# Patient Record
Sex: Male | Born: 1990 | Race: Black or African American | Hispanic: No | Marital: Single | Smoking: Never smoker
Health system: Southern US, Community
[De-identification: ages and names within clinical notes are randomized; demographics above are authoritative.]

## PROBLEM LIST (undated history)

## (undated) DIAGNOSIS — F319 Bipolar disorder, unspecified: Secondary | ICD-10-CM

## (undated) DIAGNOSIS — F32A Depression, unspecified: Secondary | ICD-10-CM

## (undated) DIAGNOSIS — F419 Anxiety disorder, unspecified: Secondary | ICD-10-CM

## (undated) DIAGNOSIS — F909 Attention-deficit hyperactivity disorder, unspecified type: Secondary | ICD-10-CM

## (undated) DIAGNOSIS — I2699 Other pulmonary embolism without acute cor pulmonale: Secondary | ICD-10-CM

## (undated) DIAGNOSIS — F329 Major depressive disorder, single episode, unspecified: Secondary | ICD-10-CM

## (undated) DIAGNOSIS — T7840XA Allergy, unspecified, initial encounter: Secondary | ICD-10-CM

## (undated) DIAGNOSIS — Q796 Ehlers-Danlos syndrome, unspecified: Secondary | ICD-10-CM

## (undated) DIAGNOSIS — F988 Other specified behavioral and emotional disorders with onset usually occurring in childhood and adolescence: Secondary | ICD-10-CM

## (undated) DIAGNOSIS — J45909 Unspecified asthma, uncomplicated: Secondary | ICD-10-CM

## (undated) DIAGNOSIS — M1990 Unspecified osteoarthritis, unspecified site: Secondary | ICD-10-CM

## (undated) HISTORY — PX: NO PAST SURGERIES: SHX2092

## (undated) HISTORY — DX: Unspecified asthma, uncomplicated: J45.909

## (undated) HISTORY — DX: Attention-deficit hyperactivity disorder, unspecified type: F90.9

## (undated) HISTORY — DX: Anxiety disorder, unspecified: F41.9

## (undated) HISTORY — DX: Depression, unspecified: F32.A

## (undated) HISTORY — DX: Allergy, unspecified, initial encounter: T78.40XA

## (undated) HISTORY — DX: Major depressive disorder, single episode, unspecified: F32.9

## (undated) HISTORY — DX: Other specified behavioral and emotional disorders with onset usually occurring in childhood and adolescence: F98.8

---
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
## 2009-05-15 ENCOUNTER — Telehealth: Payer: Self-pay | Admitting: Family Medicine

## 2009-05-19 ENCOUNTER — Ambulatory Visit: Payer: Self-pay | Admitting: Family Medicine

## 2011-02-05 NOTE — H&P (Signed)
NAME:  [name], [name] NO.:  [id number]   MEDICAL RECORD NO.:  [id number]          PATIENT TYPE:  INP   LOCATION:  0205                          FACILITY:  [facility]   PHYSICIAN:  Lalla Brothers, MDDATE OF BIRTH:  1991-01-11   DATE OF ADMISSION:  08/16/2006  DATE OF DISCHARGE:                         PSYCHIATRIC ADMISSION ASSESSMENT   IDENTIFICATION:  Isaiah Garza is a 20 year old male, 11th grade student at [employer], admitted emergently voluntarily in transfer from [facility] Emergency Department for inpatient stabilization and treatment of  psychosis, dangerous to self and unable to meet his basic needs.  The  patient is overwhelmed, failing to sleep for the last two days as he  continues to attempt to understand and solve the delusional doubt extending  from identity to basic truth and safety.  He is self-defeating in this  regard and has been treating himself with cannabis, reporting that he  attempted to stop for father for a week, apparently unsuccessful.  His urine  drug screen is positive for cannabis on admission and he denies other drugs  initially.  He has been noncompliant with his Risperdal from Dr.  Elsie Saas for the last two weeks, being in treatment since the spring of  2007.   HISTORY OF PRESENT ILLNESS:  The patient cannot complete a conversation to  the point of answering fully questions about problems.  He is appreciative  that others are attempting to help but he becomes confused as he attempts to  continue clarification of signs or symptoms.  Patient has a history of ADHD  but is on no specific treatment for that at this time.  He had bed-wetting  until age 45.  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 Colen Darling.  However, he is  obviously unable to function at school currently.  He reports recent panic  anxiety with limited clarification of degree in  consequences.  His mood has  been inappropriate and father indicates that he has had an outpatient  diagnosis of bipolar mania.  The patient presents in somewhat of a partially  treated state, having received Risperdal in the past.  The family reports  that the Risperdal has been dosed as 20 mg in the morning and 10 mg at night  though they are likely referring to 2 mg in the morning and 1 mg at night.  The patient had no definite side effects from the medication and family  seems to think it was helping though he became noncompliant as he began  questioning the extent and degree of need.  He has had no organic central  nervous system trauma.  He asked for help sleeping.  He has had suicidal  ideation in the past, wanting to be dead.  He does not acknowledge homicidal  ideation.  They do not clarify the extent of treatment with Dr. Elsie Saas  at [phone].  Cannabis has been daily, trying to quit for father for the  last week.  There is no other known psychic trauma.  He denies flashbacks,  character disruption or alter identities.  There has been no definite  association or time lapse.  However, he cannot clarify the definite details  of the course of delusions but does indicate that he has some voices at  times also.   PAST MEDICAL HISTORY:  He is under the primary care of Dr. Benard Halsted.  The patient has assumed a vegetarian status.  He had a CT scan in the ED  that was negative at the time of transfer.  Labs were otherwise negative  except for the urine drug screen being positive for cannabis.  He was  delivered at [redacted] weeks gestation at 5 pounds 2 ounces without complications  known.  He had allergic rhinitis and asthma in the past though without  relapse of asthma in years.  He has contact lenses for light sensitivity.  He fell several years ago but does not clarify the reason except to state  that he has lax joints and sometimes takes Naprosyn for joint pain.  He has  marfanoid  features.  He has orthodontic braces.  He has acne of the upper  back.  He has multiple nevi.  He is allergic to DUST and ANIMAL DANDER.  He  had bed-wetting until age 85.  He has had no seizure or syncope.  There has  been no heart murmur or arrhythmia.  He has no medication allergies.   REVIEW OF SYSTEMS:  The patient denies difficulty with gait, gaze or  continence.  He denies exposure to communicable disease or toxins.  He  denies headache or sensory loss otherwise.  There is no memory loss or  coordination difficulty.  There is no cough, congestion, dyspnea or chest  pain currently.  There is no abdominal pain, nausea, vomiting or diarrhea.  There is no dysuria or arthralgia.   IMMUNIZATIONS:  Up-to-date.   FAMILY HISTORY:  Great-aunt completed suicide.  The patient lives with both  parents, maternal grandmother, age 85, and 22 year old sister.  The father  reports a family history of bipolar disorder but is not more specific.  The  patient declines the support parents offer.  Father seems somewhat  disorganized as he attempts to help the patient initially though all are  sleep deprived.   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  He enjoys drama and dance including at church.  He has  been using cannabis even daily.  He denies other drugs at this time.  He  denies legal consequences.  He does not answer questions about sexual  activity.   ASSETS:  The patient has preserved intent and interest as well as  motivation.   MENTAL STATUS EXAM:  Height is 73-1/2 inches and weight is 56-1/2 kg.  Blood  pressure is 142/89 with heart rate of 78 (sitting) and 147/96 with heart  rate of 96 (standing).  He is right-handed.  He is alert and somewhat  vigilant.  He is over-sensitive and easily anxious.  His mood is labile but  not fixated in mania or depression though he tends toward more elevated mood historically.  He presents poorly  formed delusions about opening a door into  his subconscious that cannot be closed.  He goes over and over through  intrapsychic processing the consequences and never-ending mechanism.  He  states he is attempting to understand himself and his life and is getting  more and more confused.  He does not present frank organicity.  There is no  definite dissociation.  He does not present definite Garza-traumatic stress.  He does have vague auditory hallucinations at times.  He has had suicidal  ideation in the past.  He is not able to meet his basic needs currently and  his delusions render him at risk for both physical as well as emotional  collapse and injury.   IMPRESSION:  AXIS I:  Psychotic disorder not otherwise specified.  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  Generalized anxiety disorder.  History of  attention-deficit hyperactivity disorder not otherwise specified.  History  of functional nocturnal enuresis.  Parent-child problem.  Other specified  family circumstances.  AXIS II:  Diagnosis deferred.  AXIS III:  Marfanoid features with lax joints, sometimes painful and a  history of at least one fall, orthodontics, acne, photosensitivity, history  of allergic rhinitis and asthma, especially to DUST and ANIMAL DANDER.  AXIS IV:  Stressors:  Family--moderate, acute and chronic; school--moderate,  acute and chronic; phase of life--severe, acute and chronic.  AXIS V:  GAF on admission 27; highest in last year 74.   PLAN:  The patient is admitted for inpatient adolescent psychiatric and  multidisciplinary multimodal behavioral health treatment in a team-based  program at a locked psychiatric unit.  Will increase Risperdal to 2 mg in  the morning and 4 mg at bedtime.  Ativan is processed with father and he  agrees to 1-2 mg every four hours as needed for agitation, insomnia or  anxiety.  Cognitive behavioral therapy, anger management, desensitization,  identity consolidation, substance abuse  intervention, family therapy, ego  analytic therapy, school or social and communication skills will be  undertaken.   ESTIMATED LENGTH OF STAY:  Six to seven days with target symptoms for  discharge being stabilization of psychosis with reestablishment of the  capacity to meet basic needs, stabilization of safe, effective behavior and  generalization of the capacity for safe, effective participation in  outpatient treatment.      Lalla Brothers, MD  Electronically Signed     GEJ/MEDQ  D:  08/16/2006  T:  08/16/2006  Job:  [id number]

## 2011-02-05 NOTE — Discharge Summary (Signed)
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  The patient  had been unable to sleep for 48 hours and was becoming progressively  decompensated such that father was seeking assistance with his sleep in  the emergency department.  The patient had reportedly contracted with  father to stop cannabis for a week but had been unsuccessful.  He was  noncompliant with Risperdal from Dr. Elsie Saas for two weeks though  the medication had been helping since Aprilof 2007.  For full  details, please see the typed admission assessment.   SYNOPSIS OF PRESENT ILLNESS:  The patient resides with both parents,  sister, and maternal grandmother who is 63 years of age.  Father  anxiously enables the patient's disruptive behavior while mother is more  consistent affectively, particularly for recognizing the patient's  needs.  Mother recognized the patient does not intend to stop cannabis.  They report epiphanies referring to the patient's delusional fixation  and having unlocked the door to his subconscious and being unable to  close the door again.  The patient reports a continuous stream of  thought with each subsequent thought doubting the previous thought so  that his concern never ends.  The patient does not trust himself but  does not trust others.  He  maintains entitlement to smoke cannabis daily  as he has been.  Great-aunt committed suicide.  Bipolar disorder runs in  maternal grandmother and maternal aunt and there are many maternal  relatives with alcoholism.  Father seems anxious though enabling while  mother is avoidant though sincere about the patient needing to change.  The patient erroneously reports taking 20 mg of Risperdal in the morning  and 10 mg at night though he likely seems to suggest that most recently  he has taken the Risperdal as 1 mg morning and night.  CT scan in the ED  was negative at the time of transfer and laboratory testing was positive  only for cannabis.  He was premature birth at 102 weeks gestation,  weighing 5 pounds 2 ounces.  He has orthodontic braces and lax joints  with a marfanoid habitus.  He has orthodontic braces and is allergic to  DUST and ANIMAL DANDER.  He had bed-wetting until age 58.   INITIAL MENTAL STATUS EXAM:  The patient was oversensitive and easily  anxious.  He is right-handed.  He has poorly formed delusions but cannot  disengage from them.  He does not tolerate confrontation and  becomes  sleepless and over-agitated.  He has suicidal ideation in the past and  currently cannot meet his own basic needs, being at risk from his  delusions for both physical and emotional collapse and injury.   LABORATORY FINDINGS:  Venous pH was 7.361 with bicarbonate elevated at  28.  Hemoglobin was normal at 15.5, total white count 6900, MCV of 86  and platelet count 259,000 after hemoglobin was slightly elevated in the  ED at 16.7.  Urine drug screen was positive for tetrahydrocannabinol;  otherwise negative.  Blood alcohol was negative.  Alkaline phosphatase  was 183 with upper limit of normal 171 for adult norms suggesting some  remaining growth.  AST was normal at 19, ALT 11, total bilirubin at 1  and albumin 4.1.  Creatinine was normal at 0.8, sodium 140, potassium  3.3, slightly low, with  random glucose 108 and GGT 15.  Urinalysis was  normal with specific gravity of 1.027 except protein of 30 mg/dL in a  concentrated specimen and pH 6.5 with 0-2 wbc and rbc and few sperm  present.  Free T4 was normal at 1.39 and TSH at 1.896.  10-hour fasting  lipid panel revealed total cholesterol normal at 121, HDL 55, LDL 60 and  triglyceride 28.  Urine probe for gonorrhea and chlamydia trachomatis by  DNA amplification were both negative.  RPR was nonreactive.  Four days  prior to discharge, repeat laboratory testing on Risperdal 6 mg daily  when the patient had some mild parkinsonian hypokinesia and bradykinesia  as well as some orthostatic dizziness and drowsiness, laboratory testing  was intact except fasting glucose was 108 with upper limit of normal 99  and BUN 5 with lower limit of normal 6.  Sodium was normal at 140,  potassium 4.1, creatinine 0.91, calcium 9.8, albumin 4.1, AST 17 and ALT  12.  Hemoglobin A1C was normal at 5.8% with upper limit of normal 6.1  and lipase was normal at 25.  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   HOSPITAL COURSE AND TREATMENT:  General medical exam by Jorje Guild PA-C  noted animal dander with a possible concussion at age 39.  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  The patient reported maternal aunt has bipolar  and father depression.  There is a family history of substance abuse but  they do not provide specifics.  Marfanoid features were evident with lax  joints.  He reports a history of some vague blackouts and clumsiness.  He denies sexual activity.  Vital signs were normal throughout hospital  stay with height 73-1/2 inches and weight of 56-1/2 kg on admission. Initial blood  pressure was 142/89 with heart rate of 78 (sitting) and  147/96 with heart rate of 96 (standing).  Discharge weight was 56 kg and  discharge blood pressure 112/67 with heart rate of 82 (supine) and  121/70 with heart rate of 104 (standing).  The patient was inhibited and  passively resistant in the treatment program.  As he stayed to himself,  he became even more overwhelmed and infatuated with his delusions.  As  Risperdal was titrated up, the delusions began to clear but the patient  also had some cognitive slowing that parents disapproved of  as well as  some hypokinesia and bradykinesia.  On lower doses of Risperdal, he  could not sleep but he would not ask for Ativan regularly to facilitate  sleep thought it did help early in the course of the hospital stay.  Ultimately, a fixed schedule of medication was concluded with Risperdal  3 mg at bedtime, Cogentin 2 mg, and Ativan 2 mg.  Over the final three  days of hospital stay, the patient became more appropriate in all  aspects of care including with family.  He remained resistant to  cessation of marijuana but father became more capable of actively  clarifying parental expectations that he be sober and follow the rules  and the law.  Mother clarified the need for random drug screens and the  family plan for such though the patient did not like this.  Substance  abuse interventions and family therapy was carried out during the  hospital stay.  Ensure nutritional supplements were undertaken  particularly as the patient is vegetarian.  He did make progress in his  nutrition and in his behavior.  He wrote parents a letter explaining his  thoughts as having too much going on to explain.  Parents wanted early  discharge, especially father though it did allow expectations that the  patient become compliant and interactive to prepare for such.  Extrapyramidal side effects were significantly reduced as was anxiety by  the time of discharge.  He  still had some delusional fixations but was  not floridly disorganized or acting upon such in his behavior.  He  required no seclusion or restraint during the hospital stay.  He  manifested more anxiety and brief psychotic disorder than mania during  the hospital stay.   FINAL DIAGNOSES:  AXIS I:  Brief psychotic disorder.  Generalized  anxiety disorder.  Cannabis abuse with possible dependence.  Parent-  child problem.  Other specified family circumstances.  Other  interpersonal problems.  History of functional nocturnal enuresis.  History of attention-deficit hyperactivity disorder not otherwise  specified.  Noncompliance with treatment.  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  AXIS IV:  Stressors:  Family--moderate, acute and chronic; school--  moderate, acute and chronic; phase of life--severe, acute and chronic.  AXIS V:  GAF on admission 27; highest in last year 74; discharge GAF 46.   CONDITION ON DISCHARGE:  The patient was discharged to father in  improved condition following a weight gain refeeding diet.  He has no  restrictions on physical activity and crisis and safety plans are  outlined if needed.  Return to school is contingent upon further  recovery despite stressors of return to the community and the family  environment.  He is discharged on the following medication.   DISCHARGE MEDICATIONS:  1. Risperdal 3 mg tablet every bedtime; quantity #30 with no refill       prescribed.  2. Cogentin 2 mg tablet every bedtime; quantity #30 with no refill.  3. Ativan 2 mg tablet every bedtime; quantity #30 with no refill  prescribed.   FOLLOWUP:  He has aftercare psychiatric follow-up with Dr. Elsie Saas  at [facility] September 02, 2006 at 1645.  Community support referrals are outlined for [facility] at [phone] or [facility] of the [place] at [phone].  Random urine drug screens are outlined though needing to be  finalized with aftercare therapy center or physician in order for  results of the reports to be implemented.  The patient and father  understand the expectation of absolute sobriety.      Lalla Brothers, MD  Electronically Signed     GEJ/MEDQ  D:  08/26/2006  T:  08/26/2006  Job:  [id number]

## 2012-12-20 ENCOUNTER — Telehealth: Payer: Self-pay | Admitting: Family Medicine

## 2012-12-20 NOTE — Telephone Encounter (Signed)
Per patients mother he would like to switch from Dr. Tawanna Cooler to [facility], Please advise

## 2012-12-21 NOTE — Telephone Encounter (Signed)
Fleet Contras please call it's fine if he switches to another physician with his medical problems I recommend he have a physician as his primary care doctor

## 2012-12-21 NOTE — Telephone Encounter (Signed)
Ok with me 

## 2012-12-26 NOTE — Telephone Encounter (Signed)
Left message for patient to call back and schedule with [facility]

## 2012-12-29 NOTE — Telephone Encounter (Signed)
Left message for pt to call back and schedule

## 2013-04-23 ENCOUNTER — Emergency Department (HOSPITAL_COMMUNITY)
Admission: EM | Admit: 2013-04-23 | Discharge: 2013-04-23 | Disposition: A | Discharge status: Home / Self Care | Payer: BC Managed Care – PPO | Attending: Emergency Medicine | Admitting: Emergency Medicine

## 2013-04-23 ENCOUNTER — Emergency Department (HOSPITAL_COMMUNITY): Payer: BC Managed Care – PPO

## 2013-04-23 ENCOUNTER — Encounter: Payer: Self-pay | Admitting: Internal Medicine

## 2013-04-23 ENCOUNTER — Encounter (HOSPITAL_COMMUNITY): Payer: Self-pay

## 2013-04-23 ENCOUNTER — Telehealth: Payer: Self-pay | Admitting: Family Medicine

## 2013-04-23 ENCOUNTER — Ambulatory Visit (INDEPENDENT_AMBULATORY_CARE_PROVIDER_SITE_OTHER): Payer: BC Managed Care – PPO | Admitting: Internal Medicine

## 2013-04-23 VITALS — BP 112/72 | HR 111 | Temp 98.8°F | Wt 146.8 lb

## 2013-04-23 DIAGNOSIS — S0101XA Laceration without foreign body of scalp, initial encounter: Secondary | ICD-10-CM

## 2013-04-23 DIAGNOSIS — Z87891 Personal history of nicotine dependence: Secondary | ICD-10-CM | POA: Insufficient documentation

## 2013-04-23 DIAGNOSIS — W1692XA Jumping or diving into unspecified water causing other injury, initial encounter: Secondary | ICD-10-CM | POA: Insufficient documentation

## 2013-04-23 DIAGNOSIS — S0191XD Laceration without foreign body of unspecified part of head, subsequent encounter: Secondary | ICD-10-CM

## 2013-04-23 DIAGNOSIS — Z79899 Other long term (current) drug therapy: Secondary | ICD-10-CM | POA: Insufficient documentation

## 2013-04-23 DIAGNOSIS — M542 Cervicalgia: Secondary | ICD-10-CM

## 2013-04-23 DIAGNOSIS — S060X9A Concussion with loss of consciousness of unspecified duration, initial encounter: Secondary | ICD-10-CM

## 2013-04-23 DIAGNOSIS — Z5189 Encounter for other specified aftercare: Secondary | ICD-10-CM

## 2013-04-23 DIAGNOSIS — Y9319 Activity, other involving water and watercraft: Secondary | ICD-10-CM | POA: Insufficient documentation

## 2013-04-23 DIAGNOSIS — Y9239 Other specified sports and athletic area as the place of occurrence of the external cause: Secondary | ICD-10-CM | POA: Insufficient documentation

## 2013-04-23 DIAGNOSIS — S0990XA Unspecified injury of head, initial encounter: Secondary | ICD-10-CM

## 2013-04-23 DIAGNOSIS — R112 Nausea with vomiting, unspecified: Secondary | ICD-10-CM | POA: Insufficient documentation

## 2013-04-23 DIAGNOSIS — S0100XA Unspecified open wound of scalp, initial encounter: Secondary | ICD-10-CM | POA: Insufficient documentation

## 2013-04-23 LAB — APTT: aPTT: 23 seconds — ABNORMAL LOW (ref 24–37)

## 2013-04-23 LAB — BASIC METABOLIC PANEL
BUN: 12 mg/dL (ref 6–23)
CO2: 23 mEq/L (ref 19–32)
Chloride: 101 mEq/L (ref 96–112)
Glucose, Bld: 142 mg/dL — ABNORMAL HIGH (ref 70–99)
Potassium: 3.2 mEq/L — ABNORMAL LOW (ref 3.5–5.1)
Sodium: 138 mEq/L (ref 135–145)

## 2013-04-23 LAB — CBC WITH DIFFERENTIAL/PLATELET
Hemoglobin: 15.8 g/dL (ref 13.0–17.0)
Lymphocytes Relative: 43 % (ref 12–46)
Lymphs Abs: 3.8 10*3/uL (ref 0.7–4.0)
MCH: 28.3 pg (ref 26.0–34.0)
Monocytes Relative: 7 % (ref 3–12)
Neutro Abs: 4.3 10*3/uL (ref 1.7–7.7)
Neutrophils Relative %: 48 % (ref 43–77)
Platelets: 286 10*3/uL (ref 150–400)
RBC: 5.58 MIL/uL (ref 4.22–5.81)
WBC: 8.8 10*3/uL (ref 4.0–10.5)

## 2013-04-23 LAB — PROTIME-INR
INR: 0.92 (ref 0.00–1.49)
Prothrombin Time: 12.2 seconds (ref 11.6–15.2)

## 2013-04-23 MED ORDER — ONDANSETRON HCL 4 MG/2ML IJ SOLN
4.0000 mg | Freq: Once | INTRAMUSCULAR | Status: AC
  Administered 2013-04-23: 4 mg via INTRAVENOUS

## 2013-04-23 MED ORDER — TRAMADOL HCL 50 MG PO TABS: 50.0000 mg | ORAL_TABLET | Freq: Three times a day (TID) | ORAL | Status: DC | PRN

## 2013-04-23 MED ORDER — CYCLOBENZAPRINE HCL 10 MG PO TABS: 10.0000 mg | ORAL_TABLET | Freq: Three times a day (TID) | ORAL | Status: DC | PRN

## 2013-04-23 NOTE — Patient Instructions (Signed)
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
## 2013-04-23 NOTE — Telephone Encounter (Signed)
Pt is requesting to switch from Dr. Tawanna Cooler to [facility].

## 2013-04-23 NOTE — Telephone Encounter (Signed)
Ok with me 

## 2013-04-23 NOTE — Addendum Note (Signed)
Addended by: Lorre Munroe on: 04/23/2013 01:47 PM   Modules accepted: Orders
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
## 2013-04-23 NOTE — Telephone Encounter (Signed)
Ok with Dr. Tawanna Cooler per April 2 phone note.

## 2013-05-02 ENCOUNTER — Ambulatory Visit (INDEPENDENT_AMBULATORY_CARE_PROVIDER_SITE_OTHER): Payer: BC Managed Care – PPO | Admitting: Internal Medicine

## 2013-05-02 ENCOUNTER — Encounter: Payer: Self-pay | Admitting: Internal Medicine

## 2013-05-02 VITALS — BP 122/80 | HR 88 | Temp 97.8°F | Wt 148.4 lb

## 2013-05-02 DIAGNOSIS — Z4802 Encounter for removal of sutures: Secondary | ICD-10-CM

## 2013-05-02 DIAGNOSIS — S0191XD Laceration without foreign body of unspecified part of head, subsequent encounter: Secondary | ICD-10-CM

## 2013-05-02 DIAGNOSIS — Z0000 Encounter for general adult medical examination without abnormal findings: Secondary | ICD-10-CM

## 2013-05-02 DIAGNOSIS — Z5189 Encounter for other specified aftercare: Secondary | ICD-10-CM

## 2013-05-02 NOTE — Patient Instructions (Signed)
Suture Removal  Your caregiver has removed your sutures today. If skin adhesive strips were applied at the time of suturing, or applied following removal of the sutures today, they will begin to peel off in a couple more days. If skin adhesive strips remain after 14 days, they may be removed.  HOME CARE INSTRUCTIONS    Change any bandages (dressings) at least once a day or as directed by your caregiver. If the bandage sticks, soak it off with warm, soapy water.   Wash the area with soap and water to remove all the cream or ointment (if you were instructed to use any) 2 times a day. Rinse off the soap and pat the area dry with a clean towel.   Reapply cream or ointment as directed by your caregiver. This will help prevent infection and keep the bandage from sticking.   Keep the wound area dry and clean. If the bandage becomes wet, dirty, or develops a bad smell, change it as soon as possible.   Only take over-the-counter or prescription medicines for pain, discomfort, or fever as directed by your caregiver.   Use sunscreen when out in the sun. New scars become sunburned easily.   Return to your caregivers office in in 7 days or as directed to have your sutures removed.  You may need a tetanus shot if:   You cannot remember when you had your last tetanus shot.   You have never had a tetanus shot.   The injury broke your skin.  If you got a tetanus shot, your arm may swell, get red, and feel warm to the touch. This is common and not a problem. If you need a tetanus shot and you choose not to have one, there is a rare chance of getting tetanus. Sickness from tetanus can be serious.  SEEK IMMEDIATE MEDICAL CARE IF:    There is redness, swelling, or increasing pain in the wound.   Pus is coming from the wound.   An unexplained oral temperature above 102 F (38.9 C) develops.   You notice a bad smell coming from the wound or dressing.   The wound breaks open (edges not staying together) after sutures have  been removed.  Document Released: 06/01/2001 Document Revised: 11/29/2011 Document Reviewed: 07/31/2007  ExitCare Patient Information 2014 [employer].

## 2013-05-02 NOTE — Progress Notes (Signed)
HPI  Pt presents to the clinic today to establish care. He is transferring care from Dr. Nelida Meuse. He has some concerns about a nodule on his ear. It just popped up. It is tender but is not overly painful.  Flu: never Tetanus: UTD Dentist: yearly  Past Medical History  Diagnosis Date  . Allergy   . Asthma     Current Outpatient Prescriptions  Medication Sig Dispense Refill  . amphetamine-dextroamphetamine (ADDERALL) 30 MG tablet Take 15-30 mg by mouth 2 (two) times daily. Pt stated usually only takes 15 mg    But has not been taking it regularly      . traMADol (ULTRAM) 50 MG tablet Take 1 tablet (50 mg total) by mouth every 8 (eight) hours as needed for pain.  30 tablet  0   No current facility-administered medications for this visit.    No Known Allergies  Family History  Problem Relation Age of Onset  . Cancer Father     testicular  . Diabetes Neg Hx   . Heart disease Neg Hx   . Early death Neg Hx   . Stroke Neg Hx     History   Social History  . Marital Status: Married    Spouse Name: N/A    Number of Children: N/A  . Years of Education: N/A   Occupational History  . Not on file.   Social History Main Topics  . Smoking status: Former Games developer  . Smokeless tobacco: Not on file  . Alcohol Use: 1.2 oz/week    1 Cans of beer, 1 Shots of liquor per week     Comment: social  . Drug Use: No  . Sexual Activity: Yes   Other Topics Concern  . Not on file   Social History Narrative  . No narrative on file    ROS:  Constitutional: Denies fever, malaise, fatigue, headache or abrupt weight changes.  HEENT: Denies eye pain, eye redness, ear pain, ringing in the ears, wax buildup, runny nose, nasal congestion, bloody nose, or sore throat. Respiratory: Denies difficulty breathing, shortness of breath, cough or sputum production.   Cardiovascular: Denies chest pain, chest tightness, palpitations or swelling in the hands or feet.  Gastrointestinal: Denies abdominal  pain, bloating, constipation, diarrhea or blood in the stool.  GU: Denies frequency, urgency, pain with urination, blood in urine, odor or discharge. Musculoskeletal: Denies decrease in range of motion, difficulty with gait, muscle pain or joint pain and swelling.  Skin: Pt reports lac to head. Denies redness, rashes, lesions or ulcercations.  Neurological: Denies dizziness, difficulty with memory, difficulty with speech or problems with balance and coordination.   No other specific complaints in a complete review of systems (except as listed in HPI above).  PE:  BP 122/80  Pulse 88  Temp(Src) 97.8 F (36.6 C) (Oral)  Wt 148 lb 6.4 oz (67.314 kg)  BMI 18.55 kg/m2  SpO2 98% Wt Readings from Last 3 Encounters:  05/02/13 148 lb 6.4 oz (67.314 kg)  04/23/13 146 lb 12.8 oz (66.588 kg)  05/19/09 129 lb (58.514 kg) (12%*, Z = -1.16)   * Growth percentiles are based on CDC 2-20 Years data.    General: Appears his stated age, well developed, well nourished in NAD. Skin: large head lac, healed, with 15 sutures and 12 staples. HEENT: Head: normal shape and size; Eyes: sclera white, no icterus, conjunctiva pink, PERRLA and EOMs intact; Ears: Tm's gray and intact, normal light reflex, elastotic nodule of right pinna;  Nose: mucosa pink and moist, septum midline; Throat/Mouth: Teeth present, mucosa pink and moist, no lesions or ulcerations noted.  Neck: Normal range of motion. Neck supple, trachea midline. No massses, lumps or thyromegaly present.  Cardiovascular: Normal rate and rhythm. S1,S2 noted.  No murmur, rubs or gallops noted. No JVD or BLE edema. No carotid bruits noted. Pulmonary/Chest: Normal effort and positive vesicular breath sounds. No respiratory distress. No wheezes, rales or ronchi noted.  Abdomen: Soft and nontender. Normal bowel sounds, no bruits noted. No distention or masses noted. Liver, spleen and kidneys non palpable. Musculoskeletal: Normal range of motion. No signs of  joint swelling. No difficulty with gait.  Neurological: Alert and oriented. Cranial nerves II-XII intact. Coordination normal. +DTRs bilaterally. Psychiatric: Mood and affect normal. Behavior is normal. Judgment and thought content normal.     BMET    Component Value Date/Time   NA 138 04/23/2013 0401   K 3.2* 04/23/2013 0401   CL 101 04/23/2013 0401   CO2 23 04/23/2013 0401   GLUCOSE 142* 04/23/2013 0401   BUN 12 04/23/2013 0401   CREATININE 0.73 04/23/2013 0401   CALCIUM 9.1 04/23/2013 0401   GFRNONAA >90 04/23/2013 0401   GFRAA >90 04/23/2013 0401    Lipid Panel  No results found for this basename: chol, trig, hdl, cholhdl, vldl, ldlcalc    CBC    Component Value Date/Time   WBC 8.8 04/23/2013 0401   RBC 5.58 04/23/2013 0401   HGB 15.8 04/23/2013 0401   HCT 46.2 04/23/2013 0401   PLT 286 04/23/2013 0401   MCV 82.8 04/23/2013 0401   MCH 28.3 04/23/2013 0401   MCHC 34.2 04/23/2013 0401   RDW 14.6 04/23/2013 0401   LYMPHSABS 3.8 04/23/2013 0401   MONOABS 0.6 04/23/2013 0401   EOSABS 0.1 04/23/2013 0401   BASOSABS 0.0 04/23/2013 0401    Hgb A1C No results found for this basename: HGBA1C     Assessment and Plan:  Preventative Health Maintenance:  All HM UTD Pt declines lipid screening today  Head lac:  15 sutrues removed, 12 staples removed, are cleaned and bacitracin applied

## 2015-01-16 ENCOUNTER — Ambulatory Visit (INDEPENDENT_AMBULATORY_CARE_PROVIDER_SITE_OTHER): Payer: BC Managed Care – PPO | Admitting: Emergency Medicine

## 2015-01-16 VITALS — BP 110/70 | HR 68 | Temp 98.1°F | Resp 16 | Ht 72.0 in | Wt 150.6 lb

## 2015-01-16 DIAGNOSIS — Z0000 Encounter for general adult medical examination without abnormal findings: Secondary | ICD-10-CM | POA: Diagnosis not present

## 2015-01-16 DIAGNOSIS — Z202 Contact with and (suspected) exposure to infections with a predominantly sexual mode of transmission: Secondary | ICD-10-CM | POA: Diagnosis not present

## 2015-01-16 LAB — CBC WITH DIFFERENTIAL/PLATELET
Basophils Absolute: 0 10*3/uL (ref 0.0–0.1)
Basophils Relative: 0 % (ref 0–1)
EOS ABS: 0.1 10*3/uL (ref 0.0–0.7)
Eosinophils Relative: 1 % (ref 0–5)
HEMATOCRIT: 47.5 % (ref 39.0–52.0)
HEMOGLOBIN: 16.4 g/dL (ref 13.0–17.0)
LYMPHS ABS: 1.3 10*3/uL (ref 0.7–4.0)
LYMPHS PCT: 21 % (ref 12–46)
MCH: 30.1 pg (ref 26.0–34.0)
MCHC: 34.5 g/dL (ref 30.0–36.0)
MCV: 87.2 fL (ref 78.0–100.0)
MONOS PCT: 7 % (ref 3–12)
MPV: 9.5 fL (ref 8.6–12.4)
Monocytes Absolute: 0.4 10*3/uL (ref 0.1–1.0)
NEUTROS PCT: 71 % (ref 43–77)
Neutro Abs: 4.3 10*3/uL (ref 1.7–7.7)
Platelets: 285 10*3/uL (ref 150–400)
RBC: 5.45 MIL/uL (ref 4.22–5.81)
RDW: 14 % (ref 11.5–15.5)
WBC: 6 10*3/uL (ref 4.0–10.5)

## 2015-01-16 LAB — LIPID PANEL
CHOLESTEROL: 151 mg/dL (ref 0–200)
HDL: 63 mg/dL (ref >=40)
LDL Cholesterol: 79 mg/dL (ref 0–99)
Total CHOL/HDL Ratio: 2.4 Ratio
Triglycerides: 43 mg/dL (ref <150)
VLDL: 9 mg/dL (ref 0–40)

## 2015-01-16 LAB — COMPREHENSIVE METABOLIC PANEL
ALT: 17 U/L (ref 0–53)
AST: 21 U/L (ref 0–37)
Albumin: 5.1 g/dL (ref 3.5–5.2)
Alkaline Phosphatase: 47 U/L (ref 39–117)
BILIRUBIN TOTAL: 1.1 mg/dL (ref 0.2–1.2)
BUN: 16 mg/dL (ref 6–23)
CO2: 28 meq/L (ref 19–32)
CREATININE: 0.88 mg/dL (ref 0.50–1.35)
Calcium: 10.1 mg/dL (ref 8.4–10.5)
Chloride: 102 mEq/L (ref 96–112)
GLUCOSE: 87 mg/dL (ref 70–99)
POTASSIUM: 4 meq/L (ref 3.5–5.3)
SODIUM: 140 meq/L (ref 135–145)
Total Protein: 7.9 g/dL (ref 6.0–8.3)

## 2015-01-16 MED ORDER — DOXYCYCLINE HYCLATE 100 MG PO CAPS: 100.0000 mg | ORAL_CAPSULE | Freq: Two times a day (BID) | ORAL | Status: DC

## 2015-01-16 NOTE — Addendum Note (Signed)
Addended by: Johnnette LitterARDWELL, Carolin Quang M on: 01/16/2015 03:41 PM   Modules accepted: Kipp BroodSmartSet

## 2015-01-16 NOTE — Progress Notes (Signed)
[facility] [address], [place] [address] [phone]  Date:  01/16/2015   Name:  Isaiah NeighborsCharles A Everman   DOB:  28-Apr-1991   MRN:  [id number]  PCP:  Nicki ReaperBAITY, REGINA, NP    Chief Complaint: Annual Exam   History of Present Illness:  Isaiah NeighborsCharles A Blazier is a 24 y.o. very pleasant male patient who presents with the following:  Patient comes for annual examination. 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  Denies other complaint or health concern today.    There are no active problems to display for this patient.   Past Medical History  Diagnosis Date  . Allergy   . Asthma   . ADD (attention deficit disorder)     No past surgical history on file.  History  Substance Use Topics  . Smoking status: Former Games developermoker  . Smokeless tobacco: Not on file  . Alcohol Use: 1.2 oz/week    1 Cans of beer, 1 Shots of liquor per week     Comment: social - only about 3 drinks    Family History  Problem Relation Age of Onset  . Cancer Father     testicular  . Diabetes Neg Hx   . Heart disease Neg Hx   . Early death Neg Hx   . Stroke Neg Hx     No Active Allergies  Medication list has been reviewed and updated.  Current Outpatient Prescriptions on File Prior to Visit  Medication Sig Dispense Refill  . amphetamine-dextroamphetamine (ADDERALL) 30 MG tablet Take 15-30 mg by mouth 2 (two) times daily. Pt stated usually only takes 15 mg    But has not been taking it regularly    . traMADol (ULTRAM) 50 MG tablet Take 1 tablet (50 mg total) by mouth every 8 (eight) hours as needed for pain. (Patient not taking: Reported on 01/16/2015) 30 tablet 0   No current facility-administered medications on file prior to visit.    Review of Systems:  As per HPI, otherwise negative.    Physical Examination: Filed Vitals:   01/16/15 1439  BP: 110/70  Pulse:  68  Temp: 98.1 F (36.7 C)  Resp: 16   Filed Vitals:   01/16/15 1439  Height: 6' (1.829 m)  Weight: 150 lb 9.6 oz (68.312 kg)   Body mass index is 20.42 kg/(m^2). Ideal Body Weight: Weight in (lb) to have BMI = 25: 183.9  GEN: WDWN, NAD, Non-toxic, A & O x 3 HEENT: Atraumatic, Normocephalic. Neck supple. No masses, No LAD. Ears and Nose: No external deformity. CV: RRR, No M/G/R. No JVD. No thrill. No extra heart sounds. PULM: CTA B, no wheezes, crackles, rhonchi. No retractions. No resp. distress. No accessory muscle use. ABD: S, NT, ND, +BS. No rebound. No HSM. EXTR: No c/c/e NEURO Normal gait.  PSYCH: Normally interactive. Conversant. Not depressed or anxious appearing.  Calm demeanor.    Assessment and Plan: Annual examination STD screening sexual contact has chlamydia Doxycycline  Signed,  Phillips OdorJeffery Brandt Chaney, MD

## 2015-01-16 NOTE — Patient Instructions (Signed)
Chlamydia Chlamydia is an infection. It is spread through sexual contact. Chlamydia can be in different areas of the body. These areas include the urethra, throat, or rectum. It is important to treat chlamydia as soon as possible. It can damage other organs.  CAUSES  Chlamydia is caused by bacteria. It is a sexually transmitted disease. This means that it is passed from an infected partner during intimate contact. This contact could be with the genitals, mouth, or rectal area.  SIGNS AND SYMPTOMS  There may not be any symptoms. This is often the case early in the infection. If there are symptoms, they are usually mild and may only be noticeable in the morning. Symptoms you may notice include:   Burning with urination.  Pain or swelling in the testicles.  Watery mucus-like discharge from the penis.  Long-standing (chronic) pelvic pain after frequent infections.  Pain, swelling, or itching around the anus.  A sore throat.  Itching, burning, or redness in the eyes, or discharge from the eyes. DIAGNOSIS  To diagnose this infection, your health care provider will do a pelvic exam. A sample of urine or a swab from the rectum may be taken for testing.  TREATMENT  Chlamydia is treated with antibiotic medicines.  HOME CARE INSTRUCTIONS  Take your antibiotic medicine as directed by your health care provider. Finish the antibiotic even if you start to feel better. Incomplete treatment will put you at risk for not being able to have children (sterility).   Take medicines only as directed by your health care provider.   Rest.   Inform any sexual partners about your infection. Even if they are symptom free or have a negative culture or evaluation, they should be treated for the condition.   Do not have sex (intercourse) until treatment is completed and your health care provider says it is okay.   Keep all follow-up visits as directed by your health care provider.   Not all test results  are available during your visit. If your test results are not back during the visit, make an appointment with your health care provider to find out the results. Do not assume everything is normal if you have not heard from your health care provider or the medical facility. It is your responsibility to get your test results. SEEK MEDICAL CARE IF:  You develop new joint pain.  You have a fever. SEEK IMMEDIATE MEDICAL CARE IF:   Your pain increases.   You have abnormal discharge.   You have pain during intercourse. MAKE SURE YOU:   Understand these instructions.  Will watch your condition.  Will get help right away if you are not doing well or get worse. Document Released: 09/06/2005 Document Revised: 01/21/2014 Document Reviewed: 03/15/2013 ExitCare Patient Information 2015 [employer]. This information is not intended to replace advice given to you by your health care provider. Make sure you discuss any questions you have with your health care provider.  

## 2015-01-16 NOTE — Progress Notes (Deleted)
   Subjective:    Patient ID: Isaiah Garza, male    DOB: 02/22/1991, 24 y.o.   MRN: [id number]  HPI    Review of Systems  Constitutional: Positive for fatigue.  Eyes: Negative.   Respiratory: Positive for chest tightness, wheezing and stridor.   Cardiovascular: Negative.   Gastrointestinal: Negative.   Endocrine: Positive for heat intolerance.  Genitourinary: Negative.   Musculoskeletal: Positive for myalgias, back pain, arthralgias, neck pain and neck stiffness.  Skin: Negative.   Allergic/Immunologic: Positive for environmental allergies.  Neurological: Negative.   Hematological: Negative.   Psychiatric/Behavioral: Positive for sleep disturbance.       Objective:   Physical Exam        Assessment & Plan:

## 2015-01-17 LAB — RPR

## 2015-01-17 LAB — HIV ANTIBODY (ROUTINE TESTING W REFLEX): HIV: NONREACTIVE

## 2015-01-20 LAB — GC/CHLAMYDIA PROBE AMP
CT Probe RNA: NEGATIVE
GC Probe RNA: NEGATIVE

## 2016-08-08 DIAGNOSIS — F1220 Cannabis dependence, uncomplicated: Secondary | ICD-10-CM | POA: Insufficient documentation

## 2016-09-12 ENCOUNTER — Emergency Department (HOSPITAL_COMMUNITY)
Admission: EM | Admit: 2016-09-12 | Discharge: 2016-09-13 | Disposition: A | Discharge status: Other Acute Inpatient Hospital | Payer: BC Managed Care – PPO | Attending: Emergency Medicine | Admitting: Emergency Medicine

## 2016-09-12 ENCOUNTER — Encounter (HOSPITAL_COMMUNITY): Payer: Self-pay | Admitting: Emergency Medicine

## 2016-09-12 DIAGNOSIS — J45909 Unspecified asthma, uncomplicated: Secondary | ICD-10-CM | POA: Insufficient documentation

## 2016-09-12 DIAGNOSIS — N289 Disorder of kidney and ureter, unspecified: Secondary | ICD-10-CM | POA: Diagnosis not present

## 2016-09-12 DIAGNOSIS — F3110 Bipolar disorder, current episode manic without psychotic features, unspecified: Secondary | ICD-10-CM

## 2016-09-12 DIAGNOSIS — D751 Secondary polycythemia: Secondary | ICD-10-CM | POA: Insufficient documentation

## 2016-09-12 DIAGNOSIS — Z79899 Other long term (current) drug therapy: Secondary | ICD-10-CM | POA: Diagnosis not present

## 2016-09-12 DIAGNOSIS — F319 Bipolar disorder, unspecified: Secondary | ICD-10-CM | POA: Diagnosis not present

## 2016-09-12 DIAGNOSIS — F3113 Bipolar disorder, current episode manic without psychotic features, severe: Secondary | ICD-10-CM | POA: Diagnosis not present

## 2016-09-12 DIAGNOSIS — Z87891 Personal history of nicotine dependence: Secondary | ICD-10-CM | POA: Insufficient documentation

## 2016-09-12 DIAGNOSIS — F309 Manic episode, unspecified: Secondary | ICD-10-CM | POA: Diagnosis present

## 2016-09-12 HISTORY — DX: Bipolar disorder, unspecified: F31.9

## 2016-09-12 LAB — ETHANOL

## 2016-09-12 LAB — RAPID URINE DRUG SCREEN, HOSP PERFORMED
AMPHETAMINES: NOT DETECTED
BARBITURATES: NOT DETECTED
Benzodiazepines: NOT DETECTED
Cocaine: NOT DETECTED
Opiates: NOT DETECTED
Tetrahydrocannabinol: POSITIVE — AB

## 2016-09-12 LAB — COMPREHENSIVE METABOLIC PANEL
ALK PHOS: 75 U/L (ref 38–126)
ALT: 29 U/L (ref 17–63)
AST: 25 U/L (ref 15–41)
Albumin: 6.1 g/dL — ABNORMAL HIGH (ref 3.5–5.0)
Anion gap: 15 (ref 5–15)
BILIRUBIN TOTAL: 0.9 mg/dL (ref 0.3–1.2)
BUN: 26 mg/dL — AB (ref 6–20)
CO2: 22 mmol/L (ref 22–32)
CREATININE: 1.47 mg/dL — AB (ref 0.61–1.24)
Calcium: 10.6 mg/dL — ABNORMAL HIGH (ref 8.9–10.3)
Chloride: 99 mmol/L — ABNORMAL LOW (ref 101–111)
GFR calc Af Amer: >60 mL/min (ref >60)
GLUCOSE: 148 mg/dL — AB (ref 65–99)
Potassium: 3.5 mmol/L (ref 3.5–5.1)
Sodium: 136 mmol/L (ref 135–145)
TOTAL PROTEIN: 9.6 g/dL — AB (ref 6.5–8.1)

## 2016-09-12 LAB — CBC
HEMATOCRIT: 51.2 % (ref 39.0–52.0)
Hemoglobin: 18.6 g/dL — ABNORMAL HIGH (ref 13.0–17.0)
MCH: 30.8 pg (ref 26.0–34.0)
MCHC: 36.3 g/dL — ABNORMAL HIGH (ref 30.0–36.0)
MCV: 84.9 fL (ref 78.0–100.0)
PLATELETS: 344 10*3/uL (ref 150–400)
RBC: 6.03 MIL/uL — ABNORMAL HIGH (ref 4.22–5.81)
RDW: 13.4 % (ref 11.5–15.5)
WBC: 10.7 10*3/uL — ABNORMAL HIGH (ref 4.0–10.5)

## 2016-09-12 LAB — ACETAMINOPHEN LEVEL: Acetaminophen (Tylenol), Serum: <10 ug/mL — ABNORMAL LOW (ref 10–30)

## 2016-09-12 LAB — SALICYLATE LEVEL: Salicylate Lvl: <7 mg/dL (ref 2.8–30.0)

## 2016-09-12 MED ORDER — IBUPROFEN 200 MG PO TABS
600.0000 mg | ORAL_TABLET | Freq: Three times a day (TID) | ORAL | Status: DC | PRN
  Administered 2016-09-13: 600 mg via ORAL
  Filled 2016-09-12: qty 3

## 2016-09-12 MED ORDER — ACETAMINOPHEN 325 MG PO TABS: 650.0000 mg | ORAL_TABLET | ORAL | Status: DC | PRN

## 2016-09-12 MED ORDER — ONDANSETRON HCL 4 MG PO TABS: 4.0000 mg | ORAL_TABLET | Freq: Three times a day (TID) | ORAL | Status: DC | PRN

## 2016-09-12 MED ORDER — ZOLPIDEM TARTRATE 5 MG PO TABS
5.0000 mg | ORAL_TABLET | Freq: Every evening | ORAL | Status: DC | PRN
  Administered 2016-09-13: 5 mg via ORAL
  Filled 2016-09-12: qty 1

## 2016-09-12 MED ORDER — ALUM & MAG HYDROXIDE-SIMETH 200-200-20 MG/5ML PO SUSP: 30.0000 mL | ORAL | Status: DC | PRN

## 2016-09-12 MED ORDER — LORAZEPAM 1 MG PO TABS
1.0000 mg | ORAL_TABLET | Freq: Three times a day (TID) | ORAL | Status: DC | PRN
  Administered 2016-09-13: 1 mg via ORAL
  Filled 2016-09-12: qty 1

## 2016-09-12 NOTE — ED Provider Notes (Signed)
[facility]-EMERGENCY DEPT Provider Note   CSN: [id number] Arrival date & time: 09/12/16  2235   By signing my name below, I, Clarisse GougeXavier Herndon, attest that this documentation has been prepared under the direction and in the presence of Dione Boozeavid Sitlali Koerner, MD. Electronically signed, Clarisse GougeXavier Herndon, ED Scribe. 09/13/16. 4:18 AM.    History   Chief Complaint Chief Complaint  Patient presents with  . Manic Behavior  LEVEL 5 CAVEAT FOR PSYCHIATRIC DISORDER  The history is provided by the patient and medical records. The history is limited by the condition of the patient. No language interpreter was used.    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 Pt reports Hx of drug use. He states he tried to inhale water, and he stared at the sun recently. He responds to multiple questions with profound questions. Pt denies suicidal ideations or A/V hallucinations.   Past Medical History:  Diagnosis Date  . ADD (attention deficit disorder)   . Allergy   . Asthma   . Bipolar 1 disorder (HCC)     There are no active problems to display for this patient.   No past surgical history on file.     Home Medications    Prior to Admission medications   Medication Sig Start Date End Date Taking? Authorizing Provider  doxycycline (VIBRAMYCIN) 100 MG capsule Take 1 capsule (100 mg total) by mouth 2 (two) times daily. Patient not taking: Reported on 09/12/2016 01/16/15   Carmelina DaneJeffery S Anderson, MD  lithium 600 MG capsule Take 600 mg by mouth 2 (two) times daily. 08/27/16 09/26/16  Historical Provider, MD  OLANZapine (ZYPREXA) 15 MG tablet Take 15 mg by mouth at bedtime. 08/27/16 09/26/16  Historical Provider, MD  traMADol (ULTRAM) 50 MG tablet Take 1 tablet (50 mg total) by mouth every 8 (eight) hours as needed for pain. Patient not taking: Reported on 01/16/2015 04/23/13   Lorre Munroeegina W Baity, NP    Family  History Family History  Problem Relation Age of Onset  . Cancer Father     testicular  . Diabetes Neg Hx   . Heart disease Neg Hx   . Early death Neg Hx   . Stroke Neg Hx     Social History Social History  Substance Use Topics  . Smoking status: Former Games developermoker  . Smokeless tobacco: Never Used  . Alcohol use 1.2 oz/week    1 Cans of beer, 1 Shots of liquor per week     Comment: social - only about 3 drinks     Allergies   Other   Review of Systems Review of Systems  Unable to perform ROS: Psychiatric disorder     Physical Exam Updated Vital Signs BP 128/88 (BP Location: Left Arm)   Pulse 107   Temp 97.8 F (36.6 C) (Oral)   Resp 20   SpO2 99%   Physical Exam  Constitutional: He is oriented to person, place, and time. He appears well-developed and well-nourished.  HENT:  Head: Normocephalic and atraumatic.  Eyes: EOM are normal. Pupils are equal, round, and reactive to light.  Neck: Normal range of motion. Neck supple. No JVD present.  Cardiovascular: Normal rate, regular rhythm and normal heart sounds.   No murmur heard. Pulmonary/Chest: Effort normal and breath sounds normal. He has no wheezes. He has no rales. He exhibits no tenderness.  Abdominal: Soft. Bowel sounds are normal. He exhibits  no distension and no mass. There is no tenderness.  Musculoskeletal: Normal range of motion. He exhibits no edema.  Lymphadenopathy:    He has no cervical adenopathy.  Neurological: He is alert and oriented to person, place, and time. No cranial nerve deficit. He exhibits normal muscle tone. Coordination normal.  Skin: Skin is warm and dry. No rash noted.  Psychiatric: His speech is rapid and/or pressured and tangential. He is actively hallucinating. Thought content is delusional. He expresses impulsivity and inappropriate judgment. He expresses no homicidal and no suicidal ideation. He expresses no suicidal plans and no homicidal plans.  Pressured speech with random  thoughts and flight of idea. Does not answer questions appropriately. Does not appear to be responding to internal stimuli. Inappropriate laughing noted frequently.  Nursing note and vitals reviewed.    ED Treatments / Results  DIAGNOSTIC STUDIES: Oxygen Saturation is 99% on RA, normal by my interpretation.    COORDINATION OF CARE: 4:18 AM Discussed treatment plan with pt at bedside and pt agreed to plan.  Labs (all labs ordered are listed, but only abnormal results are displayed) Labs Reviewed  COMPREHENSIVE METABOLIC PANEL - Abnormal; Notable for the following:       Result Value   Chloride 99 (*)    Glucose, Bld 148 (*)    BUN 26 (*)    Creatinine, Ser 1.47 (*)    Calcium 10.6 (*)    Total Protein 9.6 (*)    Albumin 6.1 (*)    All other components within normal limits  ACETAMINOPHEN LEVEL - Abnormal; Notable for the following:    Acetaminophen (Tylenol), Serum <10 (*)    All other components within normal limits  CBC - Abnormal; Notable for the following:    WBC 10.7 (*)    RBC 6.03 (*)    Hemoglobin 18.6 (*)    MCHC 36.3 (*)    All other components within normal limits  RAPID URINE DRUG SCREEN, HOSP PERFORMED - Abnormal; Notable for the following:    Tetrahydrocannabinol POSITIVE (*)    All other components within normal limits  ETHANOL  SALICYLATE LEVEL    Procedures Procedures (including critical care time)  Medications Ordered in ED Medications  alum & mag hydroxide-simeth (MAALOX/MYLANTA) 200-200-20 MG/5ML suspension 30 mL (not administered)  ondansetron (ZOFRAN) tablet 4 mg (not administered)  zolpidem (AMBIEN) tablet 5 mg (5 mg Oral Given 09/13/16 0020)  ibuprofen (ADVIL,MOTRIN) tablet 600 mg (600 mg Oral Given 09/13/16 0301)  acetaminophen (TYLENOL) tablet 650 mg (not administered)  LORazepam (ATIVAN) tablet 1 mg (1 mg Oral Given 09/13/16 0020)     Initial Impression / Assessment and Plan / ED Course  I have reviewed the triage vital signs and the  nursing notes.  Pertinent labs & imaging results that were available during my care of the patient were reviewed by me and considered in my medical decision making (see chart for details).  Clinical Course    Patient with history of bipolar disorder who is clearly manic. No evidence of homicidal or suicidal ideation. Old records are reviewed, and he does have prior admission to behavioral [facility] for bipolar disorder. Screening labs are obtained showing polycythemia and renal insufficiency - probably related to mild dehydration. He is considered medically cleared at this point. TTS consultation is pending.  Final Clinical Impressions(s) / ED Diagnoses   Final diagnoses:  Bipolar disorder, manic phase (HCC)  Polycythemia  Renal insufficiency    New Prescriptions New Prescriptions   No medications  on file   I personally performed the services described in this documentation, which was scribed in my presence. The recorded information has been reviewed and is accurate.       Dione Boozeavid Rieley Khalsa, MD 09/13/16 [phone]

## 2016-09-12 NOTE — ED Triage Notes (Addendum)
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; pt's family reports pressured speech, elated discussion topics and mood, pacing; family stated that pt took showers all day long; pt has diagnosis of bipolar disorder and was [facility]; pt smiling in triage and laughing when responding to this nurses questions; when asked why he was laughing, pt said "I'm not typing anything" and continued laughing; denies AV hallucinations; when asked if he had anything thoughts of harming himself, pt stated "it's not a matter of harming myself, I'm already in so much harm, part of Alex died already and that's when I chose love over fear". Pt unable to answer most questions with a direct and relevant answer. Oriented to time, place, and self; unable to answer questions about the situation.

## 2016-09-12 NOTE — ED Notes (Signed)
Pt changed into maroon scrubs and wanded by security 

## 2016-09-12 NOTE — ED Notes (Signed)
Pt. Transferred to [facility] from ED to room after screening for contraband. Report to include Situation, Background, Assessment and [facility]ations [facility]. Pt. Oriented to unit including Q15 minute rounds as well as the security cameras for their protection. Patient is alert and oriented, warm and dry in no acute distress. Patient denies SI, HI, and AVH. Pt. Encouraged to let me know if needs arise.

## 2016-09-13 ENCOUNTER — Inpatient Hospital Stay
Admit: 2016-09-13 | Discharge: 2016-09-22 | DRG: 885 | Disposition: A | Discharge status: Home / Self Care | Payer: No Typology Code available for payment source | Source: Other Acute Inpatient Hospital | Attending: Psychiatry | Admitting: Psychiatry

## 2016-09-13 DIAGNOSIS — Z91199 Patient's noncompliance with other medical treatment and regimen due to unspecified reason: Secondary | ICD-10-CM

## 2016-09-13 DIAGNOSIS — F3113 Bipolar disorder, current episode manic without psychotic features, severe: Secondary | ICD-10-CM

## 2016-09-13 DIAGNOSIS — F312 Bipolar disorder, current episode manic severe with psychotic features: Secondary | ICD-10-CM | POA: Diagnosis present

## 2016-09-13 DIAGNOSIS — M419 Scoliosis, unspecified: Secondary | ICD-10-CM | POA: Diagnosis present

## 2016-09-13 DIAGNOSIS — G4700 Insomnia, unspecified: Secondary | ICD-10-CM | POA: Diagnosis present

## 2016-09-13 DIAGNOSIS — F4010 Social phobia, unspecified: Secondary | ICD-10-CM | POA: Diagnosis present

## 2016-09-13 DIAGNOSIS — Z87891 Personal history of nicotine dependence: Secondary | ICD-10-CM

## 2016-09-13 DIAGNOSIS — F909 Attention-deficit hyperactivity disorder, unspecified type: Secondary | ICD-10-CM | POA: Diagnosis present

## 2016-09-13 DIAGNOSIS — R5383 Other fatigue: Secondary | ICD-10-CM | POA: Diagnosis not present

## 2016-09-13 DIAGNOSIS — Z79899 Other long term (current) drug therapy: Secondary | ICD-10-CM

## 2016-09-13 DIAGNOSIS — F3110 Bipolar disorder, current episode manic without psychotic features, unspecified: Secondary | ICD-10-CM | POA: Insufficient documentation

## 2016-09-13 DIAGNOSIS — F429 Obsessive-compulsive disorder, unspecified: Secondary | ICD-10-CM | POA: Diagnosis present

## 2016-09-13 DIAGNOSIS — F1210 Cannabis abuse, uncomplicated: Secondary | ICD-10-CM | POA: Diagnosis present

## 2016-09-13 DIAGNOSIS — G8929 Other chronic pain: Secondary | ICD-10-CM | POA: Diagnosis present

## 2016-09-13 DIAGNOSIS — F319 Bipolar disorder, unspecified: Secondary | ICD-10-CM | POA: Diagnosis not present

## 2016-09-13 DIAGNOSIS — Z818 Family history of other mental and behavioral disorders: Secondary | ICD-10-CM

## 2016-09-13 DIAGNOSIS — Z9119 Patient's noncompliance with other medical treatment and regimen: Secondary | ICD-10-CM | POA: Diagnosis not present

## 2016-09-13 MED ORDER — OLANZAPINE 5 MG PO TBDP
15.0000 mg | ORAL_TABLET | Freq: Two times a day (BID) | ORAL | Status: DC
  Administered 2016-09-13 – 2016-09-14 (×2): 15 mg via ORAL
  Filled 2016-09-13 (×2): qty 3

## 2016-09-13 MED ORDER — ALUM & MAG HYDROXIDE-SIMETH 200-200-20 MG/5ML PO SUSP: 30.0000 mL | ORAL | Status: DC | PRN

## 2016-09-13 MED ORDER — LITHIUM CARBONATE ER 300 MG PO TBCR
600.0000 mg | EXTENDED_RELEASE_TABLET | Freq: Two times a day (BID) | ORAL | Status: DC
  Administered 2016-09-13 – 2016-09-18 (×10): 600 mg via ORAL
  Filled 2016-09-13 (×10): qty 2

## 2016-09-13 MED ORDER — LITHIUM CARBONATE ER 300 MG PO TBCR
600.0000 mg | EXTENDED_RELEASE_TABLET | Freq: Two times a day (BID) | ORAL | Status: DC
  Filled 2016-09-13: qty 2

## 2016-09-13 MED ORDER — ZOLPIDEM TARTRATE 5 MG PO TABS
5.0000 mg | ORAL_TABLET | Freq: Every evening | ORAL | Status: DC | PRN
  Administered 2016-09-13 – 2016-09-16 (×4): 5 mg via ORAL
  Filled 2016-09-13 (×4): qty 1

## 2016-09-13 MED ORDER — ONDANSETRON HCL 4 MG PO TABS: 4.0000 mg | ORAL_TABLET | Freq: Three times a day (TID) | ORAL | Status: DC | PRN

## 2016-09-13 MED ORDER — ACETAMINOPHEN 325 MG PO TABS
650.0000 mg | ORAL_TABLET | ORAL | Status: DC | PRN
  Administered 2016-09-14 – 2016-09-20 (×5): 650 mg via ORAL
  Filled 2016-09-13 (×5): qty 2

## 2016-09-13 MED ORDER — IBUPROFEN 600 MG PO TABS
600.0000 mg | ORAL_TABLET | Freq: Three times a day (TID) | ORAL | Status: DC | PRN
  Administered 2016-09-13 – 2016-09-19 (×6): 600 mg via ORAL
  Filled 2016-09-13 (×6): qty 1

## 2016-09-13 MED ORDER — OLANZAPINE 5 MG PO TBDP
15.0000 mg | ORAL_TABLET | Freq: Two times a day (BID) | ORAL | Status: DC
  Administered 2016-09-13: 15 mg via ORAL
  Filled 2016-09-13: qty 3

## 2016-09-13 MED ORDER — MAGNESIUM HYDROXIDE 400 MG/5ML PO SUSP: 30.0000 mL | Freq: Every day | ORAL | Status: DC | PRN

## 2016-09-13 NOTE — ED Notes (Signed)
Up in hall walking, talking, smiling.  Pt occasionally walking with eyes closed

## 2016-09-13 NOTE — BH Assessment (Signed)
Due to patient acceptance, notified [facility] that patient no longer needs placement.   Davina PokeJoVea Haruye Lainez, LCSW Therapeutic Triage Specialist [facility] 09/13/2016 12:32 PM

## 2016-09-13 NOTE — ED Notes (Signed)
Hourly rounding reveals patient sleeping in room. No complaints, stable, in no acute distress. Q15 minute rounds and monitoring via Security Cameras to continue. 

## 2016-09-13 NOTE — ED Notes (Addendum)
Pt initially agreed to take the lithium but them moved his head when he went to take them.  Pt then  made a swallowing movement and stated that he had taken them and he did not need take anymore.  Pt declined to take the pills "again".Pt did take the zyprexa with out difficulty.  Pt is aware that he is being admitted.

## 2016-09-13 NOTE — ED Notes (Signed)
Pt's mother Nigel Berthold(marth [phone]) called and reports that he has been having problems for about the past 2 months.  She reports that he was admitted to [facility] and was diagnosed with bipolar and put on medications. She reports that he was just dc'd 2 weeks and has been off of his medications for at least 1 week.  She reports that he was put on lithium and zyprexa and has refused to take them because he didn't like the way they made him feel.  She also reports that [place] and that the MD told him that he was not bipolar.

## 2016-09-13 NOTE — [facility] Group Notes (Signed)
[facility] Group Notes:  (Nursing/MHT/Case Management/Adjunct)  Date:  09/13/2016  Time:  9:41 PM  Type of Therapy:  Evening Wrap-up Group  Participation Level:  Active  Participation Quality:  Active but having a Flight of ideas and Off topic  Affect:  Disorganized  Cognitive:  Disorganized and Delusional  Insight:  None  Engagement in Group:  Off Topic with Inappropriate laughter  Modes of Intervention:  Discussion  Summary of Progress/Problems:  Tomasita MorrowChelsea Nanta Lilliemae Fruge 09/13/2016, 9:41 PM

## 2016-09-13 NOTE — BH Assessment (Addendum)
Tele Assessment Note   Bing NeighborsCharles A Brownley is an 25 y.o. male, who presents [facility] and [facility]. Pt reported, [facility], "because love always wins." Pt referenced himself in the third person, using his middle name Trinna Post(Alex). Pt reported, he was both Leonette Mostharles and Trinna [name]. Pt reported, "Trinna Postlex has an idea, take a laser, point it at a crystal and see how messages come through, and measure the error." Pt reported, "love is not two infinite things, take a point split into two." Pt reported, people rather see me dead than believe me." Pt reported, he used to see a bright light that no one saw." Pt reported, he was an infinite pain, he cannot die and so suicide is a joke. Pt reported having visions of being trapped and needing to break away. Clinician spoke to pt's parents and the pt reported, if he goes to the dark place he rather die. Pt reported, having access to a .40 caliber pistol. Pt's father reported, the pt has started having "episodes" in middle school. Pt's father reported, the pt remembered the promise he made, "he would never commit suicide." Pt reported, he always hear and see things. Pt reported, hearing light and love. Pt denies, HI and self-injurious behaviors. Pt reported experiencing the following depressive symptoms: sadness/low mood, decreased sleep, guilt, feeling hopeless/worthless.   Per Dr. 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 Pt reports Hx of drug use. He states he tried to inhale water, and he stared at the sun recently. He responds to multiple questions with profound questions. Pt denies suicidal ideations or A/V hallucinations."   Pt denied verbal, physical and sexual abuse. Pt denied substance use however pt's UDS is positive for marijuana. Pt reported, he was linked to a psychiatrist but "he gave me lethal injections." Pt did not disclose the name of the psychiatrist, because "he did not  want to say mistranslate of love."  Pt reported, he think he is linked to an ACTT Team, pt could not recall the name of the agency. Pt reported, previous inpatient admissions. Pt's mother reported, pt was [facility] two weeks ago and a week later pt was sent to another facility where the psychiatrist took the pt off his medications because the pt reported he was not going to take them.  Pt's mother reported, the pt was then prescribed over the counter medications.   Pt presented alert in scrubs with word salad speech. Pt's eye contact was poor. Pt's mood was relaxed. Pt's thought process was tangential and flight of ideas. Pt's judgement is impaired. Pt's concentration is fair. Pt's insight and impulse control are poor. Pt reported, he can contract for safety outside of the hospital. Pt's parents reported, the feel the pt will not be safe outside of the hospital. Clinician was unable to assess pt;s orientation.  Pt reported, he will sign in voluntarily  if inpatient is recommended "but my hand can't move on it's own."   Diagnosis: Bipolar 1 Disorder [facility](HCC)  Past Medical History:  Past Medical History:  Diagnosis Date  . ADD (attention deficit disorder)   . Allergy   . Asthma   . Bipolar 1 disorder (HCC)     No past surgical history on file.  Family History:  Family History  Problem Relation Age of Onset  . Cancer Father     testicular  . Diabetes Neg Hx   . Heart disease  Neg Hx   . Early death Neg Hx   . Stroke Neg Hx     Social History:  reports that he has quit smoking. He has never used smokeless tobacco. He reports that he drinks about 1.2 oz of alcohol per week . He reports that he does not use drugs.  Additional Social History:  Alcohol / Drug Use Pain Medications: See MAR Prescriptions: See MAR Over the Counter: See MAR History of alcohol / drug use?: Yes Substance #1 Name of Substance 1: Marijuana 1 - Age of First Use: UTA 1 - Amount (size/oz):  Pt's UDS [place] postive for marijuana. 1 - Frequency: UTA 1 - Duration: UTA 1 - Last Use / Amount: UTA  CIWA:   COWS:    PATIENT STRENGTHS: (choose at least two) Active sense of humor Supportive family/friends  Allergies:  Allergies  Allergen Reactions  . Other     Pt states that all medicines are poison antibiotics, ssri's etc. Could not get a concrete answer on this     Home Medications:  (Not in a hospital admission)  OB/GYN Status:  No LMP for male patient.  General Assessment Data Location of Assessment: [facility] TTS Assessment: In system Is this a Tele or Face-to-Face Assessment?: Face-to-Face Is this an Initial Assessment or a Re-assessment for this encounter?: Initial Assessment Marital status: Separated Maiden name: NA Is patient pregnant?: No Pregnancy Status: No Living Arrangements: Parent Can pt return to current living arrangement?: Yes Admission Status: Voluntary Is patient capable of signing voluntary admission?: Yes Referral Source: Other ([facility]) Insurance type: BCBS     Crisis Care Plan Living Arrangements: Parent Legal Guardian: Other: (Self) Name of Psychiatrist: NA Name of Therapist: NA  Education Status Is patient currently in school?: No Current Grade: NA Highest grade of school patient has completed: 12th grade Name of school: NA Contact person: NA  Risk to self with the past 6 months Suicidal Ideation: No-Not Currently/Within Last 6 Months Has patient been a risk to self within the past 6 months prior to admission? : No Suicidal Intent: No Has patient had any suicidal intent within the past 6 months prior to admission? : No Is patient at risk for suicide?: No Suicidal Plan?: No Has patient had any suicidal plan within the past 6 months prior to admission? : No Access to Means: No What has been your use of drugs/alcohol within the last 12 months?: Marijuana Previous Attempts/Gestures: No How many times?: 0 Other Self Harm  Risks: NA Triggers for Past Attempts: None known Intentional Self Injurious Behavior: None (Pt denies. ) Family Suicide History: Unable to assess Recent stressful life event(s): Other (Comment) (Unknown) Persecutory voices/beliefs?: No Depression: Yes Depression Symptoms: Feeling worthless/self pity, Guilt, Isolating (sad) Substance abuse history and/or treatment for substance abuse?: No Suicide prevention information given to non-admitted patients: Not applicable  Risk to Others within the past 6 months Homicidal Ideation: No (Pt denies.) Does patient have any lifetime risk of violence toward others beyond the six months prior to admission? : No Thoughts of Harm to Others: No Current Homicidal Intent: No Current Homicidal Plan: No Access to Homicidal Means: Yes Describe Access to Homicidal Means: Pt reports, having a .40 cal pistol.  Identified Victim: NA History of harm to others?: No Assessment of Violence: None Noted (Pt denies.) Violent Behavior Description: NA Does patient have access to weapons?: Yes (Comment) Criminal Charges Pending?: No Does patient have a court date: No Is patient on probation?: No  Psychosis  Hallucinations: Auditory, Visual Delusions: None noted  Mental Status Report Appearance/Hygiene: In scrubs Eye Contact: Poor Motor Activity: Unremarkable Speech: Word salad Level of Consciousness: Alert Mood: Other (Comment) (relaxed) Affect:  (congruent with mood.) Anxiety Level: None Thought Processes: Tangential, Flight of Ideas Judgement: Impaired Orientation: Unable to assess Obsessive Compulsive Thoughts/Behaviors: Unable to Assess  Cognitive Functioning Concentration: Fair Memory: Recent Impaired IQ: Average Insight: Poor Impulse Control: Fair Appetite: Poor Weight Loss:  (UTA) Weight Gain:  (UTA) Sleep: Decreased Total Hours of Sleep:  (Pt reported, none.) Vegetative Symptoms: None  ADLScreening [facility] Assessment Services) Patient's  cognitive ability adequate to safely complete daily activities?: Yes Patient able to express need for assistance with ADLs?: Yes Independently performs ADLs?: Yes (appropriate for developmental age)  Prior Inpatient Therapy Prior Inpatient Therapy: Yes Prior Therapy Dates: 2017 Prior [facility]ilty[facility] Reason for Treatment: Bipolar  Prior Outpatient Therapy Prior Outpatient Therapy: No Prior Therapy Dates: NA Prior Therapy Facilty/Provider(s): NA Reason for Treatment: NA Does patient have an ACCT team?: Yes Does patient have Intensive In-House Services?  : No Does patient have [facility] services? : Unknown Does patient have P4CC services?: No  ADL Screening (condition at time of admission) Patient's cognitive ability adequate to safely complete daily activities?: Yes Is the patient deaf or have difficulty hearing?: No Does the patient have difficulty seeing, even when wearing glasses/contacts?: No Does the patient have difficulty concentrating, remembering, or making decisions?: Yes (Pt reports difficulty concentrating. ) Patient able to express need for assistance with ADLs?: Yes Does the patient have difficulty dressing or bathing?: No Independently performs ADLs?: Yes (appropriate for developmental age) Does the patient have difficulty walking or climbing stairs?: No Weakness of Legs: None Weakness of Arms/Hands: None       Abuse/Neglect Assessment (Assessment to be complete while patient is alone) Physical Abuse: Denies (Pt denies, ) Verbal Abuse: Denies (Pt denies.) Sexual Abuse: Denies (Pt denies. )     Advance Directives (For Healthcare) Does Patient Have a Medical Advance Directive?: No Would patient like information on creating a medical advance directive?: No - Patient declined    Additional Information 1:1 In Past 12 Months?: No CIRT Risk: No Elopement Risk: No Does patient have medical clearance?: Yes     Disposition: Nira ConnJason Berry,  NP recommends inpatient treatment. Disposition discussed with Dr. Preston FleetingGlick and Jillyn HiddenGary, RN.  Per [facility]Ann, [facility]. TTS to seek placement.  Disposition Initial Assessment Completed for this Encounter: Yes Disposition of Patient: Other dispositions (Pending NP review. ) Other disposition(s): Other (Comment) (Pending NP review. )  Gwinda Passereylese D Bennett 09/13/2016 2:06 AM   Gwinda Passereylese D Bennett, MS, [facility], [facility] Triage Specialist [phone]

## 2016-09-13 NOTE — ED Notes (Signed)
Pt. Manic, pressured speech and flight of ideas, oblpquely answering questions.

## 2016-09-13 NOTE — ED Notes (Signed)
Hourly rounding reveals patient in room. No complaints, stable, in no acute distress. Q15 minute rounds and monitoring via Security Cameras to continue. 

## 2016-09-13 NOTE — ED Notes (Signed)
Up talking w/ other pt in hall, smiling, laughing

## 2016-09-13 NOTE — ED Notes (Addendum)
Pt ambulatory w/o difficulty to [facility] with sheriff. IVC papers, MAR report, facesheet, assessment note, transfer report, EMTELA, belongings sent with sheriff.

## 2016-09-13 NOTE — ED Notes (Signed)
Hourly rounding reveals patient in hall. No complaints, stable, in no acute distress. Q15 minute rounds and monitoring via [employer] Cameras to continue. Redirected to his room.

## 2016-09-13 NOTE — ED Notes (Signed)
[facility] updated concerning delay, will call them as soon as IVC papers have been received.

## 2016-09-13 NOTE — Progress Notes (Signed)
Patient with bright affect, rapid speech, good eye contact. Patient is hyper verbal and delusional. Cooperative with admission interview and assessment. Skin check with no wounds or bruises. Skin check with no contraband. Dinner tray ordered and patient eating dinner at this time. Patient requested vegetarian diet. Denies SI/HI/AVH. States he does see light and love. Requested patient to sign consent forms and patient states "Which name should I use?'. Patient believes he is two personalities and that love is all that matters. Patient states he did smoke marijuana around Thanksgiving time. Patient states his stressors are "everything and nothing at all". Oriented x 2, informed of place and situation. Safety maintained.

## 2016-09-13 NOTE — Progress Notes (Signed)
Patient has been accepted to [facility].  Patient assigned to room 315 Accepting physician is Dr. Ardyth HarpsHernandez Attending is Dr. Ardyth HarpsHernandez.  Call report to [phone].  Representative was Forensic scientistN Charge Nurse Arnetha MassyGwenn  Jaylia Pettus K. Sherlon HandingHarris, LCAS-A, LPC-A, [facility]  Counselor 09/13/2016 9:58 AM

## 2016-09-13 NOTE — ED Notes (Signed)
Sheriff contacted concerning transport, will call back when paper are recieved

## 2016-09-13 NOTE — ED Notes (Signed)
Patient sitting in hall laughing for no apparent reason.

## 2016-09-13 NOTE — ED Notes (Signed)
Hourly rounding reveals patient pacing in hall. No complaints, stable, in no acute distress. Q15 minute rounds and monitoring via Security Cameras to continue.  

## 2016-09-13 NOTE — Consult Note (Signed)
[facility] Consult   Reason for Consult:  Mania  Referring Physician:  EDP Patient Identification: Isaiah Garza MRN:  [id number] Principal Diagnosis: Bipolar affective disorder, manic, severe ([place]) Diagnosis:   Patient Active Problem List   Diagnosis Date Noted  . Bipolar affective disorder, manic, severe ([place]) [F31.13] 09/13/2016    Priority: High    Total Time spent with patient: 45 minutes  Subjective:   Isaiah Garza is a 25 y.o. male patient reports, "Love is the only medicine."  HPI: 24 yo male who presents with mania:  Pressured speech, hyperverbal, hyperreligious, and laughing inappropriately.  "I never sleep."  Pleasant but tangential.  Denies suicidal/homicidal ideations, hallucinations, and alcohol/drug abuse.  Past Psychiatric History: bipolar disorder  Risk to Self: Suicidal Ideation: No-Not Currently/Within Last 6 Months Suicidal Intent: No Is patient at risk for suicide?: No Suicidal Plan?: No Access to Means: No What has been your use of drugs/alcohol within the last 12 months?: Marijuana How many times?: 0 Other Self Harm Risks: NA Triggers for Past Attempts: None known Intentional Self Injurious Behavior: None (Pt denies. ) Risk to Others: Homicidal Ideation: No (Pt denies.) Thoughts of Harm to Others: No Current Homicidal Intent: No Current Homicidal Plan: No Access to Homicidal Means: Yes Describe Access to Homicidal Means: Pt reports, having a .40 cal pistol.  Identified Victim: NA History of harm to others?: No Assessment of Violence: None Noted (Pt denies.) Violent Behavior Description: NA Does patient have access to weapons?: Yes (Comment) Criminal Charges Pending?: No Does patient have a court date: No Prior Inpatient Therapy: Prior Inpatient Therapy: Yes Prior Therapy Dates: 2017 Prior Therapy Facilty/Provider(s): [facility] Reason for Treatment: Bipolar Prior Outpatient Therapy: Prior Outpatient Therapy: No Prior Therapy  Dates: NA Prior Therapy Facilty/Provider(s): NA Reason for Treatment: NA Does patient have an ACCT team?: Yes Does patient have Intensive In-House Services?  : No Does patient have [facility] services? : Unknown Does patient have P4CC services?: No  Past Medical History:  Past Medical History:  Diagnosis Date  . ADD (attention deficit disorder)   . Allergy   . Asthma   . Bipolar 1 disorder ([place])    No past surgical history on file. Family History:  Family History  Problem Relation Age of Onset  . Cancer Father     testicular  . Diabetes Neg Hx   . Heart disease Neg Hx   . Early death Neg Hx   . Stroke Neg Hx    Family Psychiatric  History: unknown Social History:  History  Alcohol Use  . 1.2 oz/week  . 1 Cans of beer, 1 Shots of liquor per week    Comment: social - only about 3 drinks     History  Drug Use No    Social History   Social History  . Marital status: Single    Spouse name: N/A  . Number of children: N/A  . Years of education: N/A   Occupational History  . cook    Social History Main Topics  . Smoking status: Former Research scientist (life sciences)  . Smokeless tobacco: Never Used  . Alcohol use 1.2 oz/week    1 Cans of beer, 1 Shots of liquor per week     Comment: social - only about 3 drinks  . Drug use: No  . Sexual activity: Yes   Other Topics Concern  . None   Social History Narrative   Exercise - assorted (walking, running, cycling, and cardio)   Additional Social  History:    Allergies:   Allergies  Allergen Reactions  . Other     Pt states that all medicines are poison antibiotics, ssri's etc. Could not get a concrete answer on this     Labs:  Results for orders placed or performed during the hospital encounter of 09/12/16 (from the past 48 hour(s))  Rapid urine drug screen (hospital performed)     Status: Abnormal   Collection Time: 09/12/16 11:01 PM  Result Value Ref Range   Opiates NONE DETECTED NONE DETECTED   Cocaine NONE DETECTED NONE DETECTED    Benzodiazepines NONE DETECTED NONE DETECTED   Amphetamines NONE DETECTED NONE DETECTED   Tetrahydrocannabinol POSITIVE (A) NONE DETECTED   Barbiturates NONE DETECTED NONE DETECTED    Comment:        DRUG SCREEN FOR MEDICAL PURPOSES ONLY.  IF CONFIRMATION IS NEEDED FOR ANY PURPOSE, NOTIFY LAB WITHIN 5 DAYS.        LOWEST DETECTABLE LIMITS FOR URINE DRUG SCREEN Drug Class       Cutoff (ng/mL) Amphetamine      1000 Barbiturate      200 Benzodiazepine   678 Tricyclics       938 Opiates          300 Cocaine          300 THC              50   Comprehensive metabolic panel     Status: Abnormal   Collection Time: 09/12/16 11:13 PM  Result Value Ref Range   Sodium 136 135 - 145 mmol/L   Potassium 3.5 3.5 - 5.1 mmol/L   Chloride 99 (L) 101 - 111 mmol/L   CO2 22 22 - 32 mmol/L   Glucose, Bld 148 (H) 65 - 99 mg/dL   BUN 26 (H) 6 - 20 mg/dL   Creatinine, Ser 1.47 (H) 0.61 - 1.24 mg/dL   Calcium 10.6 (H) 8.9 - 10.3 mg/dL   Total Protein 9.6 (H) 6.5 - 8.1 g/dL   Albumin 6.1 (H) 3.5 - 5.0 g/dL   AST 25 15 - 41 U/L   ALT 29 17 - 63 U/L   Alkaline Phosphatase 75 38 - 126 U/L   Total Bilirubin 0.9 0.3 - 1.2 mg/dL   GFR calc non Af Amer >60 >60 mL/min   GFR calc Af Amer >60 >60 mL/min    Comment: (NOTE) The eGFR has been calculated using the CKD EPI equation. This calculation has not been validated in all clinical situations. eGFR's persistently <60 mL/min signify possible Chronic Kidney Disease.    Anion gap 15 5 - 15  Ethanol     Status: None   Collection Time: 09/12/16 11:13 PM  Result Value Ref Range   Alcohol, Ethyl (B) <5 <5 mg/dL    Comment:        LOWEST DETECTABLE LIMIT FOR SERUM ALCOHOL IS 5 mg/dL FOR MEDICAL PURPOSES ONLY   Salicylate level     Status: None   Collection Time: 09/12/16 11:13 PM  Result Value Ref Range   Salicylate Lvl <1.0 2.8 - 30.0 mg/dL  Acetaminophen level     Status: Abnormal   Collection Time: 09/12/16 11:13 PM  Result Value Ref Range    Acetaminophen (Tylenol), Serum <10 (L) 10 - 30 ug/mL    Comment:        THERAPEUTIC CONCENTRATIONS VARY SIGNIFICANTLY. A RANGE OF 10-30 ug/mL MAY BE AN EFFECTIVE CONCENTRATION FOR MANY PATIENTS. HOWEVER, SOME ARE  BEST TREATED AT CONCENTRATIONS OUTSIDE THIS RANGE. ACETAMINOPHEN CONCENTRATIONS >150 ug/mL AT 4 HOURS AFTER INGESTION AND >50 ug/mL AT 12 HOURS AFTER INGESTION ARE OFTEN ASSOCIATED WITH TOXIC REACTIONS.   cbc     Status: Abnormal   Collection Time: 09/12/16 11:13 PM  Result Value Ref Range   WBC 10.7 (H) 4.0 - 10.5 K/uL   RBC 6.03 (H) 4.22 - 5.81 MIL/uL   Hemoglobin 18.6 (H) 13.0 - 17.0 g/dL   HCT 51.2 39.0 - 52.0 %   MCV 84.9 78.0 - 100.0 fL   MCH 30.8 26.0 - 34.0 pg   MCHC 36.3 (H) 30.0 - 36.0 g/dL   RDW 13.4 11.5 - 15.5 %   Platelets 344 150 - 400 K/uL    Current Facility-Administered Medications  Medication Dose Route Frequency Provider Last Rate Last Dose  . acetaminophen (TYLENOL) tablet 650 mg  650 mg Oral X6I PRN Delora Fuel, MD      . alum & mag hydroxide-simeth (MAALOX/MYLANTA) 200-200-20 MG/5ML suspension 30 mL  30 mL Oral PRN Delora Fuel, MD      . ibuprofen (ADVIL,MOTRIN) tablet 600 mg  600 mg Oral W8E PRN Delora Fuel, MD   321 mg at 09/13/16 0301  . ondansetron (ZOFRAN) tablet 4 mg  4 mg Oral Y2Q PRN Delora Fuel, MD      . zolpidem [facility]) tablet 5 mg  5 mg Oral QHS PRN Delora Fuel, MD   5 mg at 82/50/03 0020   Current Outpatient Prescriptions  Medication Sig Dispense Refill  . lithium 600 MG capsule Take 600 mg by mouth 2 (two) times daily.    Marland Kitchen OLANZapine (ZYPREXA) 15 MG tablet Take 15 mg by mouth at bedtime.    . traMADol (ULTRAM) 50 MG tablet Take 1 tablet (50 mg total) by mouth every 8 (eight) hours as needed for pain. (Patient not taking: Reported on 01/16/2015) 30 tablet 0    Musculoskeletal: Strength & Muscle Tone: within normal limits Gait & Station: normal Patient leans: N/A  Psychiatric Specialty Exam: Physical Exam   Constitutional: He is oriented to person, place, and time. He appears well-developed and well-nourished.  HENT:  Head: Normocephalic.  Neck: Normal range of motion.  Respiratory: Effort normal.  Musculoskeletal: Normal range of motion.  Neurological: He is alert and oriented to person, place, and time.  Psychiatric: His mood appears anxious. His affect is labile. His speech is rapid and/or pressured. He is hyperactive. Thought content is delusional. Cognition and memory are impaired.    Review of Systems  Psychiatric/Behavioral: The patient is nervous/anxious.   All other systems reviewed and are negative.   Blood pressure 128/88, pulse 107, temperature 97.8 F (36.6 C), temperature source Oral, resp. rate 20, SpO2 99 %.There is no height or weight on file to calculate BMI.  General Appearance: Casual  Eye Contact:  Good  Speech:  Pressured  Volume:  Normal  Mood:  Anxious and Euphoric  Affect:  Blunt  Thought Process:  Coherent and Descriptions of Associations: Tangential  Orientation:  Full (Time, Place, and Person)  Thought Content:  Delusions and Tangential  Suicidal Thoughts:  No  Homicidal Thoughts:  No  Memory:  Immediate;   Fair Recent;   Poor Remote;   Fair  Judgement:  Impaired  Insight:  Lacking  Psychomotor Activity:  Increased  Concentration:  Concentration: Poor and Attention Span: Poor  Recall:  Poor  Fund of Knowledge:  Fair  Language:  Good  Akathisia:  No  Handed:  Right  AIMS (if indicated):     Assets:  Leisure Time Physical Health Resilience Social Support  ADL's:  Intact  Cognition:  WNL  Sleep:        Treatment Plan Summary: Daily contact with patient to assess and evaluate symptoms and progress in treatment, Medication management and Plan bipolar affective disorder, mania, severe:  -Crisis stabilization -Medication management:  Start Lithium 600 mg BID for mood stabilization and Zyprexa 15 mg BID for mania -Individual  counseling  Disposition: Recommend psychiatric Inpatient admission when medically cleared.  Waylan Boga, NP 09/13/2016 11:07 AM

## 2016-09-13 NOTE — ED Notes (Signed)
Staring at the cabinet by the phone laughing.

## 2016-09-13 NOTE — ED Notes (Signed)
Pt's father into see 

## 2016-09-13 NOTE — ED Notes (Signed)
Talking quielty w/ his mother.  Contact information for [facility] given to mother.

## 2016-09-13 NOTE — ED Notes (Signed)
Pt's mother into see 

## 2016-09-13 NOTE — Tx Team (Signed)
Initial Treatment Plan 09/13/2016 6:37 PM Isaiah NeighborsCharles A Southers ZOX:[id number]    PATIENT STRESSORS: Health problems Medication change or noncompliance   PATIENT STRENGTHS: Communication skills Physical Health   PATIENT IDENTIFIED PROBLEMS: Mania     Asthma                  DISCHARGE CRITERIA:  Adequate post-discharge living arrangements Improved stabilization in mood, thinking, and/or behavior Motivation to continue treatment in a less acute level of care  PRELIMINARY DISCHARGE PLAN: Attend aftercare/continuing care group Return to previous living arrangement  PATIENT/FAMILY INVOLVEMENT: This treatment plan has been presented to and reviewed with the patient, Isaiah Neighborsharles A Kail, and/or family member, .  The patient and family have been given the opportunity to ask questions and make suggestions.  Ignacia FellingJennifer A Tharun Cappella, RN 09/13/2016, 6:37 PM

## 2016-09-13 NOTE — BH Assessment (Addendum)
Pt [facility]:   [facility]  [facility]  [facility]  [facility]  [facility]

## 2016-09-13 NOTE — ED Notes (Signed)
Hourly rounding reveals patient pacing in hall. Redirection minimally effective. No complaints, stable, in no acute distress. Q15 minute rounds and monitoring via [employer] to continue.

## 2016-09-14 DIAGNOSIS — F312 Bipolar disorder, current episode manic severe with psychotic features: Principal | ICD-10-CM

## 2016-09-14 DIAGNOSIS — Z9119 Patient's noncompliance with other medical treatment and regimen: Secondary | ICD-10-CM

## 2016-09-14 DIAGNOSIS — Z91199 Patient's noncompliance with other medical treatment and regimen due to unspecified reason: Secondary | ICD-10-CM

## 2016-09-14 MED ORDER — PALIPERIDONE ER 3 MG PO TB24
6.0000 mg | ORAL_TABLET | Freq: Every day | ORAL | Status: DC
  Administered 2016-09-14 – 2016-09-21 (×8): 6 mg via ORAL
  Filled 2016-09-14 (×8): qty 2

## 2016-09-14 NOTE — Plan of Care (Signed)
Problem: Education: Goal: Mental status will improve Outcome: Not Progressing Patient continues to be delusional

## 2016-09-14 NOTE — [facility] Group Notes (Signed)
[facility] LCSW Group Therapy Note  Date/Time:09/14/16 1500  Type of Therapy and Topic:  Group Therapy:  Overcoming Obstacles  Participation Level:  active  Description of Group:    In this group patients will be encouraged to explore what they see as obstacles to their own wellness and recovery. They will be guided to discuss their thoughts, feelings, and behaviors related to these obstacles. The group will process together ways to cope with barriers, with attention given to specific choices patients can make. Each patient will be challenged to identify changes they are motivated to make in order to overcome their obstacles. This group will be process-oriented, with patients participating in exploration of their own experiences as well as giving and receiving support and challenge from other group members.  Therapeutic Goals: 1. Patient will identify personal and current obstacles as they relate to admission. 2. Patient will identify barriers that currently interfere with their wellness or overcoming obstacles.  3. Patient will identify feelings, thought process and behaviors related to these barriers. 4. Patient will identify two changes they are willing to make to overcome these obstacles:    Summary of Patient Progress: Pt was engaged and active in group.  He paid attention to information presented and responded to requests for input and examples from his own situation.  Pt comments were unrelated to the questions posed, as his response to a request to identify a current obstacle he is facing was "don't break down the door."   Therapeutic Modalities:   Cognitive Behavioral Therapy Solution Focused Therapy Motivational Interviewing Relapse Prevention Therapy  Daleen SquibbGreg Adriell Polansky, LCSW
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
## 2016-09-14 NOTE — Progress Notes (Signed)
Recreation Therapy Notes  Date: 12.26.17 Time: 9:30 am Location: Craft Room  Group Topic: Self-expression  Goal Area(s) Addresses:  Patient will be able to identify a color that represents each emotion.  Patient will verbalize benefit of using art as a means of self-expression. Patient will verbalize one emotion experienced during group.  Behavioral Response: Attentive, Interactive  Intervention: The Colors Within Me  Activity: Patients were given blank face worksheets and were instructed to pick a color for each emotion they were feeling. Patients were instructed to show on the worksheet how much of that emotion they were feeling.  Education: LRT educated patients on other forms of self-expression.   Education Outcome: In group clarification offered  Clinical Observations/Feedback: Patient drew lines and symbols and colored in some things. Patient contributed to group discussion and asked questions. LRT had to redirect patient from having side conversations with peer. Patient complied.  Jacquelynn CreeGreene,Denyce Harr M, LRT/CTRS 09/14/2016 1:26 PM

## 2016-09-14 NOTE — Plan of Care (Signed)
Problem: Coping: Goal: Ability to cope will improve Outcome: Not Progressing Remains restless, delusional

## 2016-09-14 NOTE — Plan of Care (Signed)
Problem: Coping: Goal: Ability to demonstrate self-control will improve Outcome: Not Progressing Remains delusional, restless, pacing

## 2016-09-14 NOTE — Progress Notes (Signed)
Patient delusional, bizarre and tangential. Reports he is Isaiah Garza today. Patient paranoid about medications states we are trying to trick and kill him. Patient reports love is the cure all. Encouragement and support offered. Medications given as prescribed.  Patient receptive and remains safe on unit with q 15 min checks.

## 2016-09-14 NOTE — Progress Notes (Signed)
D: Patient very bizarre and tangential. Laughs in appropriately. Patient has multiple personalites stating their names are Vanessa DurhamKat, Jyden, and Trinna [name]. Denies SI/HI/AVH. Patient was messing with call bell and somehow cut finger and broke skin. Finger cleaned with alcohol and bandaid applied with 2x2 gauze. When patient is asked about pain he states, "imagine like your cells are being ripped apart." Patient fixated on how love will fix everything and the medicine is poisonous.  A: Medication given with education. Encouragement provided.  R: Patient compliant with medication. He remains calm and cooperative. Safety maintained with 15 min checks.

## 2016-09-14 NOTE — [facility] Group Notes (Signed)
[facility] Group Notes:  (Nursing/MHT/Case Management/Adjunct)  Date:  09/14/2016  Time:  4:11 PM  Type of Therapy:  Psychoeducational Skills  Participation Level:  Active  Participation Quality:  Appropriate  Affect:  Not Congruent  Cognitive:  Disorganized  Insight:  Lacking and Limited  Engagement in Group:  Engaged and Off Topic  Modes of Intervention:  Discussion, Education and Support  Summary of Progress/Problems:  Isaiah SmokeCara Garza Isaiah Garza 09/14/2016, 4:11 PM

## 2016-09-14 NOTE — [facility] Suicide Risk Assessment (Signed)
[facility]   Nursing information obtained from:   review of nursing notes from emergency room and review of current nursing notes Demographic factors:   patient has a history of noncompliance and a family history of mental illness Current Mental Status:   patient is disorganized and psychotic. Paranoid. Odd euphoric affect. Denies suicidal intent Loss Factors:   none Historical Factors:   no known past history of suicide attempts Risk Reduction Factors:   supportive family  Total Time spent with patient: 1 hour Principal Problem: Bipolar affective disorder, manic, severe, with psychotic behavior (HCC) Diagnosis:   Patient Active Problem List   Diagnosis Date Noted  . Bipolar affective disorder, manic, severe, with psychotic behavior (HCC) [F31.2] 09/14/2016  . Cannabis abuse [F12.10] 09/14/2016  . Noncompliance [Z91.19] 09/14/2016  . Bipolar affective disorder, manic, severe (HCC) [F31.13] 09/13/2016   Subjective Data: "The humans brought Isaiah Garza to the hospital". 25 year old man. Chart reviewed. Patient interviewed and examined. [facility] voluntarily by his family. Patient is very disorganized and difficult to interview. Apparently he has been displaying more disorganized thought and odd behavior recently. It appears that he is probably noncompliant with his medicine. Patient indicates that he was being prescribed lithium and Zyprexa but probably has not taken it for several days. He denies that he's been drinking or using any drugs although the drug screen is positive for cannabis. Patient denies any specific trauma in his life. As noted above it is very difficult to interview him because of his mental state. Patient makes very little sense most of the time. Rambling agitated unable to answer questions directly.  Social history: Not married. Lives with his parents. He says that he's been going to [employer] but it sounds like it's questionable whether he's really been attending.  Medical history: Other than mental illness no known active medical problems  Substance abuse history: History of cannabis abuse. Nothing else documented or that he will admit to.  Continued Clinical Symptoms:  Alcohol Use Disorder Identification Test Final Score (AUDIT): 2 The "Alcohol Use Disorders Identification Test", Guidelines for Use in Primary Care, Second Edition.  World Science writerHealth Organization [facility](WHO). Score between 0-7:  no or low risk or alcohol related problems. Score between 8-15:  moderate risk of alcohol related problems. Score between 16-19:  high risk of alcohol related problems. Score 20 or above:  warrants further diagnostic evaluation for alcohol dependence and treatment.   CLINICAL FACTORS:   Bipolar Disorder:   Mixed State   Musculoskeletal: Strength & Muscle Tone: within normal limits Gait & Station: normal Patient leans: N/A  Psychiatric Specialty Exam: Physical Exam  Nursing note and vitals reviewed. Constitutional: He appears well-developed and well-nourished.  HENT:  Head: Normocephalic and atraumatic.  Eyes: Conjunctivae are normal. Pupils are equal, round, and reactive to light.  Neck: Normal range of motion.  Cardiovascular: Regular rhythm and normal heart sounds.   Respiratory: Effort normal. No respiratory distress.  GI: Soft.  Musculoskeletal: Normal range of motion.  Neurological: He is alert.  Skin: Skin is warm and dry.  Psychiatric: His affect is labile and inappropriate. His speech is rapid and/or pressured and tangential. He is hyperactive. Thought content is paranoid. He expresses impulsivity and inappropriate judgment. He expresses suicidal ideation. He expresses no suicidal plans. He exhibits abnormal recent memory.    Review of Systems  Constitutional: Negative.   HENT: Negative.   Eyes: Negative.  Respiratory: Negative.   Cardiovascular: Negative.    Gastrointestinal: Negative.   Musculoskeletal: Negative.   Skin: Negative.   Neurological: Negative.   Psychiatric/Behavioral: Negative.     Blood pressure 123/75, pulse 91, temperature 98.3 F (36.8 C), temperature source Oral, resp. rate 18, height 6\' 3"  (1.905 m), weight 76.2 kg (168 lb), SpO2 100 %.Body mass index is 21 kg/m.  General Appearance: Casual  Eye Contact:  Good  Speech:  Pressured  Volume:  Increased  Mood:  Euphoric  Affect:  Inappropriate  Thought Process:  Disorganized  Orientation:  Full (Time, Place, and Person)  Thought Content:  Illogical  Suicidal Thoughts:  No  Homicidal Thoughts:  No  Memory:  Immediate;   Fair Recent;   Poor Remote;   Fair  Judgement:  Impaired  Insight:  Lacking  Psychomotor Activity:  Decreased  Concentration:  Concentration: Poor  Recall:  [employer]  Fund of Knowledge:  Fair  Language:  Fair  Akathisia:  No  Handed:  Right  AIMS (if indicated):     Assets:  Housing Physical Health  ADL's:  Intact  Cognition:  Impaired,  Mild  Sleep:  Number of Hours: 7.5      COGNITIVE FEATURES THAT CONTRIBUTE TO RISK:  Closed-mindedness    SUICIDE RISK:   Minimal: No identifiable suicidal ideation.  Patients presenting with no risk factors but with morbid ruminations; may be classified as minimal risk based on the severity of the depressive symptoms   PLAN OF CARE: Patient will be started on mood stabilizers and antipsychotics. Closely monitored in the hospital. Engaged in appropriate therapy and treatment. Family will be involved if possible.  I certify that inpatient services furnished can reasonably be expected to improve the patient's condition.  Mordecai RasmussenJohn Tiauna Whisnant, MD 09/14/2016, 2:50 PM

## 2016-09-14 NOTE — BHH Suicide Risk Assessment (Addendum)
[facility] INPATIENT:  Family/Significant Other Suicide Prevention Education  Suicide Prevention Education:  Education Completed; Isaiah Garza, father, [phone] has been identified by the patient as the family member/significant other with whom the patient will be residing, and identified as the person(s) who will aid the patient in the event of a mental health crisis (suicidal ideations/suicide attempt).  With written consent from the patient, the family member/significant other has been provided the following suicide prevention education, prior to the and/or following the discharge of the patient.  The suicide prevention education provided includes the following:  Suicide risk factors  Suicide prevention and interventions  National Suicide Hotline telephone number  [facility] assessment telephone number  [facility] Emergency Assistance 911  [facility] and/or Residential Mobile Crisis Unit telephone number  Request made of family/significant other to:  Remove weapons (e.g., guns, rifles, knives), all items previously/currently identified as safety concern.    Remove drugs/medications (over-the-counter, prescriptions, illicit drugs), all items previously/currently identified as a safety concern.  The family member/significant other verbalizes understanding of the suicide prevention education information provided.  The family member/significant other agrees to remove the items of safety concern listed above.  Father reports pt will be living with his mother after discharge and is unsure of status of guns in that home.  Father reports no guns in his home.  Lorri FrederickWierda, Isaiah Runions Jon, LCSW 09/14/2016, 1:56 PM

## 2016-09-14 NOTE — Progress Notes (Signed)
Patient is visible in the milieu. Displaying bizarre, disorganized behavior. Delusional with multiple personalities. Refused his Lithium at 2000 saying that nurses are trying to kill him. Took the medication later, voluntarily. Displaying hyper religiosity with flight of ideas.  Had visitation but was unable to stay in the visitation room for long. No aggressive behavior displayed. Therapeutic milieu promoted and safety precautions reinforced.

## 2016-09-14 NOTE — Plan of Care (Signed)
Problem: Health Behavior/Discharge Planning: Goal: Compliance with prescribed medication regimen will improve Outcome: Progressing Patient has been compliant with medications during this shift.

## 2016-09-14 NOTE — H&P (Signed)
Psychiatric Admission Assessment Adult  Patient Identification: Isaiah Garza MRN:  [id number] Date of Evaluation:  09/14/2016 Chief Complaint:  Bipolar1 Principal Diagnosis: Bipolar affective disorder, manic, severe, with psychotic behavior ([place]) Diagnosis:   Patient Active Problem List   Diagnosis Date Noted  . Bipolar affective disorder, manic, severe, with psychotic behavior ([place]) [F31.2] 09/14/2016  . Cannabis abuse [F12.10] 09/14/2016  . Noncompliance [Z91.19] 09/14/2016  . Bipolar affective disorder, manic, severe ([place]) [F31.13] 09/13/2016   History of Present Illness: 25 year old man with a past history of a diagnosis of bipolar disorder brought to the emergency room by his parents. Patient is not a very clear historian because of his psychosis. Evidently his bizarre behavior and thinking patterns have been going on worse for at least several days possibly longer. Patient indicates to me that he has recently been noncompliant with his medicine probably for at least 4-5 days. He is currently presenting as bizarre and disorganized in his speech. No insight. He talks a lot about suicide but when pressed it is very clear that he does not actually want to harm himself or harm anyone else. No specific recent stressors known other than his being off his medicine although his drug screen is positive for cannabis Associated Signs/Symptoms: Depression Symptoms:  psychomotor agitation, (Hypo) Manic Symptoms:  Delusions, Elevated Mood, Flight of Ideas, Grandiosity, Impulsivity, Irritable Mood, Anxiety Symptoms:  Excessive Worry, Psychotic Symptoms:  Delusions, Hallucinations: Auditory Paranoia, PTSD Symptoms: Negative Total Time spent with patient: 1 hour  Past Psychiatric History: Patient apparently has had 2 prior hospitalizations although I don't find records of them. He can name multiple antipsychotics that he has been taking. Says that he was most recently taking lithium and  Zyprexa. Patient refers to all medicines as being "poison". Denies ever having tried to kill himself or being violent in the past. Prior to that he had had an earlier diagnosis of ADHD but it sounds like even at that time he was showing signs of psychosis.  Is the patient at risk to self? Yes.    Has the patient been a risk to self in the past 6 months? Yes.    Has the patient been a risk to self within the distant past? Yes.    Is the patient a risk to others? Yes.    Has the patient been a risk to others in the past 6 months? Yes.    Has the patient been a risk to others within the distant past? Yes.     Prior Inpatient Therapy:   Prior Outpatient Therapy:    Alcohol Screening: 1. How often do you have a drink containing alcohol?: Monthly or less 2. How many drinks containing alcohol do you have on a typical day when you are drinking?: 1 or 2 3. How often do you have six or more drinks on one occasion?: Less than monthly Preliminary Score: 1 9. Have you or someone else been injured as a result of your drinking?: No 10. Has a relative or friend or a doctor or another health worker been concerned about your drinking or suggested you cut down?: No Alcohol Use Disorder Identification Test Final Score (AUDIT): 2 Brief Intervention: AUDIT score less than 7 or less-screening does not suggest unhealthy drinking-brief intervention not indicated Substance Abuse History in the last 12 months:  Yes.   Consequences of Substance Abuse: Medical Consequences:  Mental health Previous Psychotropic Medications: Yes  Psychological Evaluations: Yes  Past Medical History:  Past Medical History:  Diagnosis  Date  . ADD (attention deficit disorder)   . Allergy   . Asthma   . Bipolar 1 disorder ([place])    History reviewed. No pertinent surgical history. Family History:  Family History  Problem Relation Age of Onset  . Cancer Father     testicular  . Diabetes Neg Hx   . Heart disease Neg Hx   . Early  death Neg Hx   . Stroke Neg Hx    Family Psychiatric  History: patient says several members of his family including his grandmother and aunt had bipolar disorder and were psychotic Tobacco Screening: Have you used any form of tobacco in the last 30 days? (Cigarettes, Smokeless Tobacco, Cigars, and/or Pipes): No Social History:  History  Alcohol Use  . 1.2 oz/week  . 1 Cans of beer, 1 Shots of liquor per week    Comment: social - only about 3 drinks     History  Drug Use No    Additional Social History: Marital status: Single What is your sexual orientation?: heterosexual Does patient have children?: No                         Allergies:   Allergies  Allergen Reactions  . Other     Pt states that all medicines are poison antibiotics, ssri's etc. Could not get a concrete answer on this    Lab Results:  Results for orders placed or performed during the hospital encounter of 09/12/16 (from the past 48 hour(s))  Rapid urine drug screen (hospital performed)     Status: Abnormal   Collection Time: 09/12/16 11:01 PM  Result Value Ref Range   Opiates NONE DETECTED NONE DETECTED   Cocaine NONE DETECTED NONE DETECTED   Benzodiazepines NONE DETECTED NONE DETECTED   Amphetamines NONE DETECTED NONE DETECTED   Tetrahydrocannabinol POSITIVE (A) NONE DETECTED   Barbiturates NONE DETECTED NONE DETECTED    Comment:        DRUG SCREEN FOR MEDICAL PURPOSES ONLY.  IF CONFIRMATION IS NEEDED FOR ANY PURPOSE, NOTIFY LAB WITHIN 5 DAYS.        LOWEST DETECTABLE LIMITS FOR URINE DRUG SCREEN Drug Class       Cutoff (ng/mL) Amphetamine      1000 Barbiturate      200 Benzodiazepine   875 Tricyclics       643 Opiates          300 Cocaine          300 THC              50   Comprehensive metabolic panel     Status: Abnormal   Collection Time: 09/12/16 11:13 PM  Result Value Ref Range   Sodium 136 135 - 145 mmol/L   Potassium 3.5 3.5 - 5.1 mmol/L   Chloride 99 (L) 101 - 111 mmol/L    CO2 22 22 - 32 mmol/L   Glucose, Bld 148 (H) 65 - 99 mg/dL   BUN 26 (H) 6 - 20 mg/dL   Creatinine, Ser 1.47 (H) 0.61 - 1.24 mg/dL   Calcium 10.6 (H) 8.9 - 10.3 mg/dL   Total Protein 9.6 (H) 6.5 - 8.1 g/dL   Albumin 6.1 (H) 3.5 - 5.0 g/dL   AST 25 15 - 41 U/L   ALT 29 17 - 63 U/L   Alkaline Phosphatase 75 38 - 126 U/L   Total Bilirubin 0.9 0.3 - 1.2 mg/dL   GFR calc  non Af Amer >60 >60 mL/min   GFR calc Af Amer >60 >60 mL/min    Comment: (NOTE) The eGFR has been calculated using the CKD EPI equation. This calculation has not been validated in all clinical situations. eGFR's persistently <60 mL/min signify possible Chronic Kidney Disease.    Anion gap 15 5 - 15  Ethanol     Status: None   Collection Time: 09/12/16 11:13 PM  Result Value Ref Range   Alcohol, Ethyl (B) <5 <5 mg/dL    Comment:        LOWEST DETECTABLE LIMIT FOR SERUM ALCOHOL IS 5 mg/dL FOR MEDICAL PURPOSES ONLY   Salicylate level     Status: None   Collection Time: 09/12/16 11:13 PM  Result Value Ref Range   Salicylate Lvl <5.7 2.8 - 30.0 mg/dL  Acetaminophen level     Status: Abnormal   Collection Time: 09/12/16 11:13 PM  Result Value Ref Range   Acetaminophen (Tylenol), Serum <10 (L) 10 - 30 ug/mL    Comment:        THERAPEUTIC CONCENTRATIONS VARY SIGNIFICANTLY. A RANGE OF 10-30 ug/mL MAY BE AN EFFECTIVE CONCENTRATION FOR MANY PATIENTS. HOWEVER, SOME ARE BEST TREATED AT CONCENTRATIONS OUTSIDE THIS RANGE. ACETAMINOPHEN CONCENTRATIONS >150 ug/mL AT 4 HOURS AFTER INGESTION AND >50 ug/mL AT 12 HOURS AFTER INGESTION ARE OFTEN ASSOCIATED WITH TOXIC REACTIONS.   cbc     Status: Abnormal   Collection Time: 09/12/16 11:13 PM  Result Value Ref Range   WBC 10.7 (H) 4.0 - 10.5 K/uL   RBC 6.03 (H) 4.22 - 5.81 MIL/uL   Hemoglobin 18.6 (H) 13.0 - 17.0 g/dL   HCT 51.2 39.0 - 52.0 %   MCV 84.9 78.0 - 100.0 fL   MCH 30.8 26.0 - 34.0 pg   MCHC 36.3 (H) 30.0 - 36.0 g/dL   RDW 13.4 11.5 - 15.5 %   Platelets  344 150 - 400 K/uL    Blood Alcohol level:  Lab Results  Component Value Date   ETH <5 09/12/2016   ETH 246 (H) 01/77/9390    Metabolic Disorder Labs:  No results found for: HGBA1C, MPG No results found for: PROLACTIN Lab Results  Component Value Date   CHOL 151 01/16/2015   TRIG 43 01/16/2015   HDL 63 01/16/2015   CHOLHDL 2.4 01/16/2015   VLDL 9 01/16/2015   LDLCALC 79 01/16/2015    Current Medications: Current Facility-Administered Medications  Medication Dose Route Frequency Provider Last Rate Last Dose  . acetaminophen (TYLENOL) tablet 650 mg  650 mg Oral Q4H PRN [name], NP   650 mg at 09/14/16 1241  . alum & mag hydroxide-simeth (MAALOX/MYLANTA) 200-200-20 MG/5ML suspension 30 mL  30 mL Oral PRN [name], NP      . ibuprofen (ADVIL,MOTRIN) tablet 600 mg  600 mg Oral Q8H PRN [name], NP   600 mg at 09/13/16 2001  . lithium carbonate (LITHOBID) CR tablet 600 mg  600 mg Oral Q12H [name], NP   600 mg at 09/14/16 0825  . magnesium hydroxide (MILK OF MAGNESIA) suspension 30 mL  30 mL Oral Daily PRN [name], NP      . ondansetron [facility]) tablet 4 mg  4 mg Oral Q8H PRN [name], NP      . paliperidone (INVEGA) 24 hr tablet 6 mg  6 mg Oral Daily [name], MD      . zolpidem (AMBIEN) tablet 5 mg  5 mg Oral QHS PRN [name], NP   5 mg at 09/13/16 2139   PTA Medications: Prescriptions Prior to Admission  Medication Sig Dispense Refill Last Dose  . lithium 600 MG capsule Take 600 mg by mouth 2 (two) times daily.     Marland Kitchen OLANZapine (ZYPREXA) 15 MG tablet Take 15 mg by mouth at bedtime.     . traMADol (ULTRAM) 50 MG tablet Take 1 tablet (50 mg total) by mouth every 8 (eight) hours as needed for pain. (Patient not taking: Reported on 01/16/2015) 30 tablet 0 Not Taking    Musculoskeletal: Strength & Muscle Tone: within normal limits Gait & Station: normal Patient leans: N/A  Psychiatric Specialty Exam: Physical Exam  Vitals  reviewed. Constitutional: He appears well-developed and well-nourished.  HENT:  Head: Normocephalic and atraumatic.  Eyes: Conjunctivae are normal. Pupils are equal, round, and reactive to light.  Neck: Normal range of motion.  Cardiovascular: Regular rhythm and normal heart sounds.   Respiratory: Effort normal. No respiratory distress.  GI: Soft.  Musculoskeletal: Normal range of motion.  Neurological: He is alert.  Skin: Skin is warm and dry.  Psychiatric: His affect is labile and inappropriate. His speech is tangential. He is agitated. He is not combative. Thought content is not paranoid. He expresses impulsivity and inappropriate judgment. He expresses no homicidal and no suicidal ideation. He exhibits abnormal recent memory.    Review of Systems  Constitutional: Negative.   HENT: Negative.   Eyes: Negative.   Respiratory: Negative.   Cardiovascular: Negative.   Gastrointestinal: Negative.   Musculoskeletal: Negative.   Skin: Negative.   Neurological: Negative.   Psychiatric/Behavioral: Negative.     Blood pressure 123/75, pulse 91, temperature 98.3 F (36.8 C), temperature source Oral, resp. rate 18, height _0  (1.905 m), weight 76.2 kg (168 lb), SpO2 100 %.Body mass index is 21 kg/m.  General Appearance: Casual  Eye Contact:  Fair  Speech:  Pressured  Volume:  Increased  Mood:  Euphoric  Affect:  Inappropriate and Labile  Thought Process:  Disorganized and Irrelevant  Orientation:  Negative  Thought Content:  Illogical, Paranoid Ideation and Tangential  Suicidal Thoughts:  No  Homicidal Thoughts:  No  Memory:  Immediate;   Fair Recent;   Poor Remote;   Poor  Judgement:  Fair  Insight:  Fair  Psychomotor Activity:  Decreased  Concentration:  Concentration: Fair  Recall:  [employer] of Knowledge:  Fair  Language:  Fair  Akathisia:  No  Handed:  Right  AIMS (if indicated):     Assets:  Housing Physical Health  ADL's:  Intact  Cognition:  Impaired,  Mild   Sleep:  Number of Hours: 7.5    Treatment Plan Summary: Daily contact with patient to assess and evaluate symptoms and progress in treatment, Medication management and Plan Continue lithium 600 twice a day. I am discontinuing the Zyprexa and switching it to [place] in anticipation of long acting injectable. Continue current level of observation and involvement in groups and activities on the unit. Labs reviewed. Nothing else specific to change at this point. Tried to do some psychoeducation with the patient but he is very resistant.  Observation Level/Precautions:  15 minute checks  Laboratory:  UDS  Psychotherapy:    Medications:    Consultations:    Discharge Concerns:    Estimated LOS:  Other:     Physician Treatment Plan for Primary Diagnosis: Bipolar affective disorder, manic, severe, with psychotic behavior ([place])  Long Term Goal(s): Improvement in symptoms so as ready for discharge  Short Term Goals: Ability to verbalize feelings will improve and Compliance with prescribed medications will improve  Physician Treatment Plan for Secondary Diagnosis: Principal Problem:   Bipolar affective disorder, manic, severe, with psychotic behavior ([place]) Active Problems:   Cannabis abuse   Noncompliance  Long Term Goal(s): Improvement in symptoms so as ready for discharge  Short Term Goals: Ability to demonstrate self-control will improve, Ability to identify and develop effective coping behaviors will improve and Ability to identify triggers associated with substance abuse/mental health issues will improve  I certify that inpatient services furnished can reasonably be expected to improve the patient's condition.    Alethia Berthold, MD 12/26/20172:55 PM

## 2016-09-14 NOTE — [facility] Suicide Risk Assessment (Addendum)
[facility] INPATIENT:  Family/Significant Other Suicide Prevention Education  Suicide Prevention Education:  Education Completed; Isaiah Garza, mother, [phone]  (name of family member/significant other) has been identified by the patient as the family member/significant other with whom the patient will be residing, and identified as the person(s) who will aid the patient in the event of a mental health crisis (suicidal ideations/suicide attempt).  With written consent from the patient, the family member/significant other has been provided the following suicide prevention education, prior to the and/or following the discharge of the patient.  The suicide prevention education provided includes the following:  Suicide risk factors  Suicide prevention and interventions  National Suicide Hotline telephone number  [facility] assessment telephone number  [facility] Emergency Assistance 911  [facility] and/or Residential Mobile Crisis Unit telephone number  Request made of family/significant other to:  Remove weapons (e.g., guns, rifles, knives), all items previously/currently identified as safety concern.    Remove drugs/medications (over-the-counter, prescriptions, illicit drugs), all items previously/currently identified as a safety concern.  The family member/significant other verbalizes understanding of the suicide prevention education information provided.  The family member/significant other agrees to remove the items of safety concern listed above.  Mother agreeable to checking in with pt regarding safety and reports that there are no guns in the home.  Lorri FrederickWierda, Isaiah Meiklejohn Jon, LCSW 09/14/2016, 2:04 PM

## 2016-09-15 NOTE — Progress Notes (Signed)
Recreation Therapy Notes  Date: 12.27.17 Time: 9:30 am Location: Craft Room  Group Topic: Self-esteem  Goal Area(s) Addresses:  Patient will write at least one positive trait about self. Patient will verbalize benefit of having healthy self-esteem.  Behavioral Response: Attentive, Left early  Intervention: I Am  Activity: Patients were given a worksheet with the letter I on it and were instructed to write positive traits about themselves inside the letter.  Education: LRT educated patients on ways they can increase their self-esteem.  Education Outcome: In group clarification offered  Clinical Observations/Feedback: Patient wrote, "I am done" with a line through it, "I am finished" with a line through it, and "I am complete" with a line through it. Patient wrote on the back and started laughing inappropriately during group. Patient did not contribute to group discussion. Patient left group at approximately 10:00 am and did not return to group.  Jacquelynn CreeGreene,Bennett Ram M, LRT/CTRS 09/15/2016 11:51 AM

## 2016-09-15 NOTE — Progress Notes (Signed)
D: Patient stated slept good last night .Stated appetite is good and energy level  Is normal. Stated concentration is good . Stated on Depression scale0, hopeless  0 and anxiety 0 .( low 0-10 high) Denies suicidal  homicidal ideations  .  No auditory hallucinations  No pain concerns . Appropriate ADL'S. Interacting with peers and staff. Patient voice of a problem for him today  Was Atom Splitting noted also skipping  A: Encourage patient participation with unit programming . Instruction  Given on  Medication , verbalize understanding. R: Voice no other concerns. Staff continue to monitor

## 2016-09-15 NOTE — Progress Notes (Signed)
Patient was restless and manic, unable to stay in bed to rest. Received Ambien 5mg  as prescribed. Currently in bed sleeping. No sign of distress. Staff continue to monitor.

## 2016-09-15 NOTE — [facility] Group Notes (Signed)
[facility] LCSW Group Therapy  1:00PM Type of Therapy: Group Therapy   Participation Level: Active  Participation Quality: Attentive,Supportive  Affect: Depressed and Flat   Cognitive: Alert and Oriented   Insight: Developing/Improving and Engaged   Engagement in Therapy: Developing/Improving and Engaged  Modes of Intervention: Clarification, Confrontation, Discussion, Education, Exploration, Limit-setting, Orientation, Problem-solving, Rapport Building, Dance movement psychotherapisteality Testing, Socialization and Support   Summary of Progress/Problems: The topic for group today was emotional regulation. This group focused on both positive and negative emotion identification and allowed  group members to process ways to identify feelings, regulate negative emotions, and find healthy ways to manage internal/external emotions. Group members were asked to reflect on a time when their reaction to an emotion led to a negative outcome and explored how alternative responses using emotion regulation would have benefited them. Group members were also asked to discuss a time when emotion regulation was utilized when a negative emotion was experienced. The patient was able to identify a negative emotion and/or behavior that he would like to work on regulating. Patient spoke about his negative thoughts being a problem for him and stated that he is able to calm his mind down by writing. Patient was very engaged throughout the group and offered supportive feedback to other group members.  Jonathon JordanLynn B Mason Burleigh, MSW, LCSWA 09/15/16, 3:21 PM

## 2016-09-15 NOTE — Tx Team (Signed)
Interdisciplinary Treatment and Diagnostic Plan Update  09/15/2016 Time of Session: [name]: [id number]  Principal Diagnosis: Bipolar affective disorder, manic, severe, with psychotic behavior ([place])  Secondary Diagnoses: Principal Problem:   Bipolar affective disorder, manic, severe, with psychotic behavior ([place]) Active Problems:   Cannabis abuse   Noncompliance   Current Medications:  Current Facility-Administered Medications  Medication Dose Route Frequency Provider Last Rate Last Dose  . acetaminophen (TYLENOL) tablet 650 mg  650 mg Oral Q4H PRN [name], NP   650 mg at 09/14/16 1241  . alum & mag hydroxide-simeth (MAALOX/MYLANTA) 200-200-20 MG/5ML suspension 30 mL  30 mL Oral PRN [name], NP      . ibuprofen (ADVIL,MOTRIN) tablet 600 mg  600 mg Oral Q8H PRN [name], NP   600 mg at 09/13/16 2001  . lithium carbonate (LITHOBID) CR tablet 600 mg  600 mg Oral Q12H [name], NP   600 mg at 09/15/16 6767  . magnesium hydroxide (MILK OF MAGNESIA) suspension 30 mL  30 mL Oral Daily PRN [name], NP      . ondansetron [facility]) tablet 4 mg  4 mg Oral Q8H PRN [name], NP      . paliperidone (INVEGA) 24 hr tablet 6 mg  6 mg Oral Daily [name], MD   6 mg at 09/15/16 2094  . zolpidem (AMBIEN) tablet 5 mg  5 mg Oral QHS PRN [name], NP   5 mg at 09/14/16 2354   PTA Medications: Prescriptions Prior to Admission  Medication Sig Dispense Refill Last Dose  . lithium 600 MG capsule Take 600 mg by mouth 2 (two) times daily.     Marland Kitchen OLANZapine (ZYPREXA) 15 MG tablet Take 15 mg by mouth at bedtime.     . traMADol (ULTRAM) 50 MG tablet Take 1 tablet (50 mg total) by mouth every 8 (eight) hours as needed for pain. 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   Physician Treatment Plan for Primary Diagnosis: Bipolar affective disorder, manic, severe, with psychotic behavior ([place]) Long Term Goal(s): Improvement in symptoms so as ready for discharge Improvement in symptoms so as ready for discharge   Short Term Goals: Ability to verbalize feelings will improve Compliance with prescribed medications will improve Ability to demonstrate self-control will improve Ability to identify and develop effective coping behaviors will improve Ability to identify triggers associated with substance abuse/mental health issues will improve  Medication Management: Evaluate patient's response, side effects, and tolerance of medication regimen.  Therapeutic Interventions: 1 to 1 sessions, Unit Group sessions and Medication administration.  Evaluation of Outcomes: Not Met  Physician Treatment Plan for Secondary Diagnosis: Principal Problem:   Bipolar affective disorder, manic, severe, with psychotic behavior ([place]) Active Problems:   Cannabis abuse   Noncompliance  Long Term Goal(s): Improvement in symptoms so as ready for discharge Improvement in symptoms so as ready for discharge   Short Term Goals: Ability to verbalize feelings will improve Compliance with prescribed medications will improve Ability to demonstrate self-control will improve Ability to identify and develop effective coping behaviors will improve Ability to identify triggers associated with substance abuse/mental health issues will improve     Medication Management: Evaluate  patient's response, side effects, and tolerance of medication regimen.  Therapeutic Interventions: 1 to 1 sessions, Unit Group sessions and Medication administration.  Evaluation of Outcomes: Not Met   RN Treatment Plan for Primary Diagnosis: Bipolar affective disorder, manic, severe, with  psychotic behavior ([place]) Long Term Goal(s): Knowledge of disease and therapeutic regimen to maintain health will improve  Short Term Goals: Ability to participate in decision making will improve, Ability to verbalize feelings will improve, Ability to identify and develop effective coping behaviors will improve and Compliance with prescribed medications will improve  Medication Management: RN will administer medications as ordered by provider, will assess and evaluate patient's response and provide education to patient for prescribed medication. RN will report any adverse and/or side effects to prescribing provider.  Therapeutic Interventions: 1 on 1 counseling sessions, Psychoeducation, Medication administration, Evaluate responses to treatment, Monitor vital signs and CBGs as ordered, Perform/monitor CIWA, COWS, AIMS and Fall Risk screenings as ordered, Perform wound care treatments as ordered.  Evaluation of Outcomes: Not Met   LCSW Treatment Plan for Primary Diagnosis: Bipolar affective disorder, manic, severe, with psychotic behavior ([place]) Long Term Goal(s): Safe transition to appropriate next level of care at discharge, Engage patient in therapeutic group addressing interpersonal concerns.  Short Term Goals: Engage patient in aftercare planning with referrals and resources, Facilitate acceptance of mental health diagnosis and concerns and Increase skills for wellness and recovery  Therapeutic Interventions: Assess for all discharge needs, 1 to 1 time with Social worker, Explore available resources and support systems, Assess for adequacy in community support network, Educate family and significant other(s) on suicide prevention, Complete Psychosocial Assessment, Interpersonal group therapy.  Evaluation of Outcomes: Not Met   Progress in Treatment: Attending groups: Yes. Participating in groups: Yes. Taking medication as prescribed: Yes. Toleration medication: Yes. Family/Significant  other contact made: Yes, individual(s) contacted:  father and mother Patient understands diagnosis: Yes. Discussing patient identified problems/goals with staff: Yes. Medical problems stabilized or resolved: Yes. Denies suicidal/homicidal ideation: Yes. Issues/concerns per patient self-inventory: No. Other: none  New problem(s) identified: No, Describe:  None identified  New Short Term/Long Term Goal(s):none  Discharge Plan or Barriers: Return to outpt treatment in [place].  Pt will be living with his mother at discharge.  Reason for Continuation of Hospitalization: Mania  Estimated Length of Stay:  Attendees: Patient: Isaiah Garza 09/15/2016   Physician: Dr Weber Cooks 09/15/2016   Nursing: Carolynn Sayers, RN 09/15/2016   RN Care Manager: 09/15/2016   Social Worker: Lurline Idol, LCSW 09/15/16  Recreational Therapist: Delle Reining 09/15/2016   Other:  09/15/2016   Other:  09/15/2016   Other: 09/15/2016     Scribe for Treatment Team: Joanne Chars, [place] 09/15/2016 2:56 PM

## 2016-09-15 NOTE — Plan of Care (Signed)
Problem: Safety: Goal: Ability to remain free from injury will improve Outcome: Progressing Encourage patient to pace himself jumping about on unit

## 2016-09-15 NOTE — Progress Notes (Signed)
[facility] MD Progress Note  09/15/2016 12:18 PM Isaiah Garza  MRN:  [id number] Subjective:  "I'm feeling great, I'm starting to plan [place]." Principal Problem: Bipolar affective disorder, manic, severe, with psychotic behavior ([place]) Diagnosis:   Patient Active Problem List   Diagnosis Date Noted  . Bipolar affective disorder, manic, severe, with psychotic behavior ([place]) [F31.2] 09/14/2016  . Cannabis abuse [F12.10] 09/14/2016  . Noncompliance [Z91.19] 09/14/2016  . Bipolar affective disorder, manic, severe ([place]) [F31.13] 09/13/2016   Total Time spent with patient: 45 minutes  Past Psychiatric History: Patient has a past history of mood disorder psychotic symptoms also a past history of noncompliance and substance abuse. Referred to the hospital because of manic psychotic behavior  Past Medical History:  Past Medical History:  Diagnosis Date  . ADD (attention deficit disorder)   . Allergy   . Asthma   . Bipolar 1 disorder ([place])    History reviewed. No pertinent surgical history. Family History:  Family History  Problem Relation Age of Onset  . Cancer Father     testicular  . Diabetes Neg Hx   . Heart disease Neg Hx   . Early death Neg Hx   . Stroke Neg Hx    Family Psychiatric  History: No known family history Social History:  History  Alcohol Use  . 1.2 oz/week  . 1 Cans of beer, 1 Shots of liquor per week    Comment: social - only about 3 drinks     History  Drug Use No    Social History   Social History  . Marital status: Single    Spouse name: N/A  . Number of children: N/A  . Years of education: N/A   Occupational History  . cook    Social History Main Topics  . Smoking status: Former Research scientist (life sciences)  . Smokeless tobacco: Never Used  . Alcohol use 1.2 oz/week    1 Cans of beer, 1 Shots of liquor per week     Comment: social - only about 3 drinks  . Drug use: No  . Sexual activity: Yes   Other Topics Concern  . None   Social History Narrative   Exercise -  assorted (walking, running, cycling, and cardio)   Additional Social History:                         Sleep: Fair  Appetite:  Good  Current Medications: Current Facility-Administered Medications  Medication Dose Route Frequency Provider Last Rate Last Dose  . acetaminophen (TYLENOL) tablet 650 mg  650 mg Oral Q4H PRN [name], NP   650 mg at 09/14/16 1241  . alum & mag hydroxide-simeth (MAALOX/MYLANTA) 200-200-20 MG/5ML suspension 30 mL  30 mL Oral PRN [name], NP      . ibuprofen (ADVIL,MOTRIN) tablet 600 mg  600 mg Oral Q8H PRN [name], NP   600 mg at 09/13/16 2001  . lithium carbonate (LITHOBID) CR tablet 600 mg  600 mg Oral Q12H [name], NP   600 mg at 09/15/16 0076  . magnesium hydroxide (MILK OF MAGNESIA) suspension 30 mL  30 mL Oral Daily PRN [name], NP      . ondansetron [facility]) tablet 4 mg  4 mg Oral Q8H PRN [name], NP      . paliperidone (INVEGA) 24 hr tablet 6 mg  6 mg Oral Daily [name], MD   6 mg  at 09/15/16 0807  . zolpidem (AMBIEN) tablet 5 mg  5 mg Oral QHS PRN [name], NP   5 mg at 09/14/16 2354    Lab Results: No results found for this or any previous visit (from the past 77 hour(s)).  Blood Alcohol level:  Lab Results  Component Value Date   ETH <5 09/12/2016   ETH 246 (H) 85/63/1497    Metabolic Disorder Labs: No results found for: HGBA1C, MPG No results found for: PROLACTIN Lab Results  Component Value Date   CHOL 151 01/16/2015   TRIG 43 01/16/2015   HDL 63 01/16/2015   CHOLHDL 2.4 01/16/2015   VLDL 9 01/16/2015   LDLCALC 79 01/16/2015    Physical Findings: AIMS:  , ,  ,  ,    CIWA:    COWS:     Musculoskeletal: Strength & Muscle Tone: within normal limits Gait & Station: normal Patient leans: N/A  Psychiatric Specialty Exam: Physical Exam  Nursing note and vitals reviewed. Constitutional: He appears well-developed and well-nourished.  HENT:  Head: Normocephalic and  atraumatic.  Eyes: Conjunctivae are normal. Pupils are equal, round, and reactive to light.  Neck: Normal range of motion.  Cardiovascular: Regular rhythm and normal heart sounds.   Respiratory: Effort normal.  GI: Soft.  Musculoskeletal: Normal range of motion.  Neurological: He is alert.  Skin: Skin is warm and dry.  Psychiatric: His speech is normal and behavior is normal. His affect is labile. Thought content is delusional. Thought content is not paranoid. He expresses impulsivity. He expresses no homicidal and no suicidal ideation. He exhibits abnormal recent memory.    Review of Systems  Constitutional: Negative.   HENT: Negative.   Eyes: Negative.   Respiratory: Negative.   Cardiovascular: Negative.   Gastrointestinal: Negative.   Musculoskeletal: Negative.   Skin: Negative.   Neurological: Negative.   Psychiatric/Behavioral: Negative for depression, hallucinations, memory loss, substance abuse and suicidal ideas. The patient is not nervous/anxious and does not have insomnia.     Blood pressure (!) 127/93, pulse 87, temperature 97.8 F (36.6 C), temperature source Oral, resp. rate 20, height 6' 3"  (1.905 m), weight 76.2 kg (168 lb), SpO2 100 %.Body mass index is 21 kg/m.  General Appearance: Disheveled  Eye Contact:  Fair  Speech:  Garbled and Normal Rate  Volume:  Increased  Mood:  Euphoric  Affect:  Congruent  Thought Process:  Disorganized  Orientation:  Full (Time, Place, and Person)  Thought Content:  Illogical, Delusions and Tangential  Suicidal Thoughts:  No  Homicidal Thoughts:  No  Memory:  Immediate;   Good Recent;   Fair Remote;   Fair  Judgement:  Impaired  Insight:  Lacking  Psychomotor Activity:  Restlessness  Concentration:  Concentration: Poor  Recall:  Avery of Knowledge:  Good  Language:  Good  Akathisia:  No  Handed:  Right  AIMS (if indicated):     Assets:  Chief Executive Officer Physical Health Social Support  ADL's:  Intact   Cognition:  Impaired,  Mild  Sleep:  Number of Hours: 5     Treatment Plan Summary: Daily contact with patient to assess and evaluate symptoms and progress in treatment, Medication management and Plan This is a 25 year old man who presented manic with flight of ideas and bizarre behavior and medicine noncompliance. On evaluation today the patient met with me and met with treatment team. He seems slightly better in so far as today he was not argumentative.  He didn't talk about suicide today. He didn't seem to be hostile. He seems more euphoric and to of settled into a routine here. He showed [place] a elaborate diagram in which she is planning how to perfect the world. At the same time however he is taking his medication and does not have any complaints about it. Plan is to continue current medicines including mood stabilizers and antipsychotics. Encourage engagement in groups on the unit. Treatment team can be in contact with family about outpatient treatment. Still psychotic however and not yet ready for discharge.  Alethia Berthold, MD 09/15/2016, 12:18 PM

## 2016-09-15 NOTE — Plan of Care (Signed)
Problem: Coping: Goal: Ability to cope will improve Outcome: Not Progressing Patient not able to cope at this time due to confusion and not able to learn coping skills at this time [facility]

## 2016-09-15 NOTE — Progress Notes (Signed)
D: Patient is alert and disoriented on the unit this shift. Patient attended  in groups today. Patient denies suicidal ideation, homicidal ideation, auditory or visual hallucinations at the present time.  A: Scheduled medications are administered to patient as per MD orders. Emotional support and encouragement are provided. Patient is maintained on q.15 minute safety checks. Patient is informed to notify staff with questions or concerns. R: No adverse medication reactions are noted. Patient is cooperative with medication administration  . Patient is  Confused, anxious and cooperative on the unit at this time. Patient does not  Interact  with others on the unit this shift. Patient contracts for safety at this time. Patient remains safe at this time. Depression 7/10 Anxiety 6/10

## 2016-09-15 NOTE — Progress Notes (Signed)
Patient fell asleep after taking sleeping medication. Currently remains in bed sleeping. No sign of dicomfort. Therapeutic milieu  Promoted. Patient remains safe in room.

## 2016-09-16 MED ORDER — CARBAMIDE PEROXIDE 6.5 % OT SOLN
5.0000 [drp] | Freq: Two times a day (BID) | OTIC | Status: DC
  Administered 2016-09-16 – 2016-09-21 (×11): 5 [drp] via OTIC
  Filled 2016-09-16: qty 15

## 2016-09-16 MED ORDER — NEOMYCIN-POLYMYXIN-HC 3.5-10000-1 OT SUSP
1.0000 [drp] | Freq: Two times a day (BID) | OTIC | Status: DC
  Administered 2016-09-16 – 2016-09-21 (×11): 1 [drp] via OTIC
  Filled 2016-09-16: qty 10

## 2016-09-16 MED ORDER — HYDROCORTISONE-ACETIC ACID 1-2 % OT SOLN
4.0000 [drp] | Freq: Three times a day (TID) | OTIC | Status: DC
  Filled 2016-09-16: qty 10

## 2016-09-16 NOTE — Tx Team (Signed)
Interdisciplinary Treatment and Diagnostic Plan Update  09/16/2016 Time of Session: 11:00 AM Isaiah Garza MRN: [id number]  Principal Diagnosis: Bipolar affective disorder, manic, severe, with psychotic behavior (HCC)  Secondary Diagnoses: Principal Problem:   Bipolar affective disorder, manic, severe, with psychotic behavior (HCC) Active Problems:   Cannabis abuse   Noncompliance   Current Medications:  Current Facility-Administered Medications  Medication Dose Route Frequency Provider Last Rate Last Dose  . acetaminophen (TYLENOL) tablet 650 mg  650 mg Oral Q4H PRN Charm RingsJamison Y Lord, NP   650 mg at 09/14/16 1241  . alum & mag hydroxide-simeth (MAALOX/MYLANTA) 200-200-20 MG/5ML suspension 30 mL  30 mL Oral PRN Charm RingsJamison Y Lord, NP      . ibuprofen (ADVIL,MOTRIN) tablet 600 mg  600 mg Oral Q8H PRN Charm RingsJamison Y Lord, NP   600 mg at 09/16/16 0813  . lithium carbonate (LITHOBID) CR tablet 600 mg  600 mg Oral Q12H Charm RingsJamison Y Lord, NP   600 mg at 09/16/16 0813  . magnesium hydroxide (MILK OF MAGNESIA) suspension 30 mL  30 mL Oral Daily PRN Charm RingsJamison Y Lord, NP      . ondansetron [facility](ZOFRAN) tablet 4 mg  4 mg Oral Q8H PRN Charm RingsJamison Y Lord, NP      . paliperidone (INVEGA) 24 hr tablet 6 mg  6 mg Oral Daily Audery AmelJohn T Clapacs, MD   6 mg at 09/16/16 0813  . zolpidem (AMBIEN) tablet 5 mg  5 mg Oral QHS PRN Charm RingsJamison Y Lord, NP   5 mg at 09/15/16 2139   PTA Medications: Prescriptions Prior to Admission  Medication Sig Dispense Refill Last Dose  . lithium 600 MG capsule Take 600 mg by mouth 2 (two) times daily.     Marland Kitchen. OLANZapine (ZYPREXA) 15 MG tablet Take 15 mg by mouth at bedtime.     . traMADol (ULTRAM) 50 MG tablet Take 1 tablet (50 mg total) by mouth every 8 (eight) hours as needed for pain. (Patient not taking: Reported on 01/16/2015) 30 tablet 0 Not Taking    Patient Stressors: Health problems Medication change or noncompliance  Patient Strengths: Manufacturing systems engineerCommunication skills Physical Health  Treatment  Modalities: Medication Management, Group therapy, Case management,  1 to 1 session with clinician, Psychoeducation, Recreational therapy.   Physician Treatment Plan for Primary Diagnosis: Bipolar affective disorder, manic, severe, with psychotic behavior (HCC) Long Term Goal(s): Improvement in symptoms so as ready for discharge Improvement in symptoms so as ready for discharge   Short Term Goals: Ability to verbalize feelings will improve Compliance with prescribed medications will improve Ability to demonstrate self-control will improve Ability to identify and develop effective coping behaviors will improve Ability to identify triggers associated with substance abuse/mental health issues will improve  Medication Management: Evaluate patient's response, side effects, and tolerance of medication regimen.  Therapeutic Interventions: 1 to 1 sessions, Unit Group sessions and Medication administration.  Evaluation of Outcomes: Progressing  Physician Treatment Plan for Secondary Diagnosis: Principal Problem:   Bipolar affective disorder, manic, severe, with psychotic behavior (HCC) Active Problems:   Cannabis abuse   Noncompliance  Long Term Goal(s): Improvement in symptoms so as ready for discharge Improvement in symptoms so as ready for discharge   Short Term Goals: Ability to verbalize feelings will improve Compliance with prescribed medications will improve Ability to demonstrate self-control will improve Ability to identify and develop effective coping behaviors will improve Ability to identify triggers associated with substance abuse/mental health issues will improve     Medication Management: Evaluate  patient's response, side effects, and tolerance of medication regimen.  Therapeutic Interventions: 1 to 1 sessions, Unit Group sessions and Medication administration.  Evaluation of Outcomes: Progressing   RN Treatment Plan for Primary Diagnosis: Bipolar affective disorder,  manic, severe, with psychotic behavior (HCC) Long Term Goal(s): Knowledge of disease and therapeutic regimen to maintain health will improve  Short Term Goals: Ability to participate in decision making will improve, Ability to verbalize feelings will improve, Ability to identify and develop effective coping behaviors will improve and Compliance with prescribed medications will improve  Medication Management: RN will administer medications as ordered by provider, will assess and evaluate patient's response and provide education to patient for prescribed medication. RN will report any adverse and/or side effects to prescribing provider.  Therapeutic Interventions: 1 on 1 counseling sessions, Psychoeducation, Medication administration, Evaluate responses to treatment, Monitor vital signs and CBGs as ordered, Perform/monitor CIWA, COWS, AIMS and Fall Risk screenings as ordered, Perform wound care treatments as ordered.  Evaluation of Outcomes: Progressing   LCSW Treatment Plan for Primary Diagnosis: Bipolar affective disorder, manic, severe, with psychotic behavior (HCC) Long Term Goal(s): Safe transition to appropriate next level of care at discharge, Engage patient in therapeutic group addressing interpersonal concerns.  Short Term Goals: Engage patient in aftercare planning with referrals and resources, Facilitate acceptance of mental health diagnosis and concerns and Increase skills for wellness and recovery  Therapeutic Interventions: Assess for all discharge needs, 1 to 1 time with Social worker, Explore available resources and support systems, Assess for adequacy in community support network, Educate family and significant other(s) on suicide prevention, Complete Psychosocial Assessment, Interpersonal group therapy.  Evaluation of Outcomes: Progressing   Progress in Treatment: Attending groups: Yes. Participating in groups: Yes. Taking medication as prescribed: Yes. Toleration  medication: Yes. Family/Significant other contact made: Yes, individual(s) contacted:  father and mother Patient understands diagnosis: Yes. Discussing patient identified problems/goals with staff: Yes. Medical problems stabilized or resolved: Yes. Denies suicidal/homicidal ideation: Yes. Issues/concerns per patient self-inventory: No. Other: none  New problem(s) identified: No, Describe:  None identified  New Short Term/Long Term Goal(s):none  Discharge Plan or Barriers[place]  Pt will be living with his mother at discharge.  Reason for Continuation of Hospitalization: Mania  Estimated Length of Stay: 3 days   Attendees: Patient: Isaiah Garza 09/16/2016   Physician: Dr Toni Amendlapacs 09/16/2016   Nursing: Elenore PaddyJennifer MOrrow, RN 09/16/2016   RN Care Manager: 09/16/2016   Social Worker: Hampton AbbotKadijah Erendida Wrenn, MSW, LCSW-A 09/16/16  Recreational Therapist: Nolon LennertBeth Green 09/16/2016   Other:  09/16/2016   Other:  09/16/2016   Other: 09/16/2016     Scribe for Treatment Team: Lynden OxfordKadijah R Antonius Hartlage, LCSWA 09/16/2016 11:12 AM

## 2016-09-16 NOTE — Progress Notes (Signed)
[facility] MD Progress Note  09/16/2016 7:44 PM Isaiah Garza  MRN:  [id number] Subjective:  "I'm feeling great, I'm starting to plan [place]."  Follow-up note for this 25 year old man with bipolar disorder. Patient was seen today and also met with the treatment team. He seems to be making real progress. Although he is still somewhat euphoric and a little hyperactive he is much less bizarre in his thinking. He was actually able to hold an appropriate conversation and acknowledge that he has bipolar disorder. He did not have any specific new complaints. He is sleeping better. Denies any suicidal or homicidal ideation. Principal Problem: Bipolar affective disorder, manic, severe, with psychotic behavior ([place]) Diagnosis:   Patient Active Problem List   Diagnosis Date Noted  . Bipolar affective disorder, manic, severe, with psychotic behavior ([place]) [F31.2] 09/14/2016  . Cannabis abuse [F12.10] 09/14/2016  . Noncompliance [Z91.19] 09/14/2016  . Bipolar affective disorder, manic, severe ([place]) [F31.13] 09/13/2016   Total Time spent with patient: 20 minutes  Past Psychiatric History: Patient has a past history of mood disorder psychotic symptoms also a past history of noncompliance and substance abuse. Referred to the hospital because of manic psychotic behavior  Past Medical History:  Past Medical History:  Diagnosis Date  . ADD (attention deficit disorder)   . Allergy   . Asthma   . Bipolar 1 disorder ([place])    History reviewed. No pertinent surgical history. Family History:  Family History  Problem Relation Age of Onset  . Cancer Father     testicular  . Diabetes Neg Hx   . Heart disease Neg Hx   . Early death Neg Hx   . Stroke Neg Hx    Family Psychiatric  History: No known family history Social History:  History  Alcohol Use  . 1.2 oz/week  . 1 Cans of beer, 1 Shots of liquor per week    Comment: social - only about 3 drinks     History  Drug Use No    Social History   Social  History  . Marital status: Single    Spouse name: N/A  . Number of children: N/A  . Years of education: N/A   Occupational History  . cook    Social History Main Topics  . Smoking status: Former Research scientist (life sciences)  . Smokeless tobacco: Never Used  . Alcohol use 1.2 oz/week    1 Cans of beer, 1 Shots of liquor per week     Comment: social - only about 3 drinks  . Drug use: No  . Sexual activity: Yes   Other Topics Concern  . None   Social History Narrative   Exercise - assorted (walking, running, cycling, and cardio)   Additional Social History:                         Sleep: Fair  Appetite:  Good  Current Medications: Current Facility-Administered Medications  Medication Dose Route Frequency Provider Last Rate Last Dose  . acetaminophen (TYLENOL) tablet 650 mg  650 mg Oral Q4H PRN [name], NP   650 mg at 09/16/16 1201  . acetic acid-hydrocortisone(VOSOL-HC) otic solution 2-1%  4 drop Left Ear TID [name], MD      . alum & mag hydroxide-simeth (MAALOX/MYLANTA) 200-200-20 MG/5ML suspension 30 mL  30 mL Oral PRN [name], NP      . carbamide peroxide (DEBROX) 6.5 % otic solution 5 drop  5 drop Left  Ear BID [name], MD      . ibuprofen (ADVIL,MOTRIN) tablet 600 mg  600 mg Oral Q8H PRN [name], NP   600 mg at 09/16/16 0813  . lithium carbonate (LITHOBID) CR tablet 600 mg  600 mg Oral Q12H [name], NP   600 mg at 09/16/16 0813  . magnesium hydroxide (MILK OF MAGNESIA) suspension 30 mL  30 mL Oral Daily PRN [name], NP      . ondansetron [facility]) tablet 4 mg  4 mg Oral Q8H PRN [name], NP      . paliperidone (INVEGA) 24 hr tablet 6 mg  6 mg Oral Daily [name], MD   6 mg at 09/16/16 0813  . zolpidem (AMBIEN) tablet 5 mg  5 mg Oral QHS PRN [name], NP   5 mg at 09/15/16 2139    Lab Results: No results found for this or any previous visit (from the past 48 hour(s)).  Blood Alcohol level:  Lab Results  Component  Value Date   ETH <5 09/12/2016   ETH 246 (H) 50/93/2671    Metabolic Disorder Labs: No results found for: HGBA1C, MPG No results found for: PROLACTIN Lab Results  Component Value Date   CHOL 151 01/16/2015   TRIG 43 01/16/2015   HDL 63 01/16/2015   CHOLHDL 2.4 01/16/2015   VLDL 9 01/16/2015   LDLCALC 79 01/16/2015    Physical Findings: AIMS:  , ,  ,  ,    CIWA:    COWS:     Musculoskeletal: Strength & Muscle Tone: within normal limits Gait & Station: normal Patient leans: N/A  Psychiatric Specialty Exam: Physical Exam  Nursing note and vitals reviewed. Constitutional: He appears well-developed and well-nourished.  HENT:  Head: Normocephalic and atraumatic.  Eyes: Conjunctivae are normal. Pupils are equal, round, and reactive to light.  Neck: Normal range of motion.  Cardiovascular: Regular rhythm and normal heart sounds.   Respiratory: Effort normal.  GI: Soft.  Musculoskeletal: Normal range of motion.  Neurological: He is alert.  Skin: Skin is warm and dry.  Psychiatric: His speech is normal and behavior is normal. His affect is labile. Thought content is not paranoid and not delusional. He expresses impulsivity. He expresses no homicidal and no suicidal ideation. He exhibits abnormal recent memory.    Review of Systems  Constitutional: Negative.   HENT: Negative.   Eyes: Negative.   Respiratory: Negative.   Cardiovascular: Negative.   Gastrointestinal: Negative.   Musculoskeletal: Negative.   Skin: Negative.   Neurological: Negative.   Psychiatric/Behavioral: Negative for depression, hallucinations, memory loss, substance abuse and suicidal ideas. The patient is not nervous/anxious and does not have insomnia.     Blood pressure (!) 166/77, pulse 77, temperature 98 F (36.7 C), temperature source Oral, resp. rate 20, height 6' 3"  (1.905 m), weight 76.2 kg (168 lb), SpO2 100 %.Body mass index is 21 kg/m.  General Appearance: Disheveled  Eye Contact:  Fair   Speech:  Normal Rate  Volume:  Normal  Mood:  Euphoric  Affect:  Congruent  Thought Process:  Coherent  Orientation:  Full (Time, Place, and Person)  Thought Content:  Tangential  Suicidal Thoughts:  No  Homicidal Thoughts:  No  Memory:  Immediate;   Good Recent;   Fair Remote;   Fair  Judgement:  Fair  Insight:  Fair  Psychomotor Activity:  Normal  Concentration:  Concentration: Poor  Recall:  Fair  Fund of Knowledge:  Good  Language:  Good  Akathisia:  No  Handed:  Right  AIMS (if indicated):     Assets:  Communication Skills Housing Physical Health Social Support  ADL's:  Intact  Cognition:  Impaired,  Mild  Sleep:  Number of Hours: 7.15     Treatment Plan Summary: Daily contact with patient to assess and evaluate symptoms and progress in treatment, Medication management and Plan Patient has been compliant with medication. He is principally being treated with lithium and Invega, which I chose hoping that he could ultimately be put on a long-acting injectable. So far he has been compliant with medication. I am not changing any of the orders at this point. I have placed an order for a lithium level to be drawn the morning of December 30. Patient encouraged to continue participation in groups and medication compliance. He may be getting closer to discharge. Estimated length of stay probably in the range of 3-4 days.  Alethia Berthold, MD 09/16/2016, 7:44 PM

## 2016-09-16 NOTE — [facility] Group Notes (Signed)
[facility] LCSW Group Therapy  ? 09/16/2016 1pm  ? Type of Therapy: Group Therapy  ? Participation Level: Active  ? Participation Quality: Attentive, Sharing and Supportive  ? Affect: Appropriate Cognitive: Alert and Oriented  ? Insight: Developing/Improving and Engaged  ? Engagement in Therapy: Developing/Improving and Engaged  ? Modes of Intervention: Clarification, Confrontation, Discussion, Education, Exploration, Limit-setting, Orientation, Problem-solving, Rapport Building, Dance movement psychotherapisteality Testing, Socialization and Support  ? Summary of Progress/Problems: The topic for group was balance in life. Today's group focused on defining balance in one's own words, identifying things that can knock one off balance, and exploring healthy ways to maintain balance in life. Group members were asked to provide an example of a time when they felt off balance, describe how they handled that situation, and process healthier ways to regain balance in the future. Group members were asked to share the most important tool for maintaining balance that they learned [facility] and how they plan to apply this method after discharge. Pt states that when his life is off balance he has more out of body experiences and feels "numb". Pt reported that he is able to journal his feelings to help him through this. Pt was very attentive throughout the group and offered supportive feedback to other group members.  Isaiah JordanLynn B Garner Dullea, MSW, LCSWA 09/16/16, 3:33 PM

## 2016-09-16 NOTE — [facility] Group Notes (Signed)
[facility] Group Notes:  (Nursing/MHT/Case Management/Adjunct)  Date:  09/16/2016  Time:  12:36 AM  Type of Therapy:  Group Therapy  Participation Level:  Active  Participation Quality:  Appropriate  Affect:  Appropriate  Cognitive:  Appropriate  Insight:  Appropriate  Engagement in Group:  Engaged  Modes of Intervention:  n/a  Summary of Progress/Problems:  Veva Holesshley Imani Shelli Portilla 09/16/2016, 12:36 AM

## 2016-09-16 NOTE — Progress Notes (Signed)
Pt presents pleasant and cooperative. Smiling and happy socializing with staff and peers. Pt reports to writer "I think I may be Bipolar, because on one hand, I feel pain, the other hand I feel numb" Pt denies auditory and visual hallucinations as well as SI and HI. Pt is med and group compliant. Appropriate with staff and peers.  Encouragement and support offered. Safety checks maintained. Medications given as prescribed.

## 2016-09-16 NOTE — Plan of Care (Signed)
Problem: Pain Managment: Goal: General experience of comfort will improve Outcome: Not Progressing Patient continues to complain of back pain

## 2016-09-16 NOTE — [facility] Group Notes (Signed)
[facility] Group Notes:  (Nursing/MHT/Case Management/Adjunct)  Date:  09/16/2016  Time:  11:22 PM  Type of Therapy:  Group Therapy  Participation Level:  Active  Participation Quality:  Appropriate  Affect:  Appropriate  Cognitive:  Appropriate  Insight:  Good and Improving  Engagement in Group:  Developing/Improving  Modes of Intervention:  Discussion  Summary of Progress/Problems: Pt stated that his goal is to build a better relationship with his family. He feels that they don't understand his illness and some group counseling would help. Pt admits that he needs to stay compliant with his medications in order to work on their relationship.   Fanny Skatesshley Imani Yachet Mattson 09/16/2016, 11:22 PM

## 2016-09-17 ENCOUNTER — Encounter: Payer: Self-pay | Admitting: Psychiatry

## 2016-09-17 MED ORDER — DICLOFENAC SODIUM 1 % TD GEL
4.0000 g | Freq: Four times a day (QID) | TRANSDERMAL | Status: DC
  Administered 2016-09-17 – 2016-09-22 (×19): 4 g via TOPICAL
  Filled 2016-09-17: qty 100

## 2016-09-17 MED ORDER — TEMAZEPAM 15 MG PO CAPS
15.0000 mg | ORAL_CAPSULE | Freq: Every day | ORAL | Status: DC
Start: 2016-09-17 — End: 2016-09-21
  Administered 2016-09-17 – 2016-09-20 (×4): 15 mg via ORAL
  Filled 2016-09-17 (×5): qty 1

## 2016-09-17 MED ORDER — OXCARBAZEPINE 300 MG PO TABS
300.0000 mg | ORAL_TABLET | Freq: Two times a day (BID) | ORAL | Status: DC
  Administered 2016-09-17 – 2016-09-22 (×10): 300 mg via ORAL
  Filled 2016-09-17 (×10): qty 1

## 2016-09-17 MED ORDER — PALIPERIDONE PALMITATE 234 MG/1.5ML IM SUSP
234.0000 mg | INTRAMUSCULAR | Status: DC
  Administered 2016-09-17: 234 mg via INTRAMUSCULAR
  Filled 2016-09-17: qty 1.5

## 2016-09-17 MED ORDER — HYDROXYZINE HCL 50 MG PO TABS
50.0000 mg | ORAL_TABLET | Freq: Three times a day (TID) | ORAL | Status: DC | PRN
  Administered 2016-09-17 – 2016-09-19 (×3): 50 mg via ORAL
  Filled 2016-09-17 (×3): qty 1

## 2016-09-17 NOTE — [facility] Group Notes (Signed)
[facility] Group Notes:  (Nursing/MHT/Case Management/Adjunct)  Date:  09/17/2016  Time:  3:57 PM  Type of Therapy:  Psychoeducational Skills  Participation Level:  Active  Participation Quality:  Appropriate  Affect:  Appropriate  Cognitive:  Appropriate  Insight:  Appropriate  Engagement in Group:  Engaged  Modes of Intervention:  Activity, Discussion and Education  Summary of Progress/Problems:  Isaiah Farberamela M Moses Garza 09/17/2016, 3:57 PM

## 2016-09-17 NOTE — Plan of Care (Signed)
Problem: Education: Goal: Knowledge of the prescribed therapeutic regimen will improve Outcome: Progressing Patient is medication compliant

## 2016-09-17 NOTE — Progress Notes (Signed)
D: Patient's appears euphoric. Less paranoid about medications. Denies SI/HI/AVH. Patient has been visible in the milieu and interacts well with peers.  A: Medication given with education. Encouragement provided.  R: Patient was compliant with medication. He remains calm and cooperative. Safety maintained with 15 min checks.

## 2016-09-17 NOTE — Plan of Care (Signed)
Problem: Coping: Goal: Ability to verbalize feelings will improve Outcome: Progressing Patient verbalized feelings well in group and how he feels about his family.

## 2016-09-17 NOTE — Progress Notes (Addendum)
[facility] MD Progress Note  09/17/2016 8:58 AM Isaiah Garza  MRN:  [id number]  Subjective:  "I'm feeling great, I'm starting to plan [place]."  12/28 Follow-up note for this 25 year old man with bipolar disorder. Patient was seen today and also met with the treatment team. He seems to be making real progress. Although he is still somewhat euphoric and a little hyperactive he is much less bizarre in his thinking. He was actually able to hold an appropriate conversation and acknowledge that he has bipolar disorder. He did not have any specific new complaints. He is sleeping better. Denies any suicidal or homicidal ideation.  12/29 Mr. Bottoms complains of feeling and energetic today. He keeps saying that he feels like he is dying but denies wanting to hurt himself. He has been taking his medications but is very adamant that he is not going to continue lithium at home. He also complains of pain all over and believes that lithium is exacerbating his pain. He's been out in the community interacting with peers and staff appropriately.  Per nursing: D: Patient's appears euphoric. Less paranoid about medications. Denies SI/HI/AVH. Patient has been visible in the milieu and interacts well with peers.  A: Medication given with education. Encouragement provided.  R: Patient was compliant with medication. He remains calm and cooperative. Safety maintained with 15 min checks.   Principal Problem: Bipolar affective disorder, manic, severe, with psychotic behavior ([place]) Diagnosis:   Patient Active Problem List   Diagnosis Date Noted  . Bipolar affective disorder, manic, severe, with psychotic behavior ([place]) [F31.2] 09/14/2016  . Cannabis abuse [F12.10] 09/14/2016  . Noncompliance [Z91.19] 09/14/2016  . Bipolar affective disorder, manic, severe ([place]) [F31.13] 09/13/2016   Total Time spent with patient: 20 minutes  Past Psychiatric History: Patient has a past history of mood disorder psychotic symptoms also a past  history of noncompliance and substance abuse. Referred to the hospital because of manic psychotic behavior  Past Medical History:  Past Medical History:  Diagnosis Date  . ADD (attention deficit disorder)   . Allergy   . Asthma   . Bipolar 1 disorder ([place])    History reviewed. No pertinent surgical history. Family History:  Family History  Problem Relation Age of Onset  . Cancer Father     testicular  . Diabetes Neg Hx   . Heart disease Neg Hx   . Early death Neg Hx   . Stroke Neg Hx    Family Psychiatric  History: No known family history Social History:  History  Alcohol Use  . 1.2 oz/week  . 1 Cans of beer, 1 Shots of liquor per week    Comment: social - only about 3 drinks     History  Drug Use No    Social History   Social History  . Marital status: Single    Spouse name: N/A  . Number of children: N/A  . Years of education: N/A   Occupational History  . cook    Social History Main Topics  . Smoking status: Former Research scientist (life sciences)  . Smokeless tobacco: Never Used  . Alcohol use 1.2 oz/week    1 Cans of beer, 1 Shots of liquor per week     Comment: social - only about 3 drinks  . Drug use: No  . Sexual activity: Yes   Other Topics Concern  . None   Social History Narrative   Exercise - assorted (walking, running, cycling, and cardio)   Additional Social History:  Sleep: Fair  Appetite:  Good  Current Medications: Current Facility-Administered Medications  Medication Dose Route Frequency Provider Last Rate Last Dose  . acetaminophen (TYLENOL) tablet 650 mg  650 mg Oral Q4H PRN [name], NP   650 mg at 09/16/16 2033  . alum & mag hydroxide-simeth (MAALOX/MYLANTA) 200-200-20 MG/5ML suspension 30 mL  30 mL Oral PRN [name], NP      . carbamide peroxide (DEBROX) 6.5 % otic solution 5 drop  5 drop Left Ear BID Hildred Priest, MD   5 drop at 09/17/16 0814  . ibuprofen (ADVIL,MOTRIN) tablet 600 mg  600 mg  Oral Q8H PRN [name], NP   600 mg at 09/17/16 2111  . lithium carbonate (LITHOBID) CR tablet 600 mg  600 mg Oral Q12H [name], NP   600 mg at 09/17/16 5520  . magnesium hydroxide (MILK OF MAGNESIA) suspension 30 mL  30 mL Oral Daily PRN [name], NP      . neomycin-polymyxin-hydrocortisone (CORTISPORIN) otic suspension 1 drop  1 drop Left Ear BID Hildred Priest, MD   1 drop at 09/17/16 0813  . ondansetron (ZOFRAN) tablet 4 mg  4 mg Oral Q8H PRN [name], NP      . paliperidone (INVEGA) 24 hr tablet 6 mg  6 mg Oral Daily [name], MD   6 mg at 09/17/16 8022  . zolpidem (AMBIEN) tablet 5 mg  5 mg Oral QHS PRN [name], NP   5 mg at 09/16/16 2139    Lab Results: No results found for this or any previous visit (from the past 48 hour(s)).  Blood Alcohol level:  Lab Results  Component Value Date   ETH <5 09/12/2016   ETH 246 (H) 33/61/2244    Metabolic Disorder Labs: No results found for: HGBA1C, MPG No results found for: PROLACTIN Lab Results  Component Value Date   CHOL 151 01/16/2015   TRIG 43 01/16/2015   HDL 63 01/16/2015   CHOLHDL 2.4 01/16/2015   VLDL 9 01/16/2015   LDLCALC 79 01/16/2015    Physical Findings: AIMS:  , ,  ,  ,    CIWA:    COWS:     Musculoskeletal: Strength & Muscle Tone: within normal limits Gait & Station: normal Patient leans: N/A  Psychiatric Specialty Exam: Physical Exam  Nursing note and vitals reviewed. Constitutional: He appears well-developed and well-nourished.  HENT:  Head: Normocephalic and atraumatic.  Eyes: Conjunctivae are normal. Pupils are equal, round, and reactive to light.  Neck: Normal range of motion.  Cardiovascular: Regular rhythm and normal heart sounds.   Respiratory: Effort normal.  GI: Soft.  Musculoskeletal: Normal range of motion.  Neurological: He is alert.  Skin: Skin is warm and dry.  Psychiatric: His speech is normal and behavior is normal. His affect is labile.  Thought content is not paranoid and not delusional. He expresses impulsivity. He expresses no homicidal and no suicidal ideation. He exhibits abnormal recent memory.    Review of Systems  Constitutional: Negative.   HENT: Negative.   Eyes: Negative.   Respiratory: Negative.   Cardiovascular: Negative.   Gastrointestinal: Negative.   Musculoskeletal: Negative.   Skin: Negative.   Neurological: Negative.   Psychiatric/Behavioral: Negative for depression, hallucinations, memory loss, substance abuse and suicidal ideas. The patient is not nervous/anxious and does not have insomnia.     Blood pressure 130/70, pulse 61, temperature 98.3 F (36.8 C), temperature source Oral, resp.  rate 18, height 6' 3"  (1.905 m), weight 76.2 kg (168 lb), SpO2 100 %.Body mass index is 21 kg/m.  General Appearance: Disheveled  Eye Contact:  Fair  Speech:  Normal Rate  Volume:  Normal  Mood:  Euphoric  Affect:  Congruent  Thought Process:  Coherent  Orientation:  Full (Time, Place, and Person)  Thought Content:  Tangential  Suicidal Thoughts:  No  Homicidal Thoughts:  No  Memory:  Immediate;   Good Recent;   Fair Remote;   Fair  Judgement:  Fair  Insight:  Fair  Psychomotor Activity:  Normal  Concentration:  Concentration: Poor  Recall:  [place] of Knowledge:  Good  Language:  Good  Akathisia:  No  Handed:  Right  AIMS (if indicated):     Assets:  Communication Skills Housing Physical Health Social Support  ADL's:  Intact  Cognition:  Impaired,  Mild  Sleep:  Number of Hours: 7.25     Treatment Plan Summary: Daily contact with patient to assess and evaluate symptoms and progress in treatment, Medication management and Plan Patient has been compliant with medication. He is principally being treated with lithium and Invega, which I chose hoping that he could ultimately be put on a long-acting injectable. So far he has been compliant with medication. I am not changing any of the orders at  this point. I have placed an order for a lithium level to be drawn the morning of December 30. Patient encouraged to continue participation in groups and medication compliance. He may be getting closer to discharge. Estimated length of stay probably in the range of 3-4 days.   Mr. Talcott is a 25 year old male with a history of bipolar disorder admitted for psychotic break.  1. Bipolar disorder. He is treated with lithium and Invega. He wants to discontinue lithium and switch to Tegretol. We will continue Lithium for now and take lithium level in the morning. We will add Trileptal 300 mg twice daily just in case he refuses Lithium. He agreed to take [place] injection today on 12/29.  2. Insomnia. He does not feel rested with Ambien. We will switch to Restoril.  3. Substance abuse. The patient was positive for cannabis on admission. He minimizes his problems and declines treatment.   4. Metabolic syndrome monitoring. Lipid profile, TSH, and hemoglobin A1c are pending.  5. Chronic pain. He complains of back and neck pain from scoliosis. Will give Voltaren gel.   6. Disposition. He will be discharged to home. He will follow up with his regular psychiatrist.  Orson Slick, MD 09/17/2016, 8:58 AM

## 2016-09-17 NOTE — [facility] Group Notes (Signed)
[facility] LCSW Group Therapy  09/17/2016 2:41 PM  Type of Therapy:  Group Therapy  Participation Level:  Active  Participation Quality:  Attentive  Affect:  Appropriate  Cognitive:  Appropriate  Insight:  Engaged  Engagement in Therapy:  Developing/Improving  Modes of Intervention:  Activity, Clarification, Discussion, Education, Exploration and Socialization   Summary of Progress/Problems: The topic for today was feelings about relapse. Pt discussed what relapse prevention is to them and identified triggers that they are on the path to relapse. Pt processed their feeling towards relapse and was able to relate to peers. Pt discussed coping skills that can be used for relapse prevention. This patient likes to be called Isaiah [name] and he also is very spiritual, he was redirected to speak to [name] in the religion group.   Isaiah SenateBandi, Isaiah Garza 09/17/2016, 2:41 PM

## 2016-09-18 LAB — BASIC METABOLIC PANEL
ANION GAP: 5 (ref 5–15)
BUN: 9 mg/dL (ref 6–20)
CO2: 30 mmol/L (ref 22–32)
Calcium: 9.5 mg/dL (ref 8.9–10.3)
Chloride: 103 mmol/L (ref 101–111)
Creatinine, Ser: 1 mg/dL (ref 0.61–1.24)
GFR calc Af Amer: >60 mL/min (ref >60)
GFR calc non Af Amer: >60 mL/min (ref >60)
GLUCOSE: 136 mg/dL — AB (ref 65–99)
POTASSIUM: 4.1 mmol/L (ref 3.5–5.1)
Sodium: 138 mmol/L (ref 135–145)

## 2016-09-18 LAB — LIPID PANEL
CHOL/HDL RATIO: 2.5 ratio
Cholesterol: 109 mg/dL (ref 0–200)
HDL: 43 mg/dL (ref >40)
LDL CALC: 47 mg/dL (ref 0–99)
TRIGLYCERIDES: 95 mg/dL (ref <150)
VLDL: 19 mg/dL (ref 0–40)

## 2016-09-18 LAB — TSH: TSH: 1.448 u[IU]/mL (ref 0.350–4.500)

## 2016-09-18 LAB — LITHIUM LEVEL: LITHIUM LVL: 1.2 mmol/L (ref 0.60–1.20)

## 2016-09-18 MED ORDER — LITHIUM CARBONATE ER 300 MG PO TBCR
600.0000 mg | EXTENDED_RELEASE_TABLET | Freq: Every morning | ORAL | Status: DC
  Administered 2016-09-19: 600 mg via ORAL
  Filled 2016-09-18: qty 2

## 2016-09-18 NOTE — Progress Notes (Signed)
Pt denies SI/HI/AVH. Mostly isolated to room this shift. Pt was awoken for evening medication pass. Pt not euphoric as noted previously. Medication compliant. Appropriate during interaction. Denies pain and voices no additional concerns at this time. Safety maintained. Will continue to monitor.

## 2016-09-18 NOTE — [facility] Group Notes (Signed)
[facility] Group Notes:  (Nursing/MHT/Case Management/Adjunct)  Date:  09/18/2016  Time:  5:55 AM  Type of Therapy:  Psychoeducational Skills  Participation Level:  Active  Participation Quality:  Appropriate, Attentive and Sharing  Affect:  Appropriate  Cognitive:  Appropriate  Insight:  Appropriate and Good  Engagement in Group:  Engaged  Modes of Intervention:  Discussion, Socialization and Support  Summary of Progress/Problems:  Isaiah Garza 09/18/2016, 5:55 AM

## 2016-09-18 NOTE — Progress Notes (Signed)
Pt has been seclusive to his room. Pt denies SI and A/V hallucinations. Pt has been compliant with taking his po meds. Pt's mood and affect has been depressed. Limited interactions noted verbally with both staff and peers.

## 2016-09-18 NOTE — Progress Notes (Signed)
Denies SI/HI/AVH. Pleasant during interaction. Cooperative with medications and plan of care. Visible in milieu with appropriate behaviors. Affect depressed. Voices no additional concerns at this time. Safety maintained. Will continue to monitor.

## 2016-09-18 NOTE — Progress Notes (Addendum)
[facility] MD Progress Note  09/18/2016 10:32 AM Isaiah Garza  MRN:  [id number]  Subjective:  "I'm feeling great, I'm starting to plan [place]."  12/28 Follow-up note for this 25 year old man with bipolar disorder. Patient was seen today and also met with the treatment team. He seems to be making real progress. Although he is still somewhat euphoric and a little hyperactive he is much less bizarre in his thinking. He was actually able to hold an appropriate conversation and acknowledge that he has bipolar disorder. He did not have any specific new complaints. He is sleeping better. Denies any suicidal or homicidal ideation.  12/29 [name] complains of feeling and energetic today. He keeps saying that he feels like he is dying but denies wanting to hurt himself. He has been taking his medications but is very adamant that he is not going to continue lithium at home. He also complains of pain all over and believes that lithium is exacerbating his pain. He's been out in the community interacting with peers and staff appropriately.  09/18/16: The patient reports that he is feeling somewhat lethargic and not very energetic today. He is no longer euphoric and thought processes are more organized. He denies any current active or passive suicidal thoughts but is expressing some depressive symptoms. He also admits to high levels of anxiety. He says groups make him anxious as he finds it hard to focus. He however denies any racing thoughts interfering with focus and concentration. He does admit to social anxiety and has a difficult time interacting with others. He denies any auditory or visual hallucinations. He does still have some suspicions about the medications including lithium and does not want to take the lithium after he leaves the hospital. He is however been compliant with all the medications including lithium and denies any physical adverse side effects other than the lethargy. Vital signs have been stable. He  is sleeping fairly well and denies any problems with his appetite. He does admit to marijuana use outside of the hospital but minimizes the use and says it only occurs every few months. He is not interested in any substance abuse treatment. His mom has been visiting him on the unit regularly.      Principal Problem: Bipolar affective disorder, manic, severe, with psychotic behavior ([place]) Diagnosis:   Patient Active Problem List   Diagnosis Date Noted  . Bipolar affective disorder, manic, severe, with psychotic behavior ([place]) [F31.2] 09/14/2016  . Cannabis use disorder, moderate, dependence ([place]) [F12.20] 09/14/2016  . Noncompliance [Z91.19] 09/14/2016  . Bipolar affective disorder, manic, severe ([place]) [F31.13] 09/13/2016   Total Time spent with patient: 20 minutes  Past Psychiatric History: Patient has a past history of mood disorder psychotic symptoms also a past history of noncompliance and substance abuse. Referred to the hospital because of manic psychotic behavior  Past Medical History:  Past Medical History:  Diagnosis Date  . ADD (attention deficit disorder)   . Allergy   . Asthma   . Bipolar 1 disorder ([place])    History reviewed. No pertinent surgical history. Family History:  Family History  Problem Relation Age of Onset  . Cancer Father     testicular  . Diabetes Neg Hx   . Heart disease Neg Hx   . Early death Neg Hx   . Stroke Neg Hx    Family Psychiatric  History: No known family history Social History:  History  Alcohol Use  . 1.2 oz/week  . 1 Cans of  beer, 1 Shots of liquor per week    Comment: social - only about 3 drinks     History  Drug Use No    Social History   Social History  . Marital status: Single    Spouse name: N/A  . Number of children: N/A  . Years of education: N/A   Occupational History  . cook    Social History Main Topics  . Smoking status: Former Research scientist (life sciences)  . Smokeless tobacco: Never Used  . Alcohol use 1.2 oz/week    1 Cans  of beer, 1 Shots of liquor per week     Comment: social - only about 3 drinks  . Drug use: No  . Sexual activity: Yes   Other Topics Concern  . None   Social History Narrative   Exercise - assorted (walking, running, cycling, and cardio)     Sleep: Good  Appetite:  Good  Current Medications: Current Facility-Administered Medications  Medication Dose Route Frequency Provider Last Rate Last Dose  . acetaminophen (TYLENOL) tablet 650 mg  650 mg Oral Q4H PRN [name], NP   650 mg at 09/16/16 2033  . alum & mag hydroxide-simeth (MAALOX/MYLANTA) 200-200-20 MG/5ML suspension 30 mL  30 mL Oral PRN [name], NP      . carbamide peroxide (DEBROX) 6.5 % otic solution 5 drop  5 drop Left Ear BID Hildred Priest, MD   5 drop at 09/18/16 0848  . diclofenac sodium (VOLTAREN) 1 % transdermal gel 4 g  4 g Topical QID Clovis Fredrickson, MD   4 g at 09/18/16 0848  . hydrOXYzine (ATARAX/VISTARIL) tablet 50 mg  50 mg Oral TID PRN Clovis Fredrickson, MD   50 mg at 09/17/16 1608  . ibuprofen (ADVIL,MOTRIN) tablet 600 mg  600 mg Oral Q8H PRN [name], NP   600 mg at 09/17/16 0045  . lithium carbonate (LITHOBID) CR tablet 600 mg  600 mg Oral Q12H [name], NP   600 mg at 09/18/16 0846  . magnesium hydroxide (MILK OF MAGNESIA) suspension 30 mL  30 mL Oral Daily PRN [name], NP      . neomycin-polymyxin-hydrocortisone (CORTISPORIN) otic suspension 1 drop  1 drop Left Ear BID Hildred Priest, MD   1 drop at 09/18/16 0846  . ondansetron (ZOFRAN) tablet 4 mg  4 mg Oral Q8H PRN [name], NP      . Oxcarbazepine (TRILEPTAL) tablet 300 mg  300 mg Oral BID Clovis Fredrickson, MD   300 mg at 09/18/16 0847  . paliperidone (INVEGA SUSTENNA) injection 234 mg  234 mg Intramuscular Q28 days Clovis Fredrickson, MD   234 mg at 09/17/16 1609  . paliperidone (INVEGA) 24 hr tablet 6 mg  6 mg Oral Daily [name], MD   6 mg at 09/18/16 0846  . temazepam  (RESTORIL) capsule 15 mg  15 mg Oral QHS Clovis Fredrickson, MD   15 mg at 09/17/16 2151    Lab Results:  Results for orders placed or performed during the hospital encounter of 09/13/16 (from the past 48 hour(s))  Lithium level     Status: None   Collection Time: 09/18/16  8:17 AM  Result Value Ref Range   Lithium Lvl 1.20 0.60 - 1.20 mmol/L  Lipid panel     Status: None   Collection Time: 09/18/16  8:17 AM  Result Value Ref Range   Cholesterol 109 0 - 200 mg/dL  Triglycerides 95 <150 mg/dL   HDL 43 >40 mg/dL   Total CHOL/HDL Ratio 2.5 RATIO   VLDL 19 0 - 40 mg/dL   LDL Cholesterol 47 0 - 99 mg/dL    Comment:        Total Cholesterol/HDL:CHD Risk Coronary Heart Disease Risk Table                     Men   Women  1/2 Average Risk   3.4   3.3  Average Risk       5.0   4.4  2 X Average Risk   9.6   7.1  3 X Average Risk  23.4   11.0        Use the calculated Patient Ratio above and the CHD Risk Table to determine the patient's CHD Risk.        ATP III CLASSIFICATION (LDL):  <100     mg/dL   Optimal  100-129  mg/dL   Near or Above                    Optimal  130-159  mg/dL   Borderline  160-189  mg/dL   High  >190     mg/dL   Very High   TSH     Status: None   Collection Time: 09/18/16  8:17 AM  Result Value Ref Range   TSH 1.448 0.350 - 4.500 uIU/mL    Comment: Performed by a 3rd Generation assay with a functional sensitivity of <=0.01 uIU/mL.    Blood Alcohol level:  Lab Results  Component Value Date   ETH <5 09/12/2016   ETH 246 (H) 94/03/6807    Metabolic Disorder Labs: No results found for: HGBA1C, MPG No results found for: PROLACTIN Lab Results  Component Value Date   CHOL 109 09/18/2016   TRIG 95 09/18/2016   HDL 43 09/18/2016   CHOLHDL 2.5 09/18/2016   VLDL 19 09/18/2016   LDLCALC 47 09/18/2016   LDLCALC 79 01/16/2015    Physical Findings: AIMS:  , ,  ,  ,    CIWA:    COWS:     Musculoskeletal: Strength & Muscle Tone: within normal  limits Gait & Station: normal Patient leans: N/A  Psychiatric Specialty Exam: Physical Exam  Nursing note and vitals reviewed. Constitutional: He appears well-developed and well-nourished.  HENT:  Head: Normocephalic and atraumatic.  Eyes: Conjunctivae are normal. Pupils are equal, round, and reactive to light.  Neck: Normal range of motion.  Cardiovascular: Regular rhythm and normal heart sounds.   Respiratory: Effort normal.  GI: Soft.  Musculoskeletal: Normal range of motion.  Neurological: He is alert.  Skin: Skin is warm and dry.  Psychiatric: His speech is normal and behavior is normal. His affect is labile. Thought content is not paranoid and not delusional. He expresses impulsivity. He expresses no homicidal and no suicidal ideation. He exhibits abnormal recent memory.    Review of Systems  Constitutional: Negative.  Negative for chills, fever and weight loss.  HENT: Negative.  Negative for congestion, ear pain, hearing loss, sore throat and tinnitus.   Eyes: Negative.  Negative for double vision, discharge and redness.  Respiratory: Negative.  Negative for cough, sputum production, shortness of breath and wheezing.   Cardiovascular: Negative.   Gastrointestinal: Negative.   Genitourinary: Negative for dysuria.  Musculoskeletal: Negative.  Negative for back pain, falls, joint pain, myalgias and neck pain.  Skin: Negative.  Negative for itching  and rash.  Neurological: Negative.  Negative for dizziness, tingling, tremors and headaches.  Psychiatric/Behavioral: Positive for depression and substance abuse. The patient is nervous/anxious.     Blood pressure 119/74, pulse 71, temperature 97.9 F (36.6 C), temperature source Oral, resp. rate 20, height 6' 3"  (1.905 m), weight 76.2 kg (168 lb), SpO2 100 %.Body mass index is 21 kg/m.  General Appearance: Disheveled  Eye Contact:  Fair  Speech:  Normal Rate  Volume:  Normal  Mood:  Depressed  Affect:  Tired and lethargic   Thought Process:  Coherent  Orientation:  Full (Time, Place, and Person)  Thought Content:  Tangential  Suicidal Thoughts:  No  Homicidal Thoughts:  No  Memory:  Immediate;   Good Recent;   Fair Remote;   Fair  Judgement:  Fair  Insight:  Fair  Psychomotor Activity:  Normal  Concentration:  Concentration: Poor  Recall:  [place] of Knowledge:  Good  Language:  Good  Akathisia:  No  Handed:  Right  AIMS (if indicated):     Assets:  Communication Skills Housing Physical Health Social Support  ADL's:  Intact  Cognition:  Impaired,  Mild  Sleep:  Number of Hours: 7.3     Treatment Plan Summary: Daily contact with patient to assess and evaluate symptoms and progress in treatment, Medication management and Plan Patient has been compliant with medication. He is principally being treated with lithium and Invega, which I chose hoping that he could ultimately be put on a long-acting injectable. So far he has been compliant with medication. I am not changing any of the orders at this point. I have placed an order for a lithium level to be drawn the morning of December 30. Patient encouraged to continue participation in groups and medication compliance. He may be getting closer to discharge. Estimated length of stay probably in the range of 3-4 days.   Mr. Harcum is a 25 year old male with a history of bipolar disorder admitted for psychotic break.  1. Bipolar disorder. He is treated with lithium and Invega. He wants to discontinue lithium and switch to Tegretol. We will continue Lithium for now. Lithium level was 1.2 and BMP is pending. Creatinine was last elevated at 1.47 and repeat is pending.. Will reduce Lithium to 636m once a day and discontinue all together if Creatinine is still elevated. AddedTrileptal 300 mg twice daily just in case he refuses Lithium. He agreed to take [place] today on 12[place]29 and will continue crossover with oral Invega 647mpo nightly. The patient  also has hydroxyzine 50 mg by mouth 3 times a day as needed for anxiety. He is no longer manic.  2. Cannabis Use Disorder: The patient was advised to abstain from marijuana and all illicit drugs as they may worsen psychosis.He minimizes marijuana use and is not interested in any substance abuse treatment  3. Insomnia. He  failed Ambien and was started on temazepam 15 mg by mouth nightly  4. Metabolic syndrome monitoring. Total cholesterol was 109. HgA1c is pending. Prolactin level is ordered  5. Chronic pain. He complains of back and neck pain from scoliosis. He has Voltaren gel.   6. Disposition. He will be discharged to home. He will follow up for outpatient psychotropic medication management   KAJay SchlichterMD 09/18/2016, 10:32 AM

## 2016-09-18 NOTE — [facility] Group Notes (Signed)
Patient did not attend therapeutic group  Isaiah Watterson LCSW [phone]  

## 2016-09-19 LAB — HEMOGLOBIN A1C
Hgb A1c MFr Bld: 5.3 % (ref 4.8–5.6)
Mean Plasma Glucose: 105 mg/dL

## 2016-09-19 MED ORDER — FLUVOXAMINE MALEATE 50 MG PO TABS
100.0000 mg | ORAL_TABLET | Freq: Every day | ORAL | Status: DC
  Administered 2016-09-20 – 2016-09-21 (×2): 100 mg via ORAL
  Filled 2016-09-19 (×2): qty 2

## 2016-09-19 MED ORDER — FLUVOXAMINE MALEATE 50 MG PO TABS
50.0000 mg | ORAL_TABLET | Freq: Every day | ORAL | Status: AC
  Administered 2016-09-19: 50 mg via ORAL
  Filled 2016-09-19: qty 1

## 2016-09-19 MED ORDER — FLUVOXAMINE MALEATE 50 MG PO TABS: 50.0000 mg | ORAL_TABLET | Freq: Every day | ORAL | Status: DC

## 2016-09-19 MED ORDER — PALIPERIDONE PALMITATE 156 MG/ML IM SUSP
156.0000 mg | Freq: Once | INTRAMUSCULAR | Status: AC
  Administered 2016-09-21: 156 mg via INTRAMUSCULAR
  Filled 2016-09-19: qty 1

## 2016-09-19 NOTE — Progress Notes (Signed)
Pt denies SI/HI/AVH. Mostly isolated to room this shift and was in bed. Pt was awoken for evening medication pass. Refused Restoril and stated "hopefully, I will be able to get out of bed in the a.m.". Medication compliant. Appropriate during interaction. Denies pain and voices no additional concerns at this time. Safety maintained. Will continue to monitor

## 2016-09-19 NOTE — Plan of Care (Signed)
Problem: Activity: Goal: Sleeping patterns will improve Outcome: Progressing Pt has been sleeping more than 6 hrs per night.

## 2016-09-19 NOTE — Progress Notes (Signed)
[facility] MD Progress Note  09/19/2016 12:50 PM Isaiah Garza  MRN:  [id number]  Subjective:  "I'm feeling great, I'm starting to plan [place]."  12/28 Follow-up note for this 25 year old man with bipolar disorder. Patient was seen today and also met with the treatment team. He seems to be making real progress. Although he is still somewhat euphoric and a little hyperactive he is much less bizarre in his thinking. He was actually able to hold an appropriate conversation and acknowledge that he has bipolar disorder. He did not have any specific new complaints. He is sleeping better. Denies any suicidal or homicidal ideation.  12/29 Isaiah Garza complains of feeling and energetic today. He keeps saying that he feels like he is dying but denies wanting to hurt himself. He has been taking his medications but is very adamant that he is not going to continue lithium at home. He also complains of pain all over and believes that lithium is exacerbating his pain. He's been out in the community interacting with peers and staff appropriately.  09/18/16: The patient reports that he is feeling somewhat lethargic and not very energetic today. He is no longer euphoric and thought processes are more organized. He denies any current active or passive suicidal thoughts but is expressing some depressive symptoms. He also admits to high levels of anxiety. He says groups make him anxious as he finds it hard to focus. He however denies any racing thoughts interfering with focus and concentration. He does admit to social anxiety and has a difficult time interacting with others. He denies any auditory or visual hallucinations. He does still have some suspicions about the medications including lithium and does not want to take the lithium after he leaves the hospital. He is however been compliant with all the medications including lithium and denies any physical adverse side effects other than the lethargy. Vital signs have been stable. He  is sleeping fairly well and denies any problems with his appetite. He does admit to marijuana use outside of the hospital but minimizes the use and says it only occurs every few months. He is not interested in any substance abuse treatment. His mom has been visiting him on the unit regularly.  12/31 Isaiah Garza feels sedated today. He is still asking to discontinue Lithium. He wants to keel Trileptal and Invega. His second injection of Invega will be on 09/22/2015.  He is asking to start Luvox for OCD. There are no somatic complaints. He is mostly in bed this morning.  Per nursing: Pt denies SI/HI/AVH. Mostly isolated to room this shift. Pt was awoken for evening medication pass. Pt not euphoric as noted previously. Medication compliant. Appropriate during interaction. Denies pain and voices no additional concerns at this time. Safety maintained. Will continue to monitor.  Principal Problem: Bipolar affective disorder, manic, severe, with psychotic behavior ([place]) Diagnosis:   Patient Active Problem List   Diagnosis Date Noted  . Bipolar affective disorder, manic, severe, with psychotic behavior ([place]) [F31.2] 09/14/2016  . Cannabis use disorder, moderate, dependence ([place]) [F12.20] 09/14/2016  . Noncompliance [Z91.19] 09/14/2016  . Bipolar affective disorder, manic, severe ([place]) [F31.13] 09/13/2016   Total Time spent with patient: 20 minutes  Past Psychiatric History: Patient has a past history of mood disorder psychotic symptoms also a past history of noncompliance and substance abuse. Referred to the hospital because of manic psychotic behavior  Past Medical History:  Past Medical History:  Diagnosis Date  . ADD (attention deficit disorder)   .  Allergy   . Asthma   . Bipolar 1 disorder ([place])    History reviewed. No pertinent surgical history. Family History:  Family History  Problem Relation Age of Onset  . Cancer Father     testicular  . Diabetes Neg Hx   . Heart disease Neg Hx   .  Early death Neg Hx   . Stroke Neg Hx    Family Psychiatric  History: No known family history Social History:  History  Alcohol Use  . 1.2 oz/week  . 1 Cans of beer, 1 Shots of liquor per week    Comment: social - only about 3 drinks     History  Drug Use No    Social History   Social History  . Marital status: Single    Spouse name: N/A  . Number of children: N/A  . Years of education: N/A   Occupational History  . cook    Social History Main Topics  . Smoking status: Former Research scientist (life sciences)  . Smokeless tobacco: Never Used  . Alcohol use 1.2 oz/week    1 Cans of beer, 1 Shots of liquor per week     Comment: social - only about 3 drinks  . Drug use: No  . Sexual activity: Yes   Other Topics Concern  . None   Social History Narrative   Exercise - assorted (walking, running, cycling, and cardio)     Sleep: Good  Appetite:  Good  Current Medications: Current Facility-Administered Medications  Medication Dose Route Frequency Provider Last Rate Last Dose  . acetaminophen (TYLENOL) tablet 650 mg  650 mg Oral Q4H PRN [name], NP   650 mg at 09/16/16 2033  . alum & mag hydroxide-simeth (MAALOX/MYLANTA) 200-200-20 MG/5ML suspension 30 mL  30 mL Oral PRN [name], NP      . carbamide peroxide (DEBROX) 6.5 % otic solution 5 drop  5 drop Left Ear BID Hildred Priest, MD   5 drop at 09/19/16 0824  . diclofenac sodium (VOLTAREN) 1 % transdermal gel 4 g  4 g Topical QID Clovis Fredrickson, MD   4 g at 09/19/16 1211  . fluvoxaMINE (LUVOX) tablet 50 mg  50 mg Oral QHS Cullin Dishman B Samie Reasons, MD      . hydrOXYzine (ATARAX/VISTARIL) tablet 50 mg  50 mg Oral TID PRN Clovis Fredrickson, MD   50 mg at 09/18/16 2216  . ibuprofen (ADVIL,MOTRIN) tablet 600 mg  600 mg Oral Q8H PRN [name], NP   600 mg at 09/18/16 1315  . magnesium hydroxide (MILK OF MAGNESIA) suspension 30 mL  30 mL Oral Daily PRN [name], NP      . neomycin-polymyxin-hydrocortisone  (CORTISPORIN) otic suspension 1 drop  1 drop Left Ear BID Hildred Priest, MD   1 drop at 09/19/16 [phone]  . ondansetron (ZOFRAN) tablet 4 mg  4 mg Oral Q8H PRN [name], NP      . Oxcarbazepine (TRILEPTAL) tablet 300 mg  300 mg Oral BID Clovis Fredrickson, MD   300 mg at 09/19/16 0826  . [START ON 09/21/2016] paliperidone (INVEGA SUSTENNA) injection 156 mg  156 mg Intramuscular Once Serenity Batley B Areliz Rothman, MD      . paliperidone (INVEGA SUSTENNA) injection 234 mg  234 mg Intramuscular Q28 days Clovis Fredrickson, MD   234 mg at 09/17/16 1609  . paliperidone (INVEGA) 24 hr tablet 6 mg  6 mg Oral Daily [name], MD  6 mg at 09/19/16 0824  . temazepam (RESTORIL) capsule 15 mg  15 mg Oral QHS Clovis Fredrickson, MD   15 mg at 09/18/16 2216    Lab Results:  Results for orders placed or performed during the hospital encounter of 09/13/16 (from the past 48 hour(s))  Lithium level     Status: None   Collection Time: 09/18/16  8:17 AM  Result Value Ref Range   Lithium Lvl 1.20 0.60 - 1.20 mmol/L  Lipid panel     Status: None   Collection Time: 09/18/16  8:17 AM  Result Value Ref Range   Cholesterol 109 0 - 200 mg/dL   Triglycerides 95 <150 mg/dL   HDL 43 >40 mg/dL   Total CHOL/HDL Ratio 2.5 RATIO   VLDL 19 0 - 40 mg/dL   LDL Cholesterol 47 0 - 99 mg/dL    Comment:        Total Cholesterol/HDL:CHD Risk Coronary Heart Disease Risk Table                     Men   Women  1/2 Average Risk   3.4   3.3  Average Risk       5.0   4.4  2 X Average Risk   9.6   7.1  3 X Average Risk  23.4   11.0        Use the calculated Patient Ratio above and the CHD Risk Table to determine the patient's CHD Risk.        ATP III CLASSIFICATION (LDL):  <100     mg/dL   Optimal  100-129  mg/dL   Near or Above                    Optimal  130-159  mg/dL   Borderline  160-189  mg/dL   High  >190     mg/dL   Very High   TSH     Status: None   Collection Time: 09/18/16  8:17 AM  Result  Value Ref Range   TSH 1.448 0.350 - 4.500 uIU/mL    Comment: Performed by a 3rd Generation assay with a functional sensitivity of <=0.01 uIU/mL.  Basic metabolic panel     Status: Abnormal   Collection Time: 09/18/16  8:17 AM  Result Value Ref Range   Sodium 138 135 - 145 mmol/L   Potassium 4.1 3.5 - 5.1 mmol/L   Chloride 103 101 - 111 mmol/L   CO2 30 22 - 32 mmol/L   Glucose, Bld 136 (H) 65 - 99 mg/dL   BUN 9 6 - 20 mg/dL   Creatinine, Ser 1.00 0.61 - 1.24 mg/dL   Calcium 9.5 8.9 - 10.3 mg/dL   GFR calc non Af Amer >60 >60 mL/min   GFR calc Af Amer >60 >60 mL/min    Comment: (NOTE) The eGFR has been calculated using the CKD EPI equation. This calculation has not been validated in all clinical situations. eGFR's persistently <60 mL/min signify possible Chronic Kidney Disease.    Anion gap 5 5 - 15    Blood Alcohol level:  Lab Results  Component Value Date   ETH <5 09/12/2016   ETH 246 (H) 56/38/7564    Metabolic Disorder Labs: No results found for: HGBA1C, MPG No results found for: PROLACTIN Lab Results  Component Value Date   CHOL 109 09/18/2016   TRIG 95 09/18/2016   HDL 43 09/18/2016   CHOLHDL 2.5  09/18/2016   VLDL 19 09/18/2016   LDLCALC 47 09/18/2016   LDLCALC 79 01/16/2015    Physical Findings: AIMS:  , ,  ,  ,    CIWA:    COWS:     Musculoskeletal: Strength & Muscle Tone: within normal limits Gait & Station: normal Patient leans: N/A  Psychiatric Specialty Exam: Physical Exam  Nursing note and vitals reviewed. Constitutional: He appears well-developed and well-nourished.  HENT:  Head: Normocephalic and atraumatic.  Eyes: Conjunctivae are normal. Pupils are equal, round, and reactive to light.  Neck: Normal range of motion.  Cardiovascular: Regular rhythm and normal heart sounds.   Respiratory: Effort normal.  GI: Soft.  Musculoskeletal: Normal range of motion.  Neurological: He is alert.  Skin: Skin is warm and dry.  Psychiatric: His speech  is normal and behavior is normal. His affect is labile. Thought content is not paranoid and not delusional. He expresses impulsivity. He expresses no homicidal and no suicidal ideation. He exhibits abnormal recent memory.    Review of Systems  Constitutional: Negative.  Negative for chills, fever and weight loss.  HENT: Negative.  Negative for congestion, ear pain, hearing loss, sore throat and tinnitus.   Eyes: Negative.  Negative for double vision, discharge and redness.  Respiratory: Negative.  Negative for cough, sputum production, shortness of breath and wheezing.   Cardiovascular: Negative.   Gastrointestinal: Negative.   Genitourinary: Negative for dysuria.  Musculoskeletal: Negative.  Negative for back pain, falls, joint pain, myalgias and neck pain.  Skin: Negative.  Negative for itching and rash.  Neurological: Negative.  Negative for dizziness, tingling, tremors and headaches.  Psychiatric/Behavioral: Positive for depression and substance abuse. The patient is nervous/anxious.     Blood pressure 120/76, pulse 66, temperature 98.3 F (36.8 C), temperature source Oral, resp. rate 20, height _0  (1.905 m), weight 76.2 kg (168 lb), SpO2 100 %.Body mass index is 21 kg/m.  General Appearance: Disheveled  Eye Contact:  Fair  Speech:  Normal Rate  Volume:  Normal  Mood:  Depressed  Affect:  Tired and lethargic  Thought Process:  Coherent  Orientation:  Full (Time, Place, and Person)  Thought Content:  Tangential  Suicidal Thoughts:  No  Homicidal Thoughts:  No  Memory:  Immediate;   Good Recent;   Fair Remote;   Fair  Judgement:  Fair  Insight:  Fair  Psychomotor Activity:  Normal  Concentration:  Concentration: Poor  Recall:  [place] of Knowledge:  Good  Language:  Good  Akathisia:  No  Handed:  Right  AIMS (if indicated):     Assets:  Communication Skills Housing Physical Health Social Support  ADL's:  Intact  Cognition:  Impaired,  Mild  Sleep:  Number of  Hours: 6.45     Treatment Plan Summary: Daily contact with patient to assess and evaluate symptoms and progress in treatment, Medication management and Plan Patient has been compliant with medication. He is principally being treated with lithium and Invega, which I chose hoping that he could ultimately be put on a long-acting injectable. So far he has been compliant with medication. I am not changing any of the orders at this point. I have placed an order for a lithium level to be drawn the morning of December 30. Patient encouraged to continue participation in groups and medication compliance. He may be getting closer to discharge. Estimated length of stay probably in the range of 3-4 days.   Isaiah Garza is a 25 year old  male with a history of bipolar disorder admitted for psychotic break.  1. Bipolar disorder. He is treated with lithium and Invega for mood stabilization. Li level on 12/30 was 1.2. Cr was slightly elevated but is normal now. He wants to discontinue lithium and switch to Trileptal. I discontinued Lithium today. He agreed to take [place] injection on 12/29. Next injection on 1/2. The patient also has hydroxyzine 50 mg by mouth 3 times a day as needed for anxiety. He is no longer manic.  2. Cannabis Use Disorder: The patient was advised to abstain from marijuana and all illicit drugs as they may worsen psychosis.He minimizes marijuana use and is not interested in any substance abuse treatment  3. Insomnia. He  failed Ambien and was started on temazepam 15 mg by mouth nightly  4. Metabolic syndrome monitoring. Total cholesterol was 109, TSH normal, HgA1c and PRL are pending.  5. Chronic pain. He complains of back and neck pain from scoliosis. He has Voltaren gel.   6. Disposition. He will be discharged to home. He will follow up for outpatient psychotropic medication management   Orson Slick, MD 09/19/2016, 12:50 PM

## 2016-09-19 NOTE — [facility] Group Notes (Signed)
Participation Level:  Active  Participation Quality:  Attentive  Affect:  Appropriate  Cognitive:  Appropriate  Insight:  Engaged  Engagement in Therapy:  Developing/Improving  Modes of Intervention:  Activity, Clarification, Discussion, Education, Exploration and Socialization   The purpose of this group is to assist patients in learning to regulate negative emotions and experience positive emotions.  Patients will be guided to discuss ways in which they have been vulnerable to their negative emotions.  These vulnerabilities will be juxtaposed with experiences of positive emotions or situations, and  patients challenged to use positive emotions to combat negative ones.  Special emphasis will be placed on coping with negative emotions in conflict situations,  and patients will process healthy conflict resolution skills.    Therapeutic Goals:   1. Patient will identify two positive emotions or experiences to reflect on in order to balance out negative emotions:    2. Patient will label two or more emotions that they find the most difficult to experience:    3. Patient will be able to demonstrate positive conflict resolution skills through discussion or role plays:      Summary of Progress/Problems: The topic for today was Emotional Regulation Peers were asked to reflect on 1 incident where they were not in control of their emotions With facilitation from  group Leader those situations were reflected by patient and their peers.   Pt discussed coping skills that can be used for regulating emotions This patient was able to be supportive and this patients feelings was supported by this worker and peers Patient were then to review ways to improve thier emotions and to share options that assisted them in reducing thier own emotional disregulation ( taking medications, keep doctors appointmnets, create a daily routine,excercize, volunteer, call friend/family and several stress  reduction techniques were reviewed.)    Isaiah Garza, Isaiah Garza Dec 31st,2017 2:30pm

## 2016-09-19 NOTE — Progress Notes (Signed)
Pt has been seclusive to his room. Pt denies SI and A/V hallucinations. Pt has been compliant with taking his po meds. Pt's mood and affect has been depressed. Limited interactions noted verbally with both staff and peers. 

## 2016-09-19 NOTE — [facility] Group Notes (Signed)
[facility] Group Notes:  (Nursing/MHT/Case Management/Adjunct)  Date:  09/19/2016  Time:  5:41 AM  Type of Therapy:  Psychoeducational Skills  Participation Level:  Did Not Attend  Summary of Progress/Problems:  Isaiah MilroyLaquanda Y Shalamar Crays 09/19/2016, 5:41 AM

## 2016-09-20 NOTE — Progress Notes (Addendum)
D: Patient stated slept good last night .Stated appetite is good and energy level low. Stated concentration is poor . Stated on Depression scale  2, hopeless 2 and anxiety 3 .( low 0-10 high) Denies suicidal  homicidal ideations  .  No auditory hallucinations  No pain concerns . Appropriate ADL'S. Interacting with peers and staff. Voice of focus today is discharge  A: Encourage patient participation with unit programming . Instruction  Given on  Medication , verbalize understanding. R: Voice no other concerns. Staff continue to monitor

## 2016-09-20 NOTE — Plan of Care (Signed)
Problem: Coping: Goal: Ability to cope will improve Outcome: Progressing Working on coping skills  Handout given   

## 2016-09-20 NOTE — [facility] Group Notes (Signed)
[facility] Group Notes:  (Nursing/MHT/Case Management/Adjunct)  Date:  09/20/2016  Time:  6:29 AM  Type of Therapy:  Psychoeducational Skills  Participation Level:  Did Not Attend  Participation Quality Summary of Progress/Problems:  Mayra NeerJackie L Audrinna Sherman 09/20/2016, 6:29 AM

## 2016-09-20 NOTE — Progress Notes (Signed)
[facility] MD Progress Note  09/20/2016 10:40 AM Isaiah Garza  MRN:  [id number]  Subjective:  "I'm feeling great, I'm starting to plan [place]."  12/28 Follow-up note for this 26 year old man with bipolar disorder. Patient was seen today and also met with the treatment team. He seems to be making real progress. Although he is still somewhat euphoric and a little hyperactive he is much less bizarre in his thinking. He was actually able to hold an appropriate conversation and acknowledge that he has bipolar disorder. He did not have any specific new complaints. He is sleeping better. Denies any suicidal or homicidal ideation.  12/29 Isaiah Garza complains of feeling and energetic today. He keeps saying that he feels like he is dying but denies wanting to hurt himself. He has been taking his medications but is very adamant that he is not going to continue lithium at home. He also complains of pain all over and believes that lithium is exacerbating his pain. He's been out in the community interacting with peers and staff appropriately.  09/18/16: The patient reports that he is feeling somewhat lethargic and not very energetic today. He is no longer euphoric and thought processes are more organized. He denies any current active or passive suicidal thoughts but is expressing some depressive symptoms. He also admits to high levels of anxiety. He says groups make him anxious as he finds it hard to focus. He however denies any racing thoughts interfering with focus and concentration. He does admit to social anxiety and has a difficult time interacting with others. He denies any auditory or visual hallucinations. He does still have some suspicions about the medications including lithium and does not want to take the lithium after he leaves the hospital. He is however been compliant with all the medications including lithium and denies any physical adverse side effects other than the lethargy. Vital signs have been stable. He  is sleeping fairly well and denies any problems with his appetite. He does admit to marijuana use outside of the hospital but minimizes the use and says it only occurs every few months. He is not interested in any substance abuse treatment. His mom has been visiting him on the unit regularly.  12/31 Isaiah Garza feels sedated today. He is still asking to discontinue Lithium. He wants to keel Trileptal and Invega. His second injection of Invega will be on 09/22/2015.  He is asking to start Luvox for OCD. There are no somatic complaints. He is mostly in bed this morning.  1/1 patient reports feeling sluggish this morning but not as bad as it was yesterday. Patient says he is tolerating medications better. He reports his sleeping and eating well. Other than sedation he denies any other side effects. He denies hallucinations, suicidality, homicidality or having major problems with mood, appetite, energy is sleep or concentration. Patient denies any physical complaints Due to sedation lithium has been discontinued. He is now on Trileptal and Invega oral and injectable. He is a schedule for Invega injectable tomorrow. The staff reports he is not as impulsive and disorganized as he was a few days ago. He has been calm, pleasant and cooperative with staff.  Per nursing: Pt denies SI/HI/AVH. Mostly isolated to room this shift and was in bed. Pt was awoken for evening medication pass. Refused Restoril and stated "hopefully, I will be able to get out of bed in the a.m.". Medication compliant. Appropriate during interaction. Denies pain and voices no additional concerns at this time. Safety maintained.  Will continue to monitor  Principal Problem: Bipolar affective disorder, manic, severe, with psychotic behavior ([place]) Diagnosis:   Patient Active Problem List   Diagnosis Date Noted  . Bipolar affective disorder, manic, severe, with psychotic behavior ([place]) [F31.2] 09/14/2016  . Cannabis use disorder, moderate, dependence  ([place]) [F12.20] 09/14/2016  . Noncompliance [Z91.19] 09/14/2016  . Bipolar affective disorder, manic, severe ([place]) [F31.13] 09/13/2016   Total Time spent with patient: 20 minutes  Past Psychiatric History: Patient has a past history of mood disorder psychotic symptoms also a past history of noncompliance and substance abuse. Referred to the hospital because of manic psychotic behavior  Past Medical History:  Past Medical History:  Diagnosis Date  . ADD (attention deficit disorder)   . Allergy   . Asthma   . Bipolar 1 disorder ([place])    History reviewed. No pertinent surgical history. Family History:  Family History  Problem Relation Age of Onset  . Cancer Father     testicular  . Diabetes Neg Hx   . Heart disease Neg Hx   . Early death Neg Hx   . Stroke Neg Hx    Family Psychiatric  History: No known family history  Social History:  History  Alcohol Use  . 1.2 oz/week  . 1 Cans of beer, 1 Shots of liquor per week    Comment: social - only about 3 drinks     History  Drug Use No    Social History   Social History  . Marital status: Single    Spouse name: N/A  . Number of children: N/A  . Years of education: N/A   Occupational History  . cook    Social History Main Topics  . Smoking status: Former Research scientist (life sciences)  . Smokeless tobacco: Never Used  . Alcohol use 1.2 oz/week    1 Cans of beer, 1 Shots of liquor per week     Comment: social - only about 3 drinks  . Drug use: No  . Sexual activity: Yes   Other Topics Concern  . None   Social History Narrative   Exercise - assorted (walking, running, cycling, and cardio)     Sleep: Good  Appetite:  Good  Current Medications: Current Facility-Administered Medications  Medication Dose Route Frequency Provider Last Rate Last Dose  . acetaminophen (TYLENOL) tablet 650 mg  650 mg Oral Q4H PRN [name], NP   650 mg at 09/20/16 0811  . alum & mag hydroxide-simeth (MAALOX/MYLANTA) 200-200-20 MG/5ML suspension 30 mL   30 mL Oral PRN [name], NP      . carbamide peroxide (DEBROX) 6.5 % otic solution 5 drop  5 drop Left Ear BID Hildred Priest, MD   5 drop at 09/20/16 0807  . diclofenac sodium (VOLTAREN) 1 % transdermal gel 4 g  4 g Topical QID Clovis Fredrickson, MD   4 g at 09/20/16 0808  . fluvoxaMINE (LUVOX) tablet 100 mg  100 mg Oral QHS Jolanta B Pucilowska, MD      . hydrOXYzine (ATARAX/VISTARIL) tablet 50 mg  50 mg Oral TID PRN Clovis Fredrickson, MD   50 mg at 09/19/16 1522  . ibuprofen (ADVIL,MOTRIN) tablet 600 mg  600 mg Oral Q8H PRN [name], NP   600 mg at 09/19/16 1414  . magnesium hydroxide (MILK OF MAGNESIA) suspension 30 mL  30 mL Oral Daily PRN [name], NP      . neomycin-polymyxin-hydrocortisone (CORTISPORIN) otic suspension 1 drop  1 drop Left Ear BID Hildred Priest, MD   1 drop at 09/20/16 0809  . ondansetron (ZOFRAN) tablet 4 mg  4 mg Oral Q8H PRN [name], NP      . Oxcarbazepine (TRILEPTAL) tablet 300 mg  300 mg Oral BID Clovis Fredrickson, MD   300 mg at 09/20/16 0810  . [START ON 09/21/2016] paliperidone (INVEGA SUSTENNA) injection 156 mg  156 mg Intramuscular Once Jolanta B Pucilowska, MD      . paliperidone (INVEGA SUSTENNA) injection 234 mg  234 mg Intramuscular Q28 days Clovis Fredrickson, MD   234 mg at 09/17/16 1609  . paliperidone (INVEGA) 24 hr tablet 6 mg  6 mg Oral Daily [name], MD   6 mg at 09/20/16 0810  . temazepam (RESTORIL) capsule 15 mg  15 mg Oral QHS Jolanta B Pucilowska, MD   15 mg at 09/20/16 0023    Lab Results:  No results found for this or any previous visit (from the past 48 hour(s)).  Blood Alcohol level:  Lab Results  Component Value Date   ETH <5 09/12/2016   ETH 246 (H) 89/21/1942    Metabolic Disorder Labs: Lab Results  Component Value Date   HGBA1C 5.3 09/18/2016   MPG 105 09/18/2016   No results found for: PROLACTIN Lab Results  Component Value Date   CHOL 109 09/18/2016   TRIG  95 09/18/2016   HDL 43 09/18/2016   CHOLHDL 2.5 09/18/2016   VLDL 19 09/18/2016   LDLCALC 47 09/18/2016   LDLCALC 79 01/16/2015    Physical Findings: AIMS:  , ,  ,  ,    CIWA:    COWS:     Musculoskeletal: Strength & Muscle Tone: within normal limits Gait & Station: normal Patient leans: N/A  Psychiatric Specialty Exam: Physical Exam  Nursing note and vitals reviewed. Constitutional: He is oriented to person, place, and time. He appears well-developed and well-nourished.  HENT:  Head: Normocephalic and atraumatic.  Eyes: Conjunctivae and EOM are normal. Pupils are equal, round, and reactive to light.  Neck: Normal range of motion.  Cardiovascular: Regular rhythm and normal heart sounds.   Respiratory: Effort normal.  GI: Soft.  Musculoskeletal: Normal range of motion.  Neurological: He is alert and oriented to person, place, and time.  Skin: Skin is warm and dry.  Psychiatric: His speech is normal and behavior is normal. Thought content is not paranoid and not delusional. He expresses no homicidal and no suicidal ideation.    Review of Systems  Constitutional: Negative.  Negative for chills, fever and weight loss.  HENT: Negative.  Negative for congestion, ear pain, hearing loss, sore throat and tinnitus.   Eyes: Negative.  Negative for double vision, discharge and redness.  Respiratory: Negative.  Negative for cough, sputum production, shortness of breath and wheezing.   Cardiovascular: Negative.   Gastrointestinal: Negative.   Genitourinary: Negative.  Negative for dysuria.  Musculoskeletal: Negative.  Negative for back pain, falls, joint pain, myalgias and neck pain.  Skin: Negative.  Negative for itching and rash.  Neurological: Negative.  Negative for dizziness, tingling, tremors and headaches.  Endo/Heme/Allergies: Negative.   Psychiatric/Behavioral: Negative.     Blood pressure 129/73, pulse 62, temperature 97.9 F (36.6 C), temperature source Oral, resp. rate  18, height 6' 3"  (1.905 m), weight 76.2 kg (168 lb), SpO2 100 %.Body mass index is 21 kg/m.  General Appearance: Disheveled  Eye Contact:  Fair  Speech:  Normal Rate  Volume:  Normal  Mood:  Depressed  Affect:  Tired and lethargic  Thought Process:  Coherent  Orientation:  Full (Time, Place, and Person)  Thought Content:  Tangential  Suicidal Thoughts:  No  Homicidal Thoughts:  No  Memory:  Immediate;   Good Recent;   Fair Remote;   Fair  Judgement:  Fair  Insight:  Fair  Psychomotor Activity:  Normal  Concentration:  Concentration: Poor  Recall:  [place] of Knowledge:  Good  Language:  Good  Akathisia:  No  Handed:  Right  AIMS (if indicated):     Assets:  Communication Skills Housing Physical Health Social Support  ADL's:  Intact  Cognition:  Impaired,  Mild  Sleep:  Number of Hours: 7     Treatment Plan Summary:  Isaiah Garza is a 26 year old male with a history of bipolar disorder admitted for psychotic break.  1. Bipolar disorder. He is treated with lithium and Invega for mood stabilization. Li level on 12/30 was 1.2. Cr was slightly elevated but is normal now. He wants to discontinue lithium and switch to Trileptal. Lithium was d/c on 12/31. He agreed to take [place] injection on 12/29. Next injection on 1/2. The patient also has hydroxyzine 50 mg by mouth 3 times a day as needed for anxiety.   2. Cannabis Use Disorder: The patient was advised to abstain from marijuana and all illicit drugs as they may worsen psychosis.He minimizes marijuana use and is not interested in any substance abuse treatment  3. Insomnia: Continue Restoril 15 mg by mouth daily at bedtime. Earlier in his hospitalization he was tried on Ambien which was ineffective  4. Metabolic syndrome monitoring. Total cholesterol was 109, TSH normal, HgA1c and PRL are pending.  5. Chronic pain. He complains of back and neck pain from scoliosis. He has Voltaren gel.   6. Disposition. He will be  discharged to home. He will follow up for outpatient psychotropic medication management  Patient appears to be much improved. He is a scheduled to receive second injection of [place] tomorrow. We will need to set up a follow-up with psychiatry in [place] prior to discharge.  Potential discharge in the next 24-48 hours.  Hildred Priest, MD 09/20/2016, 10:40 AM

## 2016-09-20 NOTE — [facility] Group Notes (Signed)
[facility] LCSW Group Therapy   09/20/2016  9:30 AM  Type of Therapy: Group Therapy   Participation Level: Did Not Attend. Patient invited to participate but declined.    Dorene Bruni F. Rashad Obeid, MSW, LCSWA, LCAS   \  

## 2016-09-21 MED ORDER — PALIPERIDONE ER 3 MG PO TB24: 6.0000 mg | ORAL_TABLET | Freq: Every day | ORAL | Status: DC

## 2016-09-21 MED ORDER — OXCARBAZEPINE 300 MG PO TABS: 300.0000 mg | ORAL_TABLET | Freq: Two times a day (BID) | ORAL | 0 refills | Status: DC

## 2016-09-21 MED ORDER — PALIPERIDONE ER 6 MG PO TB24: 6.0000 mg | ORAL_TABLET | Freq: Every day | ORAL | 0 refills | Status: DC

## 2016-09-21 MED ORDER — TEMAZEPAM 15 MG PO CAPS: 15.0000 mg | ORAL_CAPSULE | Freq: Every evening | ORAL | 0 refills | Status: DC | PRN

## 2016-09-21 MED ORDER — TEMAZEPAM 15 MG PO CAPS
15.0000 mg | ORAL_CAPSULE | Freq: Every evening | ORAL | Status: DC | PRN
  Administered 2016-09-21: 15 mg via ORAL
  Filled 2016-09-21: qty 1

## 2016-09-21 MED ORDER — FLUVOXAMINE MALEATE 100 MG PO TABS: 100.0000 mg | ORAL_TABLET | Freq: Every day | ORAL | 0 refills | Status: DC

## 2016-09-21 MED ORDER — PALIPERIDONE PALMITATE 156 MG/ML IM SUSP: 156.0000 mg | Freq: Once | INTRAMUSCULAR | 0 refills | Status: DC

## 2016-09-21 NOTE — [facility] Group Notes (Signed)
[facility] LCSW Group Therapy   09/21/2016 1pm   Type of Therapy: Group Therapy   Participation Level: Active   Participation Quality: Attentive, Sharing and Supportive   Affect: Appropriate  Cognitive: Alert and Oriented   Insight: Developing/Improving and Engaged   Engagement in Therapy: Developing/Improving and Engaged   Modes of Intervention: Clarification, Confrontation, Discussion, Education, Exploration,  Limit-setting, Orientation, Problem-solving, Rapport Building, Dance movement psychotherapisteality Testing, Socialization and Support  Summary of Progress/Problems: The topic for group therapy was feelings about diagnosis. Pt actively participated in group discussion on their past and current diagnosis and how they feel towards this. Pt also identified how society and family members judge them, based on their diagnosis as well as stereotypes and stigmas. Pt reported the pt has been diagnosed as having Bi-polar disorder but tries to deal effectively with the pt's diagnosis by staying positive and "working on my goals".  Pt seems to have made great improvement during group, as evidenced by increased sharing, sharing at length when prompted and pt presents as happy and increasingly energetic, as compared to previous sessions. Pt has demonstrated a tendency to dominate group sharing but can be easily redirectable.      Dorothe PeaJonathan F. Rhia Blatchford, LCSWA, LCAS

## 2016-09-21 NOTE — Plan of Care (Signed)
Problem: Activity: Goal: Sleeping patterns will improve Outcome: Progressing Patient slept for Estimated Hours of 7.45; safety maintained, no injury or falls during this shift.   

## 2016-09-21 NOTE — Progress Notes (Signed)
Recreation Therapy Notes  Date: 01.02.18 Time: 3:00 pm Location: Craft Room  Group Topic: Self-expression  Goal Area(s) Addresses:  Patient will identify one color per emotion listed on wheel. Patient will verbalize benefit of using art as a means of self-expression. Patient will verbalize one emotion experienced during session. Patient will be educated on other forms of self-expression.  Behavioral Response: Did not attend  Intervention: Emotion Wheel  Activity: Patients were given an Arboriculturistmotion Wheel worksheet with 7 different emotions listed. Patients were instructed to pick a color for each emotion and color it in on the wheel.  Education: LRT educated patients on other forms of self-expression.  Education Outcome: Patient did not attend group.  Clinical Observations/Feedback: Patient did not attend group.  Jacquelynn CreeGreene,Delores Edelstein M, LRT/CTRS 09/21/2016 4:02 PM

## 2016-09-21 NOTE — Progress Notes (Signed)
  January 3rd, 2018   Patient:  Isaiah Garza is an 26 y.o., male MRN:  [id number] DOB:  01/20/1991 Patient phone:  [phone] (home)          Patient address:   [address] [place] [address],        To whom it may concern:  This patient was hospitalized at [facility] from 09/12/16 to 09/22/16. This patient has been medically cleared and will be able to return to school/work on 09/29/16.   If more information is needed about this case, please do not hesitate to contact me at [phone].  Sincerely,    Radene JourneyAndrea Hernandez MD [facility] [address], [place], [place] [address]

## 2016-09-21 NOTE — Plan of Care (Signed)
Problem: Skin Integrity: Goal: Risk for impaired skin integrity will decrease Outcome: Progressing Encourage proper hygiene

## 2016-09-21 NOTE — Progress Notes (Signed)
  [facility] :  Will you be returning to the same living situation after discharge:  Yes,    At discharge, do you have transportation home?: Yes,    Do you have the ability to pay for your medications: Yes,     Release of information consent forms completed and in the chart;  Patient's signature needed at discharge.  Patient to Follow up at: Follow-up Information    [facility] Follow up on 09/30/2016.   Why:  Initial appointment for therapy on 1/11 @ 9am w Beth.  Please call to cancel/reschedule if needed.    Contact information: [address] [place]  [address] Phone:  [phone] Fax:  On EPIC       [facility] Follow up on 10/22/2016.   Why:  Initial appointment for medications management w Dr Lolly MustacheArfeen on 2/2 at 11 AM.  This was the earliest appointment available.  Please call to cancel/reschedule if needed.   Contact information: [address] [place]  [address] Phone:  [phone] Fax:  On EPIC       BAITY, REGINA, NP. Go on 09/27/2016.   Specialty:  Internal Medicine Why:  Your appointment is at 3:45 pm. Please bring current list of medications and your insurance cards to this appointment Contact information: [address] [place] [address] [phone]           Next level of care provider has access to [facility] Link:yes  Safety Planning and Suicide Prevention discussed: Yes,     Have you used any form of tobacco in the last 30 days? (Cigarettes, Smokeless Tobacco, Cigars, and/or Pipes): No  Has patient been referred to the Quitline?: N/A patient is not a smoker  Patient has been referred for addiction treatment: Yes  Glennon MacSara P Charda Janis, MSW, LCSW 09/21/2016, 3:58 PM

## 2016-09-21 NOTE — [facility] Suicide Risk Assessment (Signed)
[facility]   Principal Problem: Bipolar affective disorder, manic, severe, with psychotic behavior [facility](HCC) Discharge Diagnoses:  Patient Active Problem List   Diagnosis Date Noted  . Bipolar affective disorder, manic, severe, with psychotic behavior (HCC) [F31.2] 09/14/2016  . Cannabis use disorder, moderate, dependence (HCC) [F12.20] 09/14/2016  . Noncompliance [Z91.19] 09/14/2016  . Bipolar affective disorder, manic, severe (HCC) [F31.13] 09/13/2016      Psychiatric Specialty Exam: ROS  Blood pressure 128/76, pulse 68, temperature 98.5 F (36.9 C), temperature source Oral, resp. rate 18, height 6\' 3"  (1.905 m), weight 76.2 kg (168 lb), SpO2 100 %.Body mass index is 21 kg/m.                                                       Mental Status Per Nursing Assessment::   On Admission:     Demographic Factors:  Male and Adolescent or young adult  Loss Factors: academic problems  Historical Factors: Family history of mental illness or substance abuse and Impulsivity  Risk Reduction Factors:   Sense of responsibility to family, Living with another person, especially a relative and Positive social support  Continued Clinical Symptoms:  Alcohol/Substance Abuse/Dependencies Previous Psychiatric Diagnoses and Treatments  Cognitive Features That Contribute To Risk:  None    Suicide Risk:  Minimal: No identifiable suicidal ideation.  Patients presenting with no risk factors but with morbid ruminations; may be classified as minimal risk based on the severity of the depressive symptoms  Follow-up Information    [facility] Follow up on 09/30/2016.   Why:  Initial appointment for therapy on 1/11 @ 9am w Beth.  Please call to cancel/reschedule if needed.    Contact information: [address] [place]  [address] Phone:  [phone] Fax:  On EPIC       [facility]  Follow up on 10/22/2016.   Why:  Initial appointment for medications management w Dr Lolly MustacheArfeen on 2/2 at 11 AM.  This was the earliest appointment available.  Please call to cancel/reschedule if needed.   Contact information: [address] [place]  [address] Phone:  [phone] Fax:  On EPIC       BAITY, REGINA, NP. Go on 09/27/2016.   Specialty:  Internal Medicine Why:  Your appointment is at 3:45 pm. Please bring current list of medications and your insurance cards to this appointment Contact information: [address] [place] [address] [phone]          Jimmy FootmanHernandez-Gonzalez,  Katherine Syme, MD 09/22/2016, 9:38 AM

## 2016-09-21 NOTE — [facility] Group Notes (Signed)
Goals Group Date/Time: 09/21/2016 9:00 AM Type of Therapy and Topic: Group Therapy: Goals Group: SMART Goals   Participation Level: Moderate  Description of Group:    The purpose of a daily goals group is to assist and guide patients in setting recovery/wellness-related goals. The objective is to set goals as they relate to the crisis in which they were admitted. Patients will be using SMART goal modalities to set measurable goals. Characteristics of realistic goals will be discussed and patients will be assisted in setting and processing how one will reach their goal. Facilitator will also assist patients in applying interventions and coping skills learned in psycho-education groups to the SMART goal and process how one will achieve defined goal.   Therapeutic Goals:   -Patients will develop and document one goal related to or their crisis in which brought them into treatment.  -Patients will be guided by LCSW using SMART goal setting modality in how to set a measurable, attainable, realistic and time sensitive goal.  -Patients will process barriers in reaching goal.  -Patients will process interventions in how to overcome and successful in reaching goal.   Patient's Goal: Pt sahred his goal was to help people.  Pt shared he intends to focus on his goal to be successful.  Pt was polite and cooperative with the CSW and other group members and focused and attentive to the topics discussed and the sharing of others.   Therapeutic Modalities:  Motivational Interviewing  Research officer, political partyCognitive Behavioral Therapy  Crisis Intervention Model  SMART goals setting   Dorothe PeaJonathan F. Bard Haupert, LCSWA, LCAS

## 2016-09-21 NOTE — [facility] Group Notes (Signed)
[facility] Group Notes:  (Nursing/MHT/Case Management/Adjunct)  Date:  09/21/2016  Time:  6:02 AM  Type of Therapy:  Psychoeducational Skills  Participation Level:  Active  Participation Quality:  Appropriate, Attentive, Sharing and Supportive  Affect:  Excited  Cognitive:  Appropriate  Insight:  Appropriate and Good  Engagement in Group:  Engaged  Modes of Intervention:  Discussion, Socialization and Support  Summary of Progress/Problems:  Isaiah MilroyLaquanda Y Neleh Garza 09/21/2016, 6:02 AM

## 2016-09-21 NOTE — Discharge Summary (Signed)
Physician Discharge Summary Note  Patient:  Isaiah Garza is an 26 y.o., male MRN:  [id number] DOB:  01/09/1991 Patient phone:  [phone] (home)  Patient address:   [place] [address],  Total Time spent with patient: 30 minutes  Date of Admission:  09/13/2016 Date of Discharge: 09/23/15  Reason for Admission:  mania  Principal Problem: Bipolar affective disorder, manic, severe, with psychotic behavior [facility]) Discharge Diagnoses: Patient Active Problem List   Diagnosis Date Noted  . Bipolar affective disorder, manic, severe, with psychotic behavior ([place]) [F31.2] 09/14/2016  . Cannabis use disorder, moderate, dependence ([place]) [F12.20] 09/14/2016  . Noncompliance [Z91.19] 09/14/2016  . Bipolar affective disorder, manic, severe ([place]) [F31.13] 09/13/2016    History of Present Illness: 26 year old man with a past history of a diagnosis of bipolar disorder brought to the emergency room by his parents. Patient is not a very clear historian because of his psychosis. Evidently his bizarre behavior and thinking patterns have been going on worse for at least several days possibly longer. Patient indicates to me that he has recently been noncompliant with his medicine probably for at least 4-5 days. He is currently presenting as bizarre and disorganized in his speech. No insight. He talks a lot about suicide but when pressed it is very clear that he does not actually want to harm himself or harm anyone else. No specific recent stressors known other than his being off his medicine although his drug screen is positive for cannabis Associated Signs/Symptoms: Depression Symptoms:  psychomotor agitation, (Hypo) Manic Symptoms:  Delusions, Elevated Mood, Flight of Ideas, Grandiosity, Impulsivity, Irritable Mood, Anxiety Symptoms:  Excessive Worry, Psychotic Symptoms:  Delusions, Hallucinations: Auditory Paranoia, PTSD Symptoms: Negative Total Time spent with patient: 1  hour  Past Psychiatric History: Patient apparently has had 2 prior hospitalizations although I don't find records of them. He can name multiple antipsychotics that he has been taking. Says that he was most recently taking lithium and Zyprexa. Patient refers to all medicines as being "poison". Denies ever having tried to kill himself or being violent in the past. Prior to that he had had an earlier diagnosis of ADHD but it sounds like even at that time he was showing signs of psychosis.  Past Medical History:  Past Medical History:  Diagnosis Date  . ADD (attention deficit disorder)   . Allergy   . Asthma   . Bipolar 1 disorder ([place])    History reviewed. No pertinent surgical history.  Family History:  Family History  Problem Relation Age of Onset  . Cancer Father     testicular  . Diabetes Neg Hx   . Heart disease Neg Hx   . Early death Neg Hx   . Stroke Neg Hx    Family Psychiatric  History: patient says several members of his family including his grandmother and aunt had bipolar disorder and were psychotic  Social History:  History  Alcohol Use  . 1.2 oz/week  . 1 Cans of beer, 1 Shots of liquor per week    Comment: social - only about 3 drinks     History  Drug Use No    Social History   Social History  . Marital status: Single    Spouse name: N/A  . Number of children: N/A  . Years of education: N/A   Occupational History  . cook    Social History Main Topics  . Smoking status: Former Research scientist (life sciences)  . Smokeless tobacco: Never Used  .  Alcohol use 1.2 oz/week    1 Cans of beer, 1 Shots of liquor per week     Comment: social - only about 3 drinks  . Drug use: No  . Sexual activity: Yes   Other Topics Concern  . None   Social History Narrative   Exercise - assorted (walking, running, cycling, and cardio)    Hospital Course:    Isaiah Garza is a 26 year old male with a history of bipolar disorder admitted for psychotic break.  Proceeding with caution (reappearance  of mania) as lithium was just discontinued 2 days ago.  1. Bipolar disorder. He is treated with lithium and Invega for mood stabilization. Li level on 12/30 was 1.2. Cr was slightly elevated but is normal now. He wants to discontinue lithium and switch to Trileptal. Lithium was d/c on 12/31. He agreed to take [place] injection on 12/29. Next injection of invega 156 mg due today  2. Cannabis Use Disorder: The patient was advised to abstain from marijuana and all illicit drugs as they may worsen psychosis.He minimizes marijuana use and is not interested in any substance abuse treatment  3. Insomnia: Continue Restoril 15 mg by mouth daily at bedtime. Earlier in his hospitalization he was tried on Ambien which was ineffective  4. Metabolic syndrome monitoring: HbA1c and lipid panel have been completed  5. Chronic pain. He complains of back and neck pain from scoliosis. He has Voltaren gel.   6. Disposition. He will be discharged to home. He will follow up for outpatient psychotropic medication management  Patient appears to be much improved. He received second injection of [place] on 09/22/15. Patient reports significant improvement in mood, sleep and energy. Feels that his thinking concentration have improved. He is tolerating medications well without major side effects. He denies any physical complaints. He denies suicidality, homicidality or hopelessness. He denies any auditory or visual hallucinations. He was calm, pleasant and cooperative. He had not display any disruptive or inappropriate behavior. He has been participating in programming appropriately. He has been compliant with medications. He has not require seclusion, restraints or forced medications.  Patient has denied having any access to guns  He appears future oriented. Preoccupied with returning back to school. Later for his school has been written for the patient.  Staff working with the patient reports he has  been very pleasant and cooperative. They do not have any concerns about his safety or the safety of others upon his discharge.  Physical Findings: AIMS:  , ,  ,  ,    CIWA:    COWS:     Musculoskeletal: Strength & Muscle Tone: within normal limits Gait & Station: normal Patient leans: N/A  Psychiatric Specialty Exam: Physical Exam  Constitutional: He is oriented to person, place, and time. He appears well-developed and well-nourished.  HENT:  Head: Normocephalic and atraumatic.  Eyes: EOM are normal.  Neck: Normal range of motion.  Respiratory: Effort normal.  Musculoskeletal: Normal range of motion.  Neurological: He is alert and oriented to person, place, and time.    Review of Systems  Constitutional: Negative.   HENT: Negative.   Eyes: Negative.   Respiratory: Negative.   Cardiovascular: Negative.   Gastrointestinal: Negative.   Genitourinary: Negative.   Musculoskeletal: Negative.   Skin: Negative.   Neurological: Negative.   Endo/Heme/Allergies: Negative.   Psychiatric/Behavioral: Positive for substance abuse. Negative for depression, hallucinations, memory loss and suicidal ideas. The patient is not nervous/anxious and does not have insomnia.  Blood pressure 128/76, pulse 68, temperature 98.5 F (36.9 C), temperature source Oral, resp. rate 18, height 6' 3"  (1.905 m), weight 76.2 kg (168 lb), SpO2 100 %.Body mass index is 21 kg/m.  General Appearance: Fairly Groomed  Eye Contact:  Good  Speech:  Clear and Coherent  Volume:  Normal  Mood:  Euthymic  Affect:  Appropriate  Thought Process:  Linear and Descriptions of Associations: Intact  Orientation:  Full (Time, Place, and Person)  Thought Content:  Hallucinations: None  Suicidal Thoughts:  No  Homicidal Thoughts:  No  Memory:  Immediate;   Good Recent;   Good Remote;   Good  Judgement:  Fair  Insight:  Fair  Psychomotor Activity:  Normal  Concentration:  Concentration: Good and Attention Span: Good   Recall:  Good  Fund of Knowledge:  Good  Language:  Good  Akathisia:  No  Handed:    AIMS (if indicated):     Assets:  Communication Skills Social Support  ADL's:  Intact  Cognition:  WNL  Sleep:  Number of Hours: 8     Have you used any form of tobacco in the last 30 days? (Cigarettes, Smokeless Tobacco, Cigars, and/or Pipes): No  Has this patient used any form of tobacco in the last 30 days? (Cigarettes, Smokeless Tobacco, Cigars, and/or Pipes) Yes, No  Blood Alcohol level:  Lab Results  Component Value Date   ETH <5 09/12/2016   ETH 246 (H) 24/58/0998    Metabolic Disorder Labs:  Lab Results  Component Value Date   HGBA1C 5.3 09/18/2016   MPG 105 09/18/2016   No results found for: PROLACTIN Lab Results  Component Value Date   CHOL 109 09/18/2016   TRIG 95 09/18/2016   HDL 43 09/18/2016   CHOLHDL 2.5 09/18/2016   VLDL 19 09/18/2016   LDLCALC 47 09/18/2016   LDLCALC 79 01/16/2015   Results for EMAAD, NANNA (MRN [id number]) as of 09/21/2016 12:56  Ref. Range 09/12/2016 23:01 09/12/2016 23:13 09/17/2016 17:50 09/18/2016 08:17  Sodium Latest Ref Range: 135 - 145 mmol/L  136  138  Potassium Latest Ref Range: 3.5 - 5.1 mmol/L  3.5  4.1  Chloride Latest Ref Range: 101 - 111 mmol/L  99 (L)  103  CO2 Latest Ref Range: 22 - 32 mmol/L  22  30  Mean Plasma Glucose Latest Units: mg/dL    105  BUN Latest Ref Range: 6 - 20 mg/dL  26 (H)  9  Creatinine Latest Ref Range: 0.61 - 1.24 mg/dL  1.47 (H)  1.00  Calcium Latest Ref Range: 8.9 - 10.3 mg/dL  10.6 (H)  9.5  EGFR (Non-African Amer.) Latest Ref Range: >60 mL/min  >60  >60  EGFR (African American) Latest Ref Range: >60 mL/min  >60  >60  Glucose Latest Ref Range: 65 - 99 mg/dL  148 (H)  136 (H)  Anion gap Latest Ref Range: 5 - 15   15  5   Alkaline Phosphatase Latest Ref Range: 38 - 126 U/L  75    Albumin Latest Ref Range: 3.5 - 5.0 g/dL  6.1 (H)    AST Latest Ref Range: 15 - 41 U/L  25    ALT Latest Ref Range: 17 - 63  U/L  29    Total Protein Latest Ref Range: 6.5 - 8.1 g/dL  9.6 (H)    Total Bilirubin Latest Ref Range: 0.3 - 1.2 mg/dL  0.9    Cholesterol Latest Ref Range: 0 - 200  mg/dL    109  Triglycerides Latest Ref Range: <150 mg/dL    95  HDL Cholesterol Latest Ref Range: >40 mg/dL    43  LDL (calc) Latest Ref Range: 0 - 99 mg/dL    47  VLDL Latest Ref Range: 0 - 40 mg/dL    19  Total CHOL/HDL Ratio Latest Units: RATIO    2.5  WBC Latest Ref Range: 4.0 - 10.5 K/uL  10.7 (H)    RBC Latest Ref Range: 4.22 - 5.81 MIL/uL  6.03 (H)    Hemoglobin Latest Ref Range: 13.0 - 17.0 g/dL  18.6 (H)    HCT Latest Ref Range: 39.0 - 52.0 %  51.2    MCV Latest Ref Range: 78.0 - 100.0 fL  84.9    MCH Latest Ref Range: 26.0 - 34.0 pg  30.8    MCHC Latest Ref Range: 30.0 - 36.0 g/dL  36.3 (H)    RDW Latest Ref Range: 11.5 - 15.5 %  13.4    Platelets Latest Ref Range: 150 - 400 K/uL  344    Acetaminophen (Tylenol), S Latest Ref Range: 10 - 30 ug/mL  <10 (L)    Lithium Latest Ref Range: 0.60 - 1.20 mmol/L    0.25  Salicylate Lvl Latest Ref Range: 2.8 - 30.0 mg/dL  <7.0    Hemoglobin A1C Latest Ref Range: 4.8 - 5.6 %    5.3  TSH Latest Ref Range: 0.350 - 4.500 uIU/mL    1.448  Alcohol, Ethyl (B) Latest Ref Range: <5 mg/dL  <5    Amphetamines Latest Ref Range: NONE DETECTED  NONE DETECTED     Barbiturates Latest Ref Range: NONE DETECTED  NONE DETECTED     Benzodiazepines Latest Ref Range: NONE DETECTED  NONE DETECTED     Opiates Latest Ref Range: NONE DETECTED  NONE DETECTED     COCAINE Latest Ref Range: NONE DETECTED  NONE DETECTED     Tetrahydrocannabinol Latest Ref Range: NONE DETECTED  POSITIVE (A)     EKG 12-LEAD Unknown   Rpt    See Psychiatric Specialty Exam and Suicide Risk Assessment completed by Attending Physician prior to discharge.  Discharge destination:  Home  Is patient on multiple antipsychotic therapies at discharge:  Yes,   Do you recommend tapering to monotherapy for antipsychotics?  Yes     Has Patient had three or more failed trials of antipsychotic monotherapy by history:  No  Recommended Plan for Multiple Antipsychotic Therapies: Taper to monotherapy as described:  gradually taper off oral invega  Allergies as of 09/22/2016      Reactions   Other    Pt states that all medicines are poison antibiotics, ssri's etc. Could not get a concrete answer on this       Medication List    STOP taking these medications   lithium 600 MG capsule   OLANZapine 15 MG tablet Commonly known as:  ZYPREXA   traMADol 50 MG tablet Commonly known as:  ULTRAM     TAKE these medications     Indication  fluvoxaMINE 100 MG tablet Commonly known as:  LUVOX Take 1 tablet (100 mg total) by mouth at bedtime.  Indication:  Depression   Oxcarbazepine 300 MG tablet Commonly known as:  TRILEPTAL Take 1 tablet (300 mg total) by mouth 2 (two) times daily.  Indication:  Manic-Depression   paliperidone 156 MG/ML Susp injection Commonly known as:  INVEGA SUSTENNA Inject 1 mL (156 mg total) into the  muscle once. Due January 26 Start taking on:  10/15/2016  Indication:  bipolar   paliperidone 6 MG 24 hr tablet Commonly known as:  INVEGA Take 1 tablet (6 mg total) by mouth at bedtime.  Indication:  bipolar   temazepam 15 MG capsule Commonly known as:  RESTORIL Take 1 capsule (15 mg total) by mouth at bedtime as needed for sleep.  Indication:  [facility] Follow up on 09/30/2016.   Why:  Initial appointment for therapy on 1/11 @ 9am w Beth.  Please call to cancel/reschedule if needed.    Contact information: [address] [place]  [address] Phone:  [phone] Fax:  [facility] Follow up on 10/22/2016.   Why:  Initial appointment for medications management w Dr Adele Schilder on 2/2 at 11 AM.  This was the earliest appointment available.  Please call to cancel/reschedule  if needed.   Contact information: [address] [place]  [address] Phone:  [phone] Fax:  On [facility]       BAITY, REGINA, NP. Go on 09/27/2016.   Specialty:  Internal Medicine Why:  Your appointment is at 3:45 pm. Please bring current list of medications and your insurance cards to this appointment Contact information: [address] [place] [address] [phone]          >30 minutes.  >50 % of the time was spent in coordination of care  Signed: Hildred Priest, MD 09/22/2016, 9:41 AM

## 2016-09-21 NOTE — Progress Notes (Signed)
[facility] MD Progress Note  09/21/2016 11:36 AM Aws Shere Feuerstein  MRN:  [id number]  Subjective:  "I'm feeling great, I'm starting to plan [place]."  12/28 Follow-up note for this 26 year old man with bipolar disorder. Patient was seen today and also met with the treatment team. He seems to be making real progress. Although he is still somewhat euphoric and a little hyperactive he is much less bizarre in his thinking. He was actually able to hold an appropriate conversation and acknowledge that he has bipolar disorder. He did not have any specific new complaints. He is sleeping better. Denies any suicidal or homicidal ideation.  12/29 Mr. Shirer complains of feeling and energetic today. He keeps saying that he feels like he is dying but denies wanting to hurt himself. He has been taking his medications but is very adamant that he is not going to continue lithium at home. He also complains of pain all over and believes that lithium is exacerbating his pain. He's been out in the community interacting with peers and staff appropriately.  09/18/16: The patient reports that he is feeling somewhat lethargic and not very energetic today. He is no longer euphoric and thought processes are more organized. He denies any current active or passive suicidal thoughts but is expressing some depressive symptoms. He also admits to high levels of anxiety. He says groups make him anxious as he finds it hard to focus. He however denies any racing thoughts interfering with focus and concentration. He does admit to social anxiety and has a difficult time interacting with others. He denies any auditory or visual hallucinations. He does still have some suspicions about the medications including lithium and does not want to take the lithium after he leaves the hospital. He is however been compliant with all the medications including lithium and denies any physical adverse side effects other than the lethargy. Vital signs have been stable. He  is sleeping fairly well and denies any problems with his appetite. He does admit to marijuana use outside of the hospital but minimizes the use and says it only occurs every few months. He is not interested in any substance abuse treatment. His mom has been visiting him on the unit regularly.  12/31 Mr. Sheaffer feels sedated today. He is still asking to discontinue Lithium. He wants to keel Trileptal and Invega. His second injection of Invega will be on 09/22/2015.  He is asking to start Luvox for OCD. There are no somatic complaints. He is mostly in bed this morning.  1/1 patient reports feeling sluggish this morning but not as bad as it was yesterday. Patient says he is tolerating medications better. He reports his sleeping and eating well. Other than sedation he denies any other side effects. He denies hallucinations, suicidality, homicidality or having major problems with mood, appetite, energy is sleep or concentration. Patient denies any physical complaints Due to sedation lithium has been discontinued. He is now on Trileptal and Invega oral and injectable. He is a schedule for Invega injectable tomorrow. The staff reports he is not as impulsive and disorganized as he was a few days ago. He has been calm, pleasant and cooperative with staff.  1/2 patient reports that he is still feels sedated and groggy. Says he has been is sleeping well and has not been needing to take the Restoril. He feels that his thoughts are not racing however he complains of having great difficulty with attention and focus. He has been attending groups. He has been compliant with  medications. Has not been disruptive, agitated or aggressive. He is willing to receive Invega second injection today. Other than sedation he denies any side effects from medications. He denies any physical complaints. He denies major problems with sleep or appetite.    Per nursing: "Cristie Hem" is much better, thoughts are more organized, able to demonstrate  return of teaching in his own way regarding the Invega: the loading dose and maintenance once a month or every 28 days; he is happy with this plan, "If I can stay on this medication, I will be okay ..." Appearance: wearing jeans, a shirt and a black leather jacket, no crying spells, visible in the milieu, verbalized that he is feeling much better. Denied SI/SIB/HI, denied AV/H.  Principal Problem: Bipolar affective disorder, manic, severe, with psychotic behavior ([place]) Diagnosis:   Patient Active Problem List   Diagnosis Date Noted  . Bipolar affective disorder, manic, severe, with psychotic behavior ([place]) [F31.2] 09/14/2016  . Cannabis use disorder, moderate, dependence ([place]) [F12.20] 09/14/2016  . Noncompliance [Z91.19] 09/14/2016  . Bipolar affective disorder, manic, severe ([place]) [F31.13] 09/13/2016   Total Time spent with patient: 30 minutes  Past Psychiatric History: Patient has a past history of mood disorder psychotic symptoms also a past history of noncompliance and substance abuse. Referred to the hospital because of manic psychotic behavior  Past Medical History:  Past Medical History:  Diagnosis Date  . ADD (attention deficit disorder)   . Allergy   . Asthma   . Bipolar 1 disorder ([place])    History reviewed. No pertinent surgical history. Family History:  Family History  Problem Relation Age of Onset  . Cancer Father     testicular  . Diabetes Neg Hx   . Heart disease Neg Hx   . Early death Neg Hx   . Stroke Neg Hx    Family Psychiatric  History: No known family history  Social History:  History  Alcohol Use  . 1.2 oz/week  . 1 Cans of beer, 1 Shots of liquor per week    Comment: social - only about 3 drinks     History  Drug Use No    Social History   Social History  . Marital status: Single    Spouse name: N/A  . Number of children: N/A  . Years of education: N/A   Occupational History  . cook    Social History Main Topics  . Smoking status: Former  Research scientist (life sciences)  . Smokeless tobacco: Never Used  . Alcohol use 1.2 oz/week    1 Cans of beer, 1 Shots of liquor per week     Comment: social - only about 3 drinks  . Drug use: No  . Sexual activity: Yes   Other Topics Concern  . None   Social History Narrative   Exercise - assorted (walking, running, cycling, and cardio)     Sleep: Good  Appetite:  Good  Current Medications: Current Facility-Administered Medications  Medication Dose Route Frequency Provider Last Rate Last Dose  . acetaminophen (TYLENOL) tablet 650 mg  650 mg Oral Q4H PRN [name], NP   650 mg at 09/20/16 0811  . alum & mag hydroxide-simeth (MAALOX/MYLANTA) 200-200-20 MG/5ML suspension 30 mL  30 mL Oral PRN [name], NP      . carbamide peroxide (DEBROX) 6.5 % otic solution 5 drop  5 drop Left Ear BID Hildred Priest, MD   5 drop at 09/21/16 0814  . diclofenac sodium (VOLTAREN) 1 % transdermal  gel 4 g  4 g Topical QID Clovis Fredrickson, MD   4 g at 09/21/16 0813  . fluvoxaMINE (LUVOX) tablet 100 mg  100 mg Oral QHS Clovis Fredrickson, MD   100 mg at 09/20/16 2112  . ibuprofen (ADVIL,MOTRIN) tablet 600 mg  600 mg Oral Q8H PRN [name], NP   600 mg at 09/19/16 1414  . neomycin-polymyxin-hydrocortisone (CORTISPORIN) otic suspension 1 drop  1 drop Left Ear BID Hildred Priest, MD   1 drop at 09/21/16 0816  . Oxcarbazepine (TRILEPTAL) tablet 300 mg  300 mg Oral BID Clovis Fredrickson, MD   300 mg at 09/21/16 0816  . paliperidone (INVEGA SUSTENNA) injection 156 mg  156 mg Intramuscular Once Jolanta B Pucilowska, MD      . paliperidone (INVEGA SUSTENNA) injection 234 mg  234 mg Intramuscular Q28 days Clovis Fredrickson, MD   234 mg at 09/17/16 1609  . [START ON 09/22/2016] paliperidone (INVEGA) 24 hr tablet 6 mg  6 mg Oral QHS Hildred Priest, MD      . temazepam (RESTORIL) capsule 15 mg  15 mg Oral QHS PRN Hildred Priest, MD        Lab Results:  No results found  for this or any previous visit (from the past 48 hour(s)).  Blood Alcohol level:  Lab Results  Component Value Date   ETH <5 09/12/2016   ETH 246 (H) 60/73/7106    Metabolic Disorder Labs: Lab Results  Component Value Date   HGBA1C 5.3 09/18/2016   MPG 105 09/18/2016   No results found for: PROLACTIN Lab Results  Component Value Date   CHOL 109 09/18/2016   TRIG 95 09/18/2016   HDL 43 09/18/2016   CHOLHDL 2.5 09/18/2016   VLDL 19 09/18/2016   LDLCALC 47 09/18/2016   LDLCALC 79 01/16/2015    Physical Findings: AIMS:  , ,  ,  ,    CIWA:    COWS:     Musculoskeletal: Strength & Muscle Tone: within normal limits Gait & Station: normal Patient leans: N/A  Psychiatric Specialty Exam: Physical Exam  Nursing note and vitals reviewed. Constitutional: He is oriented to person, place, and time. He appears well-developed and well-nourished.  HENT:  Head: Normocephalic and atraumatic.  Eyes: Conjunctivae and EOM are normal. Pupils are equal, round, and reactive to light.  Neck: Normal range of motion.  Cardiovascular: Regular rhythm and normal heart sounds.   Respiratory: Effort normal.  GI: Soft.  Musculoskeletal: Normal range of motion.  Neurological: He is alert and oriented to person, place, and time.  Skin: Skin is warm and dry.  Psychiatric: His speech is normal and behavior is normal. Thought content is not paranoid and not delusional. He expresses no homicidal and no suicidal ideation.    Review of Systems  Constitutional: Negative.  Negative for chills, fever and weight loss.  HENT: Negative.  Negative for congestion, ear pain, hearing loss, sore throat and tinnitus.   Eyes: Negative.  Negative for double vision, discharge and redness.  Respiratory: Negative.  Negative for cough, sputum production, shortness of breath and wheezing.   Cardiovascular: Negative.   Gastrointestinal: Negative.   Genitourinary: Negative.  Negative for dysuria.  Musculoskeletal:  Negative.  Negative for back pain, falls, joint pain, myalgias and neck pain.  Skin: Negative.  Negative for itching and rash.  Neurological: Negative.  Negative for dizziness, tingling, tremors and headaches.  Endo/Heme/Allergies: Negative.   Psychiatric/Behavioral: Negative.  Negative for depression, hallucinations, memory  loss, substance abuse and suicidal ideas. The patient is not nervous/anxious and does not have insomnia.     Blood pressure 123/72, pulse 66, temperature 98.1 F (36.7 C), temperature source Oral, resp. rate 18, height 6' 3" (1.905 m), weight 76.2 kg (168 lb), SpO2 100 %.Body mass index is 21 kg/m.  General Appearance: Disheveled  Eye Contact:  Fair  Speech:  Normal Rate  Volume:  Normal  Mood:  Depressed  Affect:  Tired and lethargic  Thought Process:  Coherent  Orientation:  Full (Time, Place, and Person)  Thought Content:  Logical and Hallucinations: None  Suicidal Thoughts:  No  Homicidal Thoughts:  No  Memory:  Immediate;   Good Recent;   Fair Remote;   Fair  Judgement:  Fair  Insight:  Fair  Psychomotor Activity:  Normal  Concentration:  Concentration: Fair and Attention Span: Fair  Recall:  [employer] of Knowledge:  Good  Language:  Good  Akathisia:  No  Handed:  Right  AIMS (if indicated):     Assets:  Communication Skills Housing Physical Health Social Support  ADL's:  Intact  Cognition:  Impaired,  Mild  Sleep:  Number of Hours: 7.45     Treatment Plan Summary:  Mr. Berisha is a 26 year old male with a history of bipolar disorder admitted for psychotic break.  Proceeding with caution (reappearance of mania) as lithium was just discontinued 2 days ago.  1. Bipolar disorder. He is treated with lithium and Invega for mood stabilization. Li level on 12/30 was 1.2. Cr was slightly elevated but is normal now. He wants to discontinue lithium and switch to Trileptal. Lithium was d/c on 12/31. He agreed to take [place] injection on 12/29. Next  injection of invega 156 mg due today  2. Cannabis Use Disorder: The patient was advised to abstain from marijuana and all illicit drugs as they may worsen psychosis.He minimizes marijuana use and is not interested in any substance abuse treatment  3. Insomnia: Continue Restoril 15 mg by mouth daily at bedtime. Earlier in his hospitalization he was tried on Ambien which was ineffective  4. Metabolic syndrome monitoring: HbA1c and lipid panel have been completed  5. Chronic pain. He complains of back and neck pain from scoliosis. He has Voltaren gel.   6. Disposition. He will be discharged to home. He will follow up for outpatient psychotropic medication management  Patient appears to be much improved. He is a scheduled to receive second injection of [place] today. We will need to set up a follow-up with psychiatry in [place] prior to discharge.  Potential discharge in the next 24h----will need a letter for school. We will need to contact family and is shortly do not have any concerns prior to discharge tomorrow.  Hildred Priest, MD 09/21/2016, 11:36 AM

## 2016-09-21 NOTE — Care Management (Addendum)
In my interaction with Isaiah Garza I have found him to be a very pleasant individual. Although we have different traditions his perspective/take on life seems to be healthy and gives him balance. He has participated in group and contributed in thought to the conversations. This morning he was reading a book about miracles and seemed to be well.    Also, the patient did still complain about feeling sluggish. He described his med regimen as a 'med cocktail'.

## 2016-09-21 NOTE — Progress Notes (Signed)
Patient ID: Isaiah Garza, male   DOB: [birth year], 26 y.o.   MRN: [id number] "Isaiah Garza" is much better, thoughts are more organized, able to demonstrate return of teaching in his own way regarding the Invega: the loading dose and maintenance once a month or every 28 days; he is happy with this plan, "If I can stay on this medication, I will be okay ..." Appearance: wearing jeans, a shirt and a black leather jacket, no crying spells, visible in the milieu, verbalized that he is feeling much better. Denied SI/SIB/HI, denied AV/H.

## 2016-09-21 NOTE — Progress Notes (Signed)
Patient ID: Bing Neighborsharles A Allard, male   DOB: 1991-02-01, 26 y.o.   MRN: [id number] Spoke with Pt's mother, Edwyna ReadyMartha Gagliardo who plans to pick Pt up after a work meeting tomorrow so she would be here to pick up Leonette Mostharles around 12:30/1:00pm.  SW will notify RN appropriately.  Jake SharkSara Marvetta Vohs, MSW, LCSW

## 2016-09-22 NOTE — Progress Notes (Signed)
Patient ID: Isaiah Garza, male   DOB: 03-30-1991, 26 y.o.   MRN: [id number] Visitation by sister, Vanice Sarahbi, went well; anticipating discharge in the morning. Behavior appropriate, no odd bizarre behavior, no religious preoccupation, "I am going back to school to major in [employer], [employer], [employer] ...." Temazepam capsule 15 mg PO, PRN given for insomnia.

## 2016-09-22 NOTE — Tx Team (Signed)
Interdisciplinary Treatment and Diagnostic Plan Update  09/22/2016 Time of Session: 10:30am Isaiah Garza MRN: [id number]  Principal Diagnosis: Bipolar affective disorder, manic, severe, with psychotic behavior (HCC)  Secondary Diagnoses: Principal Problem:   Bipolar affective disorder, manic, severe, with psychotic behavior (HCC) Active Problems:   Cannabis use disorder, moderate, dependence (HCC)   Noncompliance   Current Medications:  Current Facility-Administered Medications  Medication Dose Route Frequency Provider Last Rate Last Dose  . acetaminophen (TYLENOL) tablet 650 mg  650 mg Oral Q4H PRN Charm RingsJamison Y Lord, NP   650 mg at 09/20/16 0811  . alum & mag hydroxide-simeth (MAALOX/MYLANTA) 200-200-20 MG/5ML suspension 30 mL  30 mL Oral PRN Charm RingsJamison Y Lord, NP      . carbamide peroxide (DEBROX) 6.5 % otic solution 5 drop  5 drop Left Ear BID Jimmy FootmanAndrea Hernandez-Gonzalez, MD   5 drop at 09/21/16 1654  . diclofenac sodium (VOLTAREN) 1 % transdermal gel 4 g  4 g Topical QID Shari ProwsJolanta B Pucilowska, MD   4 g at 09/22/16 1123  . fluvoxaMINE (LUVOX) tablet 100 mg  100 mg Oral QHS Jolanta B Pucilowska, MD   100 mg at 09/21/16 2115  . ibuprofen (ADVIL,MOTRIN) tablet 600 mg  600 mg Oral Q8H PRN Charm RingsJamison Y Lord, NP   600 mg at 09/19/16 1414  . neomycin-polymyxin-hydrocortisone (CORTISPORIN) otic suspension 1 drop  1 drop Left Ear BID Jimmy FootmanAndrea Hernandez-Gonzalez, MD   1 drop at 09/21/16 1657  . Oxcarbazepine (TRILEPTAL) tablet 300 mg  300 mg Oral BID Shari ProwsJolanta B Pucilowska, MD   300 mg at 09/22/16 0819  . paliperidone (INVEGA SUSTENNA) injection 234 mg  234 mg Intramuscular Q28 days Shari ProwsJolanta B Pucilowska, MD   234 mg at 09/17/16 1609  . paliperidone (INVEGA) 24 hr tablet 6 mg  6 mg Oral QHS Jimmy FootmanAndrea Hernandez-Gonzalez, MD      . temazepam (RESTORIL) capsule 15 mg  15 mg Oral QHS PRN Jimmy FootmanAndrea Hernandez-Gonzalez, MD   15 mg at 09/21/16 2215   PTA Medications: Prescriptions Prior to Admission  Medication Sig  Dispense Refill Last Dose  . lithium 600 MG capsule Take 600 mg by mouth 2 (two) times daily.     Marland Kitchen. OLANZapine (ZYPREXA) 15 MG tablet Take 15 mg by mouth at bedtime.     . traMADol (ULTRAM) 50 MG tablet Take 1 tablet (50 mg total) by mouth every 8 (eight) hours as needed for pain. (Patient not taking: Reported on 01/16/2015) 30 tablet 0 Not Taking   Patient Stressors: Health problems Medication change or noncompliance  Patient Strengths: Manufacturing systems engineerCommunication skills Physical Health  Treatment Modalities: Medication Management, Group therapy, Case management,  1 to 1 session with clinician, Psychoeducation, Recreational therapy.   Physician Treatment Plan for Primary Diagnosis: Bipolar affective disorder, manic, severe, with psychotic behavior (HCC) Long Term Goal(s): Improvement in symptoms so as ready for discharge Improvement in symptoms so as ready for discharge   Short Term Goals: Ability to verbalize feelings will improve Compliance with prescribed medications will improve Ability to demonstrate self-control will improve Ability to identify and develop effective coping behaviors will improve Ability to identify triggers associated with substance abuse/mental health issues will improve  Medication Management: Evaluate patient's response, side effects, and tolerance of medication regimen.  Therapeutic Interventions: 1 to 1 sessions, Unit Group sessions and Medication administration.  Evaluation of Outcomes: Adequate for discharge  Physician Treatment Plan for Secondary Diagnosis: Principal Problem:   Bipolar affective disorder, manic, severe, with psychotic behavior (HCC) Active Problems:  Cannabis abuse   Noncompliance  Long Term Goal(s): Improvement in symptoms so as ready for discharge Improvement in symptoms so as ready for discharge   Short Term Goals: Ability to verbalize feelings will improve Compliance with prescribed medications will improve Ability to  demonstrate self-control will improve Ability to identify and develop effective coping behaviors will improve Ability to identify triggers associated with substance abuse/mental health issues will improve     Medication Management: Evaluate patient's response, side effects, and tolerance of medication regimen.  Therapeutic Interventions: 1 to 1 sessions, Unit Group sessions and Medication administration.  Evaluation of Outcomes: Adequate for discharge   RN Treatment Plan for Primary Diagnosis: Bipolar affective disorder, manic, severe, with psychotic behavior (HCC) Long Term Goal(s): Knowledge of disease and therapeutic regimen to maintain health will improve  Short Term Goals: Ability to participate in decision making will improve, Ability to verbalize feelings will improve, Ability to identify and develop effective coping behaviors will improve and Compliance with prescribed medications will improve  Medication Management: RN will administer medications as ordered by provider, will assess and evaluate patient's response and provide education to patient for prescribed medication. RN will report any adverse and/or side effects to prescribing provider.  Therapeutic Interventions: 1 on 1 counseling sessions, Psychoeducation, Medication administration, Evaluate responses to treatment, Monitor vital signs and CBGs as ordered, Perform/monitor CIWA, COWS, AIMS and Fall Risk screenings as ordered, Perform wound care treatments as ordered.  Evaluation of Outcomes: Adequate for discharge   LCSW Treatment Plan for Primary Diagnosis: Bipolar affective disorder, manic, severe, with psychotic behavior (HCC) Long Term Goal(s): Safe transition to appropriate next level of care at discharge, Engage patient in therapeutic group addressing interpersonal concerns.  Short Term Goals: Engage patient in aftercare planning with referrals and resources, Facilitate acceptance of mental health diagnosis  and concerns and Increase skills for wellness and recovery  Therapeutic Interventions: Assess for all discharge needs, 1 to 1 time with Social worker, Explore available resources and support systems, Assess for adequacy in community support network, Educate family and significant other(s) on suicide prevention, Complete Psychosocial Assessment, Interpersonal group therapy.  Evaluation of Outcomes:Adequate for discharge   Progress in Treatment: Attending groups: Yes. Participating in groups: Yes. Taking medication as prescribed: Yes. Toleration medication: Yes. Family/Significant other contact made: Yes, individual(s) contacted:  father and mother Patient understands diagnosis: Yes. Discussing patient identified problems/goals with staff: Yes. Medical problems stabilized or resolved: Yes. Denies suicidal/homicidal ideation: Yes. Issues/concerns per patient self-inventory: No. Other: none  New problem(s) identified: No, Describe:  None identified  New Short Term/Long Term Goal(s):none  Discharge Plan or Barriers: Return to outpt treatment in [place].  Pt will be living with his mother at discharge.  Reason for Continuation of Hospitalization: Mania  Estimated Length of Stay:0 days   Attendees: Patient:Isaiah Garza 09/22/2016 1:13 PM  Physician: Radene Journey, MD 09/22/2016 1:13 PM  Nursing: Elenore Paddy, RN 09/22/2016 1:13 PM  RN Care Manager: 09/22/2016 1:13 PM  Social Worker: Jake Shark, LCSW 09/22/2016 1:13 PM  Recreational Therapist: Hershal Coria, LRT 09/22/2016 1:13 PM  Other:  09/22/2016 1:13 PM  Other:  09/22/2016 1:13 PM  Other: 09/22/2016 1:13 PM    Scribe for Treatment Team: Glennon Mac, LCSW 09/22/2016 1:13 PM

## 2016-09-22 NOTE — Plan of Care (Signed)
Problem: Activity: Goal: Sleeping patterns will improve Outcome: Progressing Patient slept for Estimated Hours of 8; safety maintained, no injury or falls during this shift.    

## 2016-09-22 NOTE — Progress Notes (Signed)
Patient with bright affect, cooperative behavior with meals, meds and plan of care. No SI/HI at this time. Pleasant and talkative with staff and peers. Therapy encouraged. Safety maintained.

## 2016-09-22 NOTE — [facility] Suicide Risk Assessment (Signed)
Patient to discharge when Mom arrives to transport. Verbalizes understanding rt recommended plan of care. Acknowledges all belongings have been returned. Safety maintained. Reviewed discharge plan with Mom, states understanding. 7 day supply provided.

## 2016-09-22 NOTE — [facility] Group Notes (Signed)
[facility] LCSW Group Therapy   09/22/2016  9:30 am   Type of Therapy: Group Therapy   Participation Level: Active   Participation Quality: Attentive, Sharing and Supportive   Affect: Appropriate  Cognitive: Alert and Oriented   Insight: Developing/Improving and Engaged   Engagement in Therapy: Developing/Improving and Engaged   Modes of Intervention: Clarification, Confrontation, Discussion, Education, Exploration, Limit-setting, Orientation, Problem-solving, Rapport Building, Dance movement psychotherapisteality Testing, Socialization and Support   Summary of Progress/Problems: The topic for group today was emotional regulation. This group focused on both positive and negative emotion identification and allowed  group members to process ways to identify feelings, regulate negative emotions, and find healthy ways to manage internal/external emotions. Group members were asked to reflect on a time when their reaction to an emotion led to a negative outcome and explored how alternative responses using emotion regulation would have benefited them. Group members were also asked to discuss a time when emotion regulation was utilized when a negative emotion was experienced. Pt reports the pt regulates the pt's emotions by exercising, stretching and  "being outdoors.  Pt reported at one time the pt wanted to ventilate frustration and could not, so the pt did not stretch in front of people due to embarrassment and instead acted out.  In the future the pt intends to regulate the pt's emotions by exercising or stretching regardless of who is watching.  Pt was polite and cooperative with the CSW and other group members and focused and attentive to the topics discussed and the sharing of others.     Dorothe PeaJonathan F. Graylen Noboa, MSW, LCSWA, LCAS

## 2016-09-27 ENCOUNTER — Encounter: Payer: Self-pay | Admitting: Internal Medicine

## 2016-09-27 ENCOUNTER — Ambulatory Visit (INDEPENDENT_AMBULATORY_CARE_PROVIDER_SITE_OTHER): Payer: BLUE CROSS/BLUE SHIELD | Admitting: Internal Medicine

## 2016-09-27 VITALS — BP 118/76 | HR 72 | Temp 98.9°F | Wt 170.0 lb

## 2016-09-27 DIAGNOSIS — F302 Manic episode, severe with psychotic symptoms: Secondary | ICD-10-CM | POA: Diagnosis not present

## 2016-09-27 NOTE — Patient Instructions (Signed)
Bipolar 1 Disorder  Bipolar 1 disorder is a mental health disorder in which a person has episodes of emotional highs (mania), and may also have episodes of emotional lows (depression) in addition to highs. Bipolar 1 disorder is different from other bipolar disorders because it involves extreme manic episodes. These episodes last at least one week or involve symptoms that are so severe that hospitalization is needed to keep the person safe.  What increases the risk?  The cause of this condition is not known. However, certain factors make you more likely to have bipolar disorder, such as:  · Having a family member with the disorder.  · An imbalance of certain chemicals in the brain (neurotransmitters).  · Stress, such as illness, financial problems, or a death.  · Certain conditions that affect the brain or spinal cord (neurologic conditions).  · Brain injury (trauma).  · Having another mental health disorder, such as:  ? Obsessive compulsive disorder.  ? Schizophrenia.    What are the signs or symptoms?  Symptoms of mania include:  · Very high self-esteem or self-confidence.  · Decreased need for sleep.  · Unusual talkativeness or feeling a need to keep talking. Speech may be very fast. It may seem like you cannot stop talking.  · Racing thoughts or constant talking, with quick shifts between topics that may or may not be related (flight of ideas).  · Decreased ability to focus or concentrate.  · Increased purposeful activity, such as work, studies, or social activity.  · Increased nonproductive activity. This could be pacing, squirming and fidgeting, or finger and toe tapping.  · Impulsive behavior and poor judgment. This may result in high-risk activities, such as having unprotected sex or spending a lot of money.    Symptoms of depression include:  · Feeling sad, hopeless, or helpless.  · Frequent or uncontrollable crying.  · Lack of feeling or caring about anything.  · Sleeping too much.  · Moving more slowly  than usual.  · Not being able to enjoy things you used to enjoy.  · Wanting to be alone all the time.  · Feeling guilty or worthless.  · Lack of energy or motivation.  · Trouble concentrating or remembering.  · Trouble making decisions.  · Increased appetite.  · Thoughts of death, or the desire to harm yourself.    Sometimes, you may have a mixed mood. This means having symptoms of depression and mania. Stress can make symptoms worse.  How is this diagnosed?  To diagnose bipolar disorder, your health care provider may ask about your:  · Emotional episodes.  · Medical history.  · Alcohol and drug use. This includes prescription medicines. Certain medical conditions and substances can cause symptoms that seem like bipolar disorder (secondary bipolar disorder).    How is this treated?  Bipolar disorder is a long-term (chronic) illness. It is best controlled with ongoing (continuous) treatment rather than treatment only when symptoms occur. Treatment may include:  · Medicine. Medicine can be prescribed by a provider who specializes in treating mental disorders (psychiatrist).  ? Medicines called mood stabilizers are usually prescribed.  ? If symptoms occur even while taking a mood stabilizer, other medicines may be added.  · Psychotherapy. Some forms of talk therapy, such as cognitive-behavioral therapy (CBT), can provide support, education, and guidance.  · Coping methods, such as journaling or relaxation exercises. These may include:  ? Yoga.  ? Meditation.  ? Deep breathing.  · Lifestyle changes, such as:  ?   Limiting alcohol and drug use.  ? Exercising regularly.  ? Getting plenty of sleep.  ? Making healthy eating choices.    A combination of medicine, talk therapy, and coping methods is best. A procedure in which electricity is applied to the brain through the scalp (electroconvulsive therapy) may be used in cases of severe mania when medicine and psychotherapy work too slowly or do not work.  Follow these  instructions at home:  Activity     · Return to your normal activities as told by your health care provider.  · Find activities that you enjoy, and make time to do them.  · Exercise regularly as told by your health care provider.  Lifestyle   · Limit alcohol intake to no more than 1 drink a day for nonpregnant women and 2 drinks a day for men. One drink equals 12 oz of beer, 5 oz of wine, or 1½ oz of hard liquor.  · Follow a set schedule for eating and sleeping.  · Eat a balanced diet that includes fresh fruits and vegetables, whole grains, low-fat dairy, and lean meat.  · Get 7-8 hours of sleep each night.  General instructions   · Take over-the-counter and prescription medicines only as told by your health care provider.  · Think about joining a support group. Your health care provider may be able to recommend a support group.  · Talk with your family and loved ones about your treatment goals and how they can help.  · Keep all follow-up visits as told by your health care provider. This is important.  Where to find more information:  For more information about bipolar disorder, visit the following websites:  · National Alliance on Mental Illness: www.nami.org  · U.S. National Institute of Mental Health: www.nimh.nih.gov    Contact a health care provider if:  · Your symptoms get worse.  · You have side effects from your medicine, and they get worse.  · You have trouble sleeping.  · You have trouble doing daily activities.  · You feel unsafe in your surroundings.  · You are dealing with substance abuse.  Get help right away if:  · You have new symptoms.  · You have thoughts about harming yourself.  · You self-harm.  This information is not intended to replace advice given to you by your health care provider. Make sure you discuss any questions you have with your health care provider.  Document Released: 12/13/2000 Document Revised: 05/02/2016 Document Reviewed: 05/06/2016  Elsevier Interactive Patient Education ©  2017 Elsevier Inc.

## 2016-09-27 NOTE — Progress Notes (Signed)
Subjective:    Patient ID: Isaiah Garza, male    DOB: March 05, 1991, 26 y.o.   MRN: [id number]  HPI  Pt presents to the clinic today for hospital follow up. He was admitted 09/13/16-1-3/18 for psychosis secondary to bipolar disorder. He had stopped all of his medication approximately 5 days prior to admission. His family reports he had been talking about suicide, but upon further questioning, he was found not to be a suicidal risk. He was evaluated and treated with Luvox, Trileptal and Invega. He was also started on Restoril for insomnia. Since discharge he reports " I feel fine, but the problem is I always feel fine". There was a long discussion about the importance of medication compliance and discontinuation of substance abuse. He has an outpatient follow up with [facility] on 09/30/16. He follows up with Dr. Lolly Mustache on 2/218.  Review of Systems      Past Medical History:  Diagnosis Date  . ADD (attention deficit disorder)   . Allergy   . Asthma   . Bipolar 1 disorder (HCC)     Current Outpatient Prescriptions  Medication Sig Dispense Refill  . fluvoxaMINE (LUVOX) 100 MG tablet Take 1 tablet (100 mg total) by mouth at bedtime. 30 tablet 0  . Oxcarbazepine (TRILEPTAL) 300 MG tablet Take 1 tablet (300 mg total) by mouth 2 (two) times daily. 60 tablet 0  . [START ON 10/15/2016] paliperidone (INVEGA SUSTENNA) 156 MG/ML SUSP injection Inject 1 mL (156 mg total) into the muscle once. Due January 26 1 mL 0  . paliperidone (INVEGA) 6 MG 24 hr tablet Take 1 tablet (6 mg total) by mouth at bedtime. 30 tablet 0  . temazepam (RESTORIL) 15 MG capsule Take 1 capsule (15 mg total) by mouth at bedtime as needed for sleep. 15 capsule 0   No current facility-administered medications for this visit.     Allergies  Allergen Reactions  . Other     Pt states that all medicines are poison antibiotics, ssri's etc. Could not get a concrete answer on this     Family History  Problem Relation  Age of Onset  . Cancer Father     testicular  . Diabetes Neg Hx   . Heart disease Neg Hx   . Early death Neg Hx   . Stroke Neg Hx     Social History   Social History  . Marital status: Single    Spouse name: N/A  . Number of children: N/A  . Years of education: N/A   Occupational History  . cook    Social History Main Topics  . Smoking status: Former Games developer  . Smokeless tobacco: Never Used  . Alcohol use 1.2 oz/week    1 Cans of beer, 1 Shots of liquor per week     Comment: social - only about 3 drinks  . Drug use: No  . Sexual activity: Yes   Other Topics Concern  . Not on file   Social History Narrative   Exercise - assorted (walking, running, cycling, and cardio)     Constitutional: Denies fever, malaise, fatigue, headache or abrupt weight changes.  Respiratory: Denies difficulty breathing, shortness of breath, cough or sputum production.   Cardiovascular: Denies chest pain, chest tightness, palpitations or swelling in the hands or feet.  Gastrointestinal: Denies abdominal pain, bloating, constipation, diarrhea or blood in the stool.  GU: Denies urgency, frequency, pain with urination, burning sensation, blood in urine, odor or discharge. Neurological: Denies  dizziness, difficulty with memory, difficulty with speech or problems with balance and coordination.  Psych: Denies anxiety, depression, SI/HI.  No other specific complaints in a complete review of systems (except as listed in HPI above).  Objective:   Physical Exam  BP 118/76   Pulse 72   Temp 98.9 F (37.2 C) (Oral)   Wt 170 lb (77.1 kg)   SpO2 98%   BMI 21.25 kg/m  Wt Readings from Last 3 Encounters:  09/27/16 170 lb (77.1 kg)  09/13/16 168 lb (76.2 kg)  01/16/15 150 lb 9.6 oz (68.3 kg)    General: Appears his stated age, well developed, well nourished in NAD. Cardiovascular: Normal rate and rhythm. S1,S2 noted.   Pulmonary/Chest: Normal effort and positive vesicular breath sounds. No  respiratory distress. No wheezes, rales or ronchi noted.  Neurological: Alert and oriented. Psychiatric: Mood and affect normal. Behavior is normal. Judgment and thought content normal.     BMET    Component Value Date/Time   NA 138 09/18/2016 0817   K 4.1 09/18/2016 0817   CL 103 09/18/2016 0817   CO2 30 09/18/2016 0817   GLUCOSE 136 (H) 09/18/2016 0817   BUN 9 09/18/2016 0817   CREATININE 1.00 09/18/2016 0817   CREATININE 0.88 01/16/2015 1531   CALCIUM 9.5 09/18/2016 0817   GFRNONAA >60 09/18/2016 0817   GFRAA >60 09/18/2016 0817    Lipid Panel     Component Value Date/Time   CHOL 109 09/18/2016 0817   TRIG 95 09/18/2016 0817   HDL 43 09/18/2016 0817   CHOLHDL 2.5 09/18/2016 0817   VLDL 19 09/18/2016 0817   LDLCALC 47 09/18/2016 0817    CBC    Component Value Date/Time   WBC 10.7 (H) 09/12/2016 2313   RBC 6.03 (H) 09/12/2016 2313   HGB 18.6 (H) 09/12/2016 2313   HCT 51.2 09/12/2016 2313   PLT 344 09/12/2016 2313   MCV 84.9 09/12/2016 2313   MCH 30.8 09/12/2016 2313   MCHC 36.3 (H) 09/12/2016 2313   RDW 13.4 09/12/2016 2313   LYMPHSABS 1.3 01/16/2015 1531   MONOABS 0.4 01/16/2015 1531   EOSABS 0.1 01/16/2015 1531   BASOSABS 0.0 01/16/2015 1531    Hgb A1C Lab Results  Component Value Date   HGBA1C 5.3 09/18/2016            Assessment & Plan:   Hospital Followup for Bipolar with Psychosis:  Hospital notes and labs reviewed He appears stable His biggest concern is if he can drink ETOH while on meds- advised him an occasional beer should be okay but not more than 1-2 a week, and he should not binge drink He will follow up with Dr. Lolly MustacheArfeen Will plan to monitor CMET at least every 6 months  RTC as needed Nicki ReaperBAITY, REGINA, NP

## 2016-09-30 ENCOUNTER — Ambulatory Visit (HOSPITAL_COMMUNITY): Payer: BC Managed Care – PPO | Admitting: Licensed Clinical Social Worker

## 2016-10-21 ENCOUNTER — Ambulatory Visit (HOSPITAL_COMMUNITY): Payer: Self-pay | Admitting: Psychiatry

## 2016-10-21 DIAGNOSIS — I2699 Other pulmonary embolism without acute cor pulmonale: Secondary | ICD-10-CM

## 2016-10-21 HISTORY — DX: Other pulmonary embolism without acute cor pulmonale: I26.99

## 2016-10-22 ENCOUNTER — Ambulatory Visit (HOSPITAL_COMMUNITY): Payer: Self-pay | Admitting: Psychiatry

## 2016-11-03 ENCOUNTER — Ambulatory Visit (INDEPENDENT_AMBULATORY_CARE_PROVIDER_SITE_OTHER): Payer: BLUE CROSS/BLUE SHIELD | Admitting: Psychiatry

## 2016-11-03 ENCOUNTER — Encounter (INDEPENDENT_AMBULATORY_CARE_PROVIDER_SITE_OTHER): Payer: Self-pay

## 2016-11-03 ENCOUNTER — Encounter (HOSPITAL_COMMUNITY): Payer: Self-pay | Admitting: Psychiatry

## 2016-11-03 VITALS — BP 116/68 | HR 83 | Ht 75.0 in | Wt 169.8 lb

## 2016-11-03 DIAGNOSIS — F319 Bipolar disorder, unspecified: Secondary | ICD-10-CM | POA: Diagnosis not present

## 2016-11-03 DIAGNOSIS — F1290 Cannabis use, unspecified, uncomplicated: Secondary | ICD-10-CM | POA: Diagnosis not present

## 2016-11-03 DIAGNOSIS — Z888 Allergy status to other drugs, medicaments and biological substances status: Secondary | ICD-10-CM

## 2016-11-03 DIAGNOSIS — Z87891 Personal history of nicotine dependence: Secondary | ICD-10-CM | POA: Diagnosis not present

## 2016-11-03 DIAGNOSIS — Z818 Family history of other mental and behavioral disorders: Secondary | ICD-10-CM | POA: Diagnosis not present

## 2016-11-03 DIAGNOSIS — Z810 Family history of intellectual disabilities: Secondary | ICD-10-CM

## 2016-11-03 DIAGNOSIS — Z79899 Other long term (current) drug therapy: Secondary | ICD-10-CM | POA: Diagnosis not present

## 2016-11-03 DIAGNOSIS — Z811 Family history of alcohol abuse and dependence: Secondary | ICD-10-CM | POA: Diagnosis not present

## 2016-11-03 MED ORDER — PALIPERIDONE ER 1.5 MG PO TB24: 4.5000 mg | ORAL_TABLET | Freq: Every day | ORAL | 1 refills | Status: DC

## 2016-11-03 MED ORDER — TEMAZEPAM 15 MG PO CAPS: 15.0000 mg | ORAL_CAPSULE | Freq: Every day | ORAL | 1 refills | Status: DC

## 2016-11-03 MED ORDER — LURASIDONE HCL 20 MG PO TABS: 20.0000 mg | ORAL_TABLET | Freq: Every morning | ORAL | 1 refills | Status: DC

## 2016-11-03 MED ORDER — OXCARBAZEPINE 300 MG PO TABS: 300.0000 mg | ORAL_TABLET | Freq: Two times a day (BID) | ORAL | 1 refills | Status: DC

## 2016-11-03 NOTE — Patient Instructions (Signed)
Stop NAC Stop Theanine Stop Gingko Baloba  Continue Trileptal Decrease Invega to 4.5 mg nightly (3 tablets) Start Latuda 20 mg in the morning Restart Restoril 15 mg nightly  Come back in 2 weeks or sooner if needed

## 2016-11-03 NOTE — Progress Notes (Signed)
Psychiatric Initial Adult Assessment   Patient Identification: Isaiah Garza MRN:  [id number] Date of Evaluation:  11/03/2016 Referral Source: [facility] Chief Complaint:   Visit Diagnosis:    ICD-9-CM ICD-10-CM   1. Bipolar I disorder (HCC) 296.7 F31.9     History of Present Illness:  Isaiah MostCharles is a 26 year old male with bipolar disorder [facility].  Was stabilized on [place] + Lithium.  Last lithium level was 09/18/16 and returned at 1.2.  Last Luvenia Starchnvega Sustena IM was 09/21/16.  He was discharged on 1/3 and continued on Luvox, Trileptal, Paliperidone 6 mg PO daily, Restoril 15 mg QHS.  Patient presents on time today. Presents as calm, and somewhat drowsy.  He shares his past history of mania, starting in high school.  We explored his thoughts about his diagnosis and he ultimately agrees that he is bipolar and needs medication management for the highs and lows.  [facility] once in the past month since discharge.  His use was one time, in hopes to improve his depressed mood.  It was not effective.  He presents with insight into the delusional nature of his behaviors.  He identifies as spiritually inclined but admits that "my mind was playing tricks on me and things were way weirder than they should have been."    At present, he sleeps nightly about 8-10 hours, but feels drowsy, groggy and poorly rested the following day.  He has trouble thinking and feels mentally slow.  He feels depressed reflecting on his goals and how behind he is for his age.  He is hopeful to restart college but lacks the motivation to work on the application for [employer].  He denies any suicidal thoughts.  Denies any paranoia, Ah, Vh.  He does not have any grandiosity, and his thoughts are not racing.  He mostly just feels very down/low and in the dumps.  He ran out of luvox about 5 days ago and has been off of it.  He isn't sure of any effects  in being off this. We discussed the mechanism of SSRI and agreed to keep patient of SSRI. He is very much wanting to be off invega.  He feels that this, much like risperidone, makes him very drowsy, depressed, unmotivated.  We reviewed the pathophysiology of bipolar mania and depression.  He had similar responses to seroquel as well.  Does not want to be on things that cause substantial weight gain.  Is opposed to Lithium for this reason as well.  Discussed the strategy to cross [facility] for [facility] depression, and to maintain his present dose of trileptal for added mood stabilization.  I spent time discussing with patient that Latuda may afford superior depressive symptom control, but has not been established for bipolar mania.  Discussed signs/symptoms to be aware of, and to also augment the cross taper with restoril for consistent nightly sleep.  He was agreeable to these changes, and following up on a q2weeks basis while we adjust his outpatient regimen.  We discussed his additional supplements such as theanine, gingko and Nac and patient agrees to discontinue these given the paucity of evidence and that they may just confuse the medication regimen.   Patient and I spent time discussing the difficulties of coping with his diagnosis, and how this effects his self esteem, future goals, and psyche.  Spent time aligning with him on a strategy to  set and reach his goals of college.  We also discussed his incorporating a daily exercise regimen, given the positive benefits of cardiovascular exercise for mood and metabolic control.   Per [facility]: 1 script for Temazepam 15 mg qhs on 09/23/16 from Dr. Huntley Dec   Associated Signs/Symptoms: Depression Symptoms:  depressed mood, hypersomnia, psychomotor retardation, difficulty concentrating, anxiety, decreased labido, increased appetite, (Hypo) Manic Symptoms:  no present mania symptoms Anxiety Symptoms:  Social  Anxiety, Psychotic Symptoms:  no active psychosis PTSD Symptoms: none  Past Psychiatric History: 4 psychiatric hospitalizations since teenage years, for clear manic episodes.  He was having sleeplessness, racing thoughts, goal directed activities, thought that he was a mosiah.  He thought that he could read minds.  He recalls not being able to turn his brain off, and feeling that he was going "insane."  Previous Psychotropic Medications: Yes   Substance Abuse History in the last 12 months:  Yes.    Consequences of Substance Abuse: Medical Consequences:  disturbance of mood  Past Medical History:  Past Medical History:  Diagnosis Date  . ADD (attention deficit disorder)   . Allergy   . Asthma   . Bipolar 1 disorder (HCC)    History reviewed. No pertinent surgical history.  Family Psychiatric History: none  Family History:  Family History  Problem Relation Age of Onset  . Cancer Father     testicular  . ADD / ADHD Father   . Depression Father   . Alcohol abuse Mother   . Depression Mother   . Bipolar disorder Maternal Grandmother   . Dementia Maternal Grandmother   . Bipolar disorder Maternal Aunt   . Bipolar disorder Cousin   . Diabetes Neg Hx   . Heart disease Neg Hx   . Early death Neg Hx   . Stroke Neg Hx     Social History:   Social History   Social History  . Marital status: Single    Spouse name: N/A  . Number of children: N/A  . Years of education: N/A   Occupational History  . cook    Social History Main Topics  . Smoking status: Former Games developer  . Smokeless tobacco: Never Used  . Alcohol use 1.2 oz/week    2 Cans of beer per week     Comment: social - only about 3 drinks  . Drug use: Yes    Types: Marijuana     Comment: periodic use of marijuana  . Sexual activity: Yes    Partners: Female    Birth control/ protection: Condom   Other Topics Concern  . None   Social History Narrative   Exercise - assorted (walking, running, cycling, and  cardio)    Additional Social History: previous Consulting civil engineer at [employer]  Allergies:   Allergies  Allergen Reactions  . Other     Pt states that all medicines are poison antibiotics, ssri's etc. Could not get a concrete answer on this     Metabolic Disorder Labs: Lab Results  Component Value Date   HGBA1C 5.3 09/18/2016   MPG 105 09/18/2016   No results found for: PROLACTIN Lab Results  Component Value Date   CHOL 109 09/18/2016   TRIG 95 09/18/2016   HDL 43 09/18/2016   CHOLHDL 2.5 09/18/2016   VLDL 19 09/18/2016   LDLCALC 47 09/18/2016   LDLCALC 79 01/16/2015     Current Medications: Current Outpatient Prescriptions  Medication Sig Dispense Refill  . Omega-3 Fatty Acids (FISH OIL) 1000 MG  CAPS Take 1 capsule by mouth 2 (two) times daily.    . Oxcarbazepine (TRILEPTAL) 300 MG tablet Take 1 tablet (300 mg total) by mouth 2 (two) times daily. 60 tablet 1  . paliperidone 1.5 MG TB24 Take 3 tablets (4.5 mg total) by mouth at bedtime. 60 tablet 1  . temazepam (RESTORIL) 15 MG capsule Take 1 capsule (15 mg total) by mouth at bedtime. 30 capsule 1  . lurasidone (LATUDA) 20 MG TABS tablet Take 1 tablet (20 mg total) by mouth every morning. 30 tablet 1  . paliperidone (INVEGA SUSTENNA) 156 MG/ML SUSP injection Inject 1 mL (156 mg total) into the muscle once. Due January 26 1 mL 0   No current facility-administered medications for this visit.     Neurologic: Headache: Negative Seizure: Negative Paresthesias:Negative  Musculoskeletal: Strength & Muscle Tone: within normal limits Gait & Station: normal Patient leans: N/A  Psychiatric Specialty Exam: Review of Systems  Constitutional: Negative.   HENT: Negative.   Respiratory: Negative.   Cardiovascular: Negative.   Gastrointestinal: Negative.   Musculoskeletal: Negative.   Neurological: Negative.   Psychiatric/Behavioral: Positive for depression.    Blood pressure 116/68, pulse 83, height 6\' 3"  (1.905 m), weight 77 kg (169  lb 12.8 oz).Body mass index is 21.22 kg/m.  General Appearance: Casual and Fairly Groomed  Eye Contact:  Good  Speech:  Clear and Coherent and Normal Rate  Volume:  Normal  Mood:  Depressed and Dysphoric  Affect:  Congruent  Thought Process:  Coherent and Goal Directed  Orientation:  Full (Time, Place, and Person)  Thought Content:  Logical  Suicidal Thoughts:  No  Homicidal Thoughts:  No  Memory:  Immediate;   Good  Judgement:  Fair  Insight:  Fair and Present  Psychomotor Activity:  Normal  Concentration:  Concentration: Fair and Attention Span: Fair  Recall:  [employer] of Knowledge:Fair  Language: Fair  Akathisia:  Negative  Handed:  Right  AIMS (if indicated):  0  Assets:  Communication Skills Desire for Improvement Financial Resources/Insurance Housing Leisure Time Physical Health Social Support Vocational/Educational  ADL's:  Intact  Cognition: WNL  Sleep:  8-10 hours nightly    Treatment Plan Summary: 26 year old male with bipolar I disorder, Garza recent episode manic with psychosis.  He is now over 1 month out from hospital stay and presents as constricted, depressed, and complains of low mood and poor motivation.  He does not present with any acute safety issues.   Will cross taper from invega to [facility] given his side effects from invega; latuda has excellent bipolar depression control and with an Aed on board, I am hopeful we can prevent a resurgence of mania. I have explained this risk to patient and he is quite dissatisfied with invega and wishes to switch.  I want to make sure I allign with his goals given his history of abrupt discontinuation.   # Bipolar I Disorder - Continue Trileptal 300 mg BID for mood lability; may ultimately increase given switch of AAP - Continue Invega at 4.5 mg nightly; patient declines to continue on LAI at this time - Start Lurasidone 20 mg daily for bipolar depression - Restart Restoril 15 mg qhs for sleep stability - Continue  Omega 3 supplement given dietary and presumed neurocognitive benefits - Have discontinued NAC, Gingko and Theanine - Discontinued Luvox (he ran out 5 days ago) given that SSRI provides little to know benefit in bipolar I patients, and he has no other  substantive mood, anxiety or OCD symptoms necessitating Luvox - RTC in 2 weeks  Burnard Leigh, MD 2/14/20182:41 PM

## 2016-11-10 ENCOUNTER — Ambulatory Visit (INDEPENDENT_AMBULATORY_CARE_PROVIDER_SITE_OTHER): Payer: BLUE CROSS/BLUE SHIELD | Admitting: Primary Care

## 2016-11-10 ENCOUNTER — Encounter: Payer: Self-pay | Admitting: Primary Care

## 2016-11-10 ENCOUNTER — Ambulatory Visit: Payer: Self-pay | Admitting: Internal Medicine

## 2016-11-10 ENCOUNTER — Ambulatory Visit (INDEPENDENT_AMBULATORY_CARE_PROVIDER_SITE_OTHER)
Admission: RE | Admit: 2016-11-10 | Discharge: 2016-11-10 | Disposition: A | Discharge status: Home / Self Care | Payer: BLUE CROSS/BLUE SHIELD | Source: Ambulatory Visit | Attending: Primary Care | Admitting: Primary Care

## 2016-11-10 VITALS — BP 128/82 | HR 88 | Temp 98.1°F | Ht 72.0 in | Wt 168.0 lb

## 2016-11-10 DIAGNOSIS — R071 Chest pain on breathing: Secondary | ICD-10-CM | POA: Diagnosis not present

## 2016-11-10 NOTE — Progress Notes (Signed)
Subjective:    Patient ID: Isaiah Garza, male    DOB: 04-13-1991, 26 y.o.   MRN: [id number]  HPI  Mr. Stalvey is a 26 year old male with a history of cannabis abuse and bipolar disorder who presents today with a chief complaint of chest pain. His pain is located to the left chest wall. He notices the chest pain with deep inspiration that has been present since November 2017 with more noticeable symptoms gradually since.   He describes his pain as a dull pain that is daily with a pressure that will come with anxiety. His left chest wall is tender to the touch. He denies recent long travel, injury, cough, fevers, wheezing, palpitations, esophageal burning, epigastric pain. He had an ECG during his last inpatient admission for mania which was normal. Light exercise makes it worse. He has an albuterol inhaler at home that he's used without improvement. He is a non smoker, occasional marijuana smoker (last occurrence was several weeks ago). Smoking marijuana doesn't make his symptoms worse or better.   He is due to see his psychiatrist next week. He believes his anxiety as been worse since his recent hospitalization in December 2017-January this year. He denies panic attacks recently, SI/HI. He does exercise in the gym three days weekly with lifting weights and doing yoga. His chest pain is not worse with weight lifting.  Review of Systems  Constitutional: Negative for fatigue.  HENT: Negative for congestion.   Respiratory: Negative for cough and shortness of breath.   Cardiovascular: Negative for chest pain.  Gastrointestinal: Negative for abdominal pain and nausea.  Musculoskeletal:       Chest wall pain  Psychiatric/Behavioral: The patient is nervous/anxious.        Past Medical History:  Diagnosis Date  . ADD (attention deficit disorder)   . Allergy   . Asthma   . Bipolar 1 disorder [facility])      Social History   Social History  . Marital status: Single    Spouse name: N/A  . Number  of children: N/A  . Years of education: N/A   Occupational History  . cook    Social History Main Topics  . Smoking status: Former Games developer  . Smokeless tobacco: Never Used  . Alcohol use 1.2 oz/week    2 Cans of beer per week     Comment: social - only about 3 drinks  . Drug use: Yes    Types: Marijuana     Comment: periodic use of marijuana  . Sexual activity: Yes    Partners: Female    Birth control/ protection: Condom   Other Topics Concern  . Not on file   Social History Narrative   Exercise - assorted (walking, running, cycling, and cardio)    No past surgical history on file.  Family History  Problem Relation Age of Onset  . Cancer Father     testicular  . ADD / ADHD Father   . Depression Father   . Alcohol abuse Mother   . Depression Mother   . Bipolar disorder Maternal Grandmother   . Dementia Maternal Grandmother   . Bipolar disorder Maternal Aunt   . Bipolar disorder Cousin   . Diabetes Neg Hx   . Heart disease Neg Hx   . Early death Neg Hx   . Stroke Neg Hx     Allergies  Allergen Reactions  . Other     Pt states that all medicines are poison antibiotics, ssri's  etc. Could not get a concrete answer on this     Current Outpatient Prescriptions on File Prior to Visit  Medication Sig Dispense Refill  . lurasidone (LATUDA) 20 MG TABS tablet Take 1 tablet (20 mg total) by mouth every morning. 30 tablet 1  . Omega-3 Fatty Acids (FISH OIL) 1000 MG CAPS Take 1 capsule by mouth 2 (two) times daily.    . Oxcarbazepine (TRILEPTAL) 300 MG tablet Take 1 tablet (300 mg total) by mouth 2 (two) times daily. 60 tablet 1  . paliperidone 1.5 MG TB24 Take 3 tablets (4.5 mg total) by mouth at bedtime. 60 tablet 1  . temazepam (RESTORIL) 15 MG capsule Take 1 capsule (15 mg total) by mouth at bedtime. 30 capsule 1   No current facility-administered medications on file prior to visit.     BP 128/82   Pulse 88   Temp 98.1 F (36.7 C) (Oral)   Ht 6' (1.829 m)   Wt  168 lb (76.2 kg)   SpO2 97%   BMI 22.78 kg/m    Objective:   Physical Exam  Constitutional: He appears well-nourished.  Neck: Neck supple.  Cardiovascular: Normal rate and regular rhythm.   Pulmonary/Chest: Effort normal and breath sounds normal. He has no wheezes. He has no rales. He exhibits tenderness.  Abdominal: Soft.  Musculoskeletal:       Arms: Tender upon palpation to left upper chest wall   Skin: Skin is warm and dry.  Psychiatric: He has a normal mood and affect.          Assessment & Plan:  Chest Wall Pain:  Located to left anterior chest wall x 3 months. Doesn't appear to be respiratory, cardiac, GI related. Suspect either MSK or anxiety related. Will obtain chest xray and d-dimer to ensure to other cause. ECG from last hospitalization reviewed. Discussed to notify his psychiatrist of his increased anxiety during his coming visit. Ibuprofen PRN.  Morrie Sheldonlark,Arvis Miguez Kendal, NP

## 2016-11-10 NOTE — Progress Notes (Signed)
Pre visit review using our clinic review tool, if applicable. No additional management support is needed unless otherwise documented below in the visit note. 

## 2016-11-10 NOTE — Patient Instructions (Signed)
The pain to your chest is most likely a muscle.  Complete lab work and xray prior to leaving today. I will notify you of your results once received.   Please mention your increased anxiety to your psychiatrist as this could be playing a role in your pain.  It was a pleasure meeting you!

## 2016-11-11 ENCOUNTER — Other Ambulatory Visit: Payer: Self-pay | Admitting: Primary Care

## 2016-11-11 DIAGNOSIS — R071 Chest pain on breathing: Secondary | ICD-10-CM

## 2016-11-11 DIAGNOSIS — R7989 Other specified abnormal findings of blood chemistry: Secondary | ICD-10-CM

## 2016-11-11 LAB — D-DIMER, QUANTITATIVE (NOT AT ARMC): D DIMER QUANT: 2.37 ug{FEU}/mL — AB (ref <0.50)

## 2016-11-12 ENCOUNTER — Other Ambulatory Visit: Payer: Self-pay | Admitting: Primary Care

## 2016-11-12 ENCOUNTER — Ambulatory Visit (INDEPENDENT_AMBULATORY_CARE_PROVIDER_SITE_OTHER)
Admission: RE | Admit: 2016-11-12 | Discharge: 2016-11-12 | Disposition: A | Discharge status: Home / Self Care | Payer: BLUE CROSS/BLUE SHIELD | Source: Ambulatory Visit | Attending: Primary Care | Admitting: Primary Care

## 2016-11-12 DIAGNOSIS — I2699 Other pulmonary embolism without acute cor pulmonale: Secondary | ICD-10-CM

## 2016-11-12 DIAGNOSIS — R7989 Other specified abnormal findings of blood chemistry: Secondary | ICD-10-CM

## 2016-11-12 DIAGNOSIS — R071 Chest pain on breathing: Secondary | ICD-10-CM | POA: Diagnosis not present

## 2016-11-12 MED ORDER — APIXABAN 5 MG PO TABS: ORAL_TABLET | ORAL | 0 refills | Status: DC

## 2016-11-12 MED ORDER — IOPAMIDOL (ISOVUE-370) INJECTION 76%
80.0000 mL | Freq: Once | INTRAVENOUS | Status: AC | PRN
  Administered 2016-11-12: 80 mL via INTRAVENOUS

## 2016-11-16 ENCOUNTER — Telehealth: Payer: Self-pay | Admitting: Internal Medicine

## 2016-11-16 NOTE — Telephone Encounter (Signed)
I can see him anytime for an annual exam or if he has questions/concerns about the PE. Otherwise, he should follow up with Hematology

## 2016-11-16 NOTE — Telephone Encounter (Signed)
I spoke to Isaiah Garza and she requested to speak to pt---[facility]

## 2016-11-16 NOTE — Telephone Encounter (Signed)
Pt would like return call about labs and what to do next  - please call [phone]

## 2016-11-16 NOTE — Telephone Encounter (Signed)
I called patient Friday last week and explained everything in detail. Please notify patient that he is to take the medication as discussed and that he will be following with a hematologist (blood doctor) soon for further testing. Will send this to PCP who will determine next PCP office visit.

## 2016-11-17 ENCOUNTER — Encounter (HOSPITAL_COMMUNITY): Payer: Self-pay | Admitting: Psychiatry

## 2016-11-17 ENCOUNTER — Ambulatory Visit (INDEPENDENT_AMBULATORY_CARE_PROVIDER_SITE_OTHER): Payer: BLUE CROSS/BLUE SHIELD | Admitting: Psychiatry

## 2016-11-17 VITALS — BP 118/70 | HR 83 | Ht 75.0 in | Wt 166.6 lb

## 2016-11-17 DIAGNOSIS — F319 Bipolar disorder, unspecified: Secondary | ICD-10-CM

## 2016-11-17 DIAGNOSIS — Z79899 Other long term (current) drug therapy: Secondary | ICD-10-CM | POA: Diagnosis not present

## 2016-11-17 DIAGNOSIS — Z818 Family history of other mental and behavioral disorders: Secondary | ICD-10-CM | POA: Diagnosis not present

## 2016-11-17 DIAGNOSIS — F1290 Cannabis use, unspecified, uncomplicated: Secondary | ICD-10-CM | POA: Diagnosis not present

## 2016-11-17 DIAGNOSIS — Z87891 Personal history of nicotine dependence: Secondary | ICD-10-CM

## 2016-11-17 DIAGNOSIS — Z811 Family history of alcohol abuse and dependence: Secondary | ICD-10-CM | POA: Diagnosis not present

## 2016-11-17 DIAGNOSIS — Z810 Family history of intellectual disabilities: Secondary | ICD-10-CM | POA: Diagnosis not present

## 2016-11-17 MED ORDER — PALIPERIDONE ER 1.5 MG PO TB24: 1.5000 mg | ORAL_TABLET | Freq: Every day | ORAL | 0 refills | Status: DC

## 2016-11-17 MED ORDER — LURASIDONE HCL 40 MG PO TABS: 40.0000 mg | ORAL_TABLET | Freq: Every day | ORAL | 0 refills | Status: DC

## 2016-11-17 NOTE — Patient Instructions (Signed)
Increase Latuda to 40 mg daily  Decrease Invega to 3 mg daily for 1 week (2 tablets), then decrease to 1.5 mg daily for 1 week (1 tablet)  Come back in 2 weeks

## 2016-11-17 NOTE — Progress Notes (Signed)
BH MD/PA/NP OP Progress Note  11/17/2016 4:06 PM Isaiah Garza  MRN:  [id number]  Chief Complaint:  still having trouble thinking but its better, bipolar med management Subjective:   Isaiah Garza presents today for bipolar disorder med management follow-up. He reports that he has tolerated the initiation of Latuda 20 mg daily without any issue. He reports that he feels like he can think of little bit better, and he's been able to actually read some books and enjoys them. He reports that he's been doing some mindfulness exercises and has been able to have the energy and motivation to do that. He denies any worsening of his mood, and reports that in fact he has not been thinking about death or dying at all this past week or 2. He reports that he is very happy to be going down on the dose of paliperidone, and has been taking 6 mg every 36 hours, since his insurance company would not cover three 1.5 mg tablets (4.5 mg daily).  He reports that he has been able to sleep consistently every night for about 7-8 hours. He reports that he has gotten good feedback from his mom, and he has not had any racing thoughts or symptoms of mania.  He shares that he did have pulmonary embolism this past week, which was discovered when he went to his doctor and was complaining of chest tightness and difficulty breathing.  The pulmonary embolism is small, but he has been started on blood thinner medication for this, and is following up with hematology for further assessment.  Visit Diagnosis:     ICD-9-CM ICD-10-CM   1. Bipolar I disorder (HCC) 296.7 F31.9 lurasidone (LATUDA) 40 MG TABS tablet     Paliperidone 1.5 MG TB24     DISCONTINUED: Paliperidone 1.5 MG TB24    Past Psychiatric History: See intake H&P for full details. Reviewed, with no updates at this time.   Past Medical History:  Past Medical History:  Diagnosis Date  . ADD (attention deficit disorder)   . Allergy   . Asthma   . Bipolar 1 disorder (HCC)     No past surgical history on file.  Family Psychiatric History: See intake H&P for full details. Reviewed, with no updates at this time.   Family History:  Family History  Problem Relation Age of Onset  . Cancer Father     testicular  . ADD / ADHD Father   . Depression Father   . Alcohol abuse Mother   . Depression Mother   . Bipolar disorder Maternal Grandmother   . Dementia Maternal Grandmother   . Bipolar disorder Maternal Aunt   . Bipolar disorder Cousin   . Diabetes Neg Hx   . Heart disease Neg Hx   . Early death Neg Hx   . Stroke Neg Hx     Social History:  Social History   Social History  . Marital status: Single    Spouse name: N/A  . Number of children: N/A  . Years of education: N/A   Occupational History  . cook    Social History Main Topics  . Smoking status: Former Games developer  . Smokeless tobacco: Never Used  . Alcohol use 1.2 oz/week    2 Cans of beer per week     Comment: social - only about 3 drinks  . Drug use: Yes    Types: Marijuana     Comment: periodic use of marijuana  . Sexual activity: Yes  Partners: Female    Birth control/ protection: Condom   Other Topics Concern  . None   Social History Narrative   Exercise - assorted (walking, running, cycling, and cardio)    Allergies:  Allergies  Allergen Reactions  . Other     Pt states that all medicines are poison antibiotics, ssri's etc. Could not get a concrete answer on this     Metabolic Disorder Labs: Lab Results  Component Value Date   HGBA1C 5.3 09/18/2016   MPG 105 09/18/2016   No results found for: PROLACTIN Lab Results  Component Value Date   CHOL 109 09/18/2016   TRIG 95 09/18/2016   HDL 43 09/18/2016   CHOLHDL 2.5 09/18/2016   VLDL 19 09/18/2016   LDLCALC 47 09/18/2016   LDLCALC 79 01/16/2015     Current Medications: Current Outpatient Prescriptions  Medication Sig Dispense Refill  . apixaban (ELIQUIS) 5 MG TABS tablet Take 2 tablets by mouth twice daily for  7 days, then take 1 tablet by mouth twice daily thereafter. 190 tablet 0  . lurasidone (LATUDA) 40 MG TABS tablet Take 1 tablet (40 mg total) by mouth daily with breakfast. 30 tablet 0  . Omega-3 Fatty Acids (FISH OIL) 1000 MG CAPS Take 1 capsule by mouth 2 (two) times daily.    . Oxcarbazepine (TRILEPTAL) 300 MG tablet Take 1 tablet (300 mg total) by mouth 2 (two) times daily. 60 tablet 1  . Paliperidone 1.5 MG TB24 Take 1 tablet (1.5 mg total) by mouth at bedtime. 30 tablet 0  . temazepam (RESTORIL) 15 MG capsule Take 1 capsule (15 mg total) by mouth at bedtime. 30 capsule 1   No current facility-administered medications for this visit.     Neurologic: Headache: Negative Seizure: Negative Paresthesias: Negative  Musculoskeletal: Strength & Muscle Tone: within normal limits Gait & Station: normal Patient leans: N/A  Psychiatric Specialty Exam: Review of Systems  Constitutional: Negative.   HENT: Negative.   Respiratory: Positive for cough.   Gastrointestinal: Negative.   Genitourinary: Negative.   Neurological: Negative.   Psychiatric/Behavioral: Positive for depression.    Blood pressure 118/70, pulse 83, height 6\' 3"  (1.905 m), weight 75.6 kg (166 lb 9.6 oz).Body mass index is 20.82 kg/m.  General Appearance: Casual  Eye Contact:  Good  Speech:  Clear and Coherent  Volume:  Normal  Mood:  Dysphoric  Affect:  Congruent  Thought Process:  Goal Directed  Orientation:  Full (Time, Place, and Person)  Thought Content: Logical   Suicidal Thoughts:  No  Homicidal Thoughts:  No  Memory:  Immediate;   Good  Judgement:  Good  Insight:  Fair  Psychomotor Activity:  Normal  Concentration:  Concentration: Fair and Attention Span: Fair  Recall:  NA  Fund of Knowledge: Good  Language: Good  Akathisia:  Negative  Handed:  Right  AIMS (if indicated):  n/a  Assets:  Communication Skills Desire for Improvement Financial Resources/Insurance Housing Intimacy Leisure  Time Physical Health Resilience Social Support Talents/Skills Transportation Vocational/Educational  ADL's:  Intact  Cognition: WNL  Sleep:  8-9 hours nightly   Treatment Plan Summary: Isaiah Garza is a 26 year old male with bipolar disorder, who presents for med management follow-up. We're working on cross tapering from in [place] to [place], given poor tolerance of an [place], and cognitive dulling. Isaiah Garza has superior evidence for a reduction in cognitive side effects, and as he is currently contending with depressive symptoms, this may ultimately be more effective. He remains  on antiepileptic for mood stabilizer. He continues Restoril at night for sleep. Today he presents with slight improvement in his mood, denies any safety issues, and agrees to continue the cross taper. He is unfortunately suffered a small pulmonary embolism, and is now taking a blood thinner for this issue, and will be working with hematology on an appropriate workup.  Bipolar I disorder - Continue Trileptal 300 mg BID - Invega reduced to 3 mg daily for 1 week, then 1.5 mg daily for 1 week - Latuda increased to 40 mg daily - Continue fish oil - Follow-up in 4 weeks  Burnard LeighAlexander Arya Eksir, MD 11/17/2016, 4:06 PM

## 2016-12-02 ENCOUNTER — Ambulatory Visit (INDEPENDENT_AMBULATORY_CARE_PROVIDER_SITE_OTHER): Payer: BLUE CROSS/BLUE SHIELD | Admitting: Psychiatry

## 2016-12-02 ENCOUNTER — Encounter (HOSPITAL_COMMUNITY): Payer: Self-pay | Admitting: Psychiatry

## 2016-12-02 DIAGNOSIS — F1290 Cannabis use, unspecified, uncomplicated: Secondary | ICD-10-CM

## 2016-12-02 DIAGNOSIS — Z87891 Personal history of nicotine dependence: Secondary | ICD-10-CM | POA: Diagnosis not present

## 2016-12-02 DIAGNOSIS — Z810 Family history of intellectual disabilities: Secondary | ICD-10-CM | POA: Diagnosis not present

## 2016-12-02 DIAGNOSIS — Z79899 Other long term (current) drug therapy: Secondary | ICD-10-CM | POA: Diagnosis not present

## 2016-12-02 DIAGNOSIS — Z811 Family history of alcohol abuse and dependence: Secondary | ICD-10-CM

## 2016-12-02 DIAGNOSIS — Z818 Family history of other mental and behavioral disorders: Secondary | ICD-10-CM | POA: Diagnosis not present

## 2016-12-02 DIAGNOSIS — F319 Bipolar disorder, unspecified: Secondary | ICD-10-CM | POA: Diagnosis not present

## 2016-12-02 MED ORDER — OXCARBAZEPINE 300 MG PO TABS: 450.0000 mg | ORAL_TABLET | Freq: Every day | ORAL | 1 refills | Status: DC

## 2016-12-02 MED ORDER — LURASIDONE HCL 60 MG PO TABS: 60.0000 mg | ORAL_TABLET | Freq: Every day | ORAL | 0 refills | Status: DC

## 2016-12-02 NOTE — Progress Notes (Signed)
BH MD/PA/NP OP Progress Note  12/02/2016 3:59 PM Isaiah Garza  MRN:  [id number]  Chief Complaint:  low energy still, but better Subjective:   Isaiah Garza reports that his mood has definitely been better, but he still struggles with low energy, some anhedonia, some difficulty with enjoying activities, still some cognitive slowing. He reports that Isaiah Garza has been much better tolerated than the Invega. He took his last dose of an Invega last night. He reports that in the morning he still continues to struggle with early morning depression, and has the thought of "why bother getting out of bed". He reports that he has been able to hang out with friends the past week, and was able to enjoy this. Today his mood and energy are a bit brighter, and he thinks that the lower dose of the Invega has definitely helped.  He denies any suicidal thoughts. He reports that he has not used any marijuana in the past 4 weeks, and even in situations where he was offered marijuana by friends, he was able to say no.  I spent time with the patient plotting his efforts, and aligning with him on the goals of increasing energy, and continued titration of Latuda to help with his depression.  He agrees to follow-up in 4 weeks. We also discussed proceeding with the taper of carbamazepine to daily at bedtime only.  He reports that he has continued to sleep well every night.  Visit Diagnosis:     ICD-9-CM ICD-10-CM   1. Bipolar I disorder (HCC) 296.7 F31.9 Lurasidone HCl 60 MG TABS    Past Psychiatric History: See intake H&P for full details. Reviewed, with no updates at this time.   Past Medical History:  Past Medical History:  Diagnosis Date  . ADD (attention deficit disorder)   . Allergy   . Asthma   . Bipolar 1 disorder (HCC)    History reviewed. No pertinent surgical history.  Family Psychiatric History: See intake H&P for full details. Reviewed, with no updates at this time.   Family History:  Family History   Problem Relation Age of Onset  . Cancer Father     testicular  . ADD / ADHD Father   . Depression Father   . Alcohol abuse Mother   . Depression Mother   . Bipolar disorder Maternal Grandmother   . Dementia Maternal Grandmother   . Bipolar disorder Maternal Aunt   . Bipolar disorder Cousin   . Diabetes Neg Hx   . Heart disease Neg Hx   . Early death Neg Hx   . Stroke Neg Hx     Social History:  Social History   Social History  . Marital status: Single    Spouse name: N/A  . Number of children: N/A  . Years of education: N/A   Occupational History  . cook    Social History Main Topics  . Smoking status: Former Games developermoker  . Smokeless tobacco: Never Used  . Alcohol use 1.2 oz/week    2 Cans of beer per week     Comment: social - only about 3 drinks  . Drug use: Yes    Types: Marijuana     Comment: periodic use of marijuana  . Sexual activity: Yes    Partners: Female    Birth control/ protection: Condom   Other Topics Concern  . None   Social History Narrative   Exercise - assorted (walking, running, cycling, and cardio)    Allergies:  Allergies  Allergen Reactions  . Other     Pt states that all medicines are poison antibiotics, ssri's etc. Could not get a concrete answer on this     Metabolic Disorder Labs: Lab Results  Component Value Date   HGBA1C 5.3 09/18/2016   MPG 105 09/18/2016   No results found for: PROLACTIN Lab Results  Component Value Date   CHOL 109 09/18/2016   TRIG 95 09/18/2016   HDL 43 09/18/2016   CHOLHDL 2.5 09/18/2016   VLDL 19 09/18/2016   LDLCALC 47 09/18/2016   LDLCALC 79 01/16/2015     Current Medications: Current Outpatient Prescriptions  Medication Sig Dispense Refill  . apixaban (ELIQUIS) 5 MG TABS tablet Take 2 tablets by mouth twice daily for 7 days, then take 1 tablet by mouth twice daily thereafter. 190 tablet 0  . Lurasidone HCl 60 MG TABS Take 1 tablet (60 mg total) by mouth daily with breakfast. 90 tablet 0   . Omega-3 Fatty Acids (FISH OIL) 1000 MG CAPS Take 1 capsule by mouth 2 (two) times daily.    . Oxcarbazepine (TRILEPTAL) 300 MG tablet Take 1.5 tablets (450 mg total) by mouth at bedtime. 60 tablet 1  . temazepam (RESTORIL) 15 MG capsule Take 1 capsule (15 mg total) by mouth at bedtime. 30 capsule 1   No current facility-administered medications for this visit.     Neurologic: Headache: Negative Seizure: Negative Paresthesias: Negative  Musculoskeletal: Strength & Muscle Tone: within normal limits Gait & Station: normal Patient leans: N/A  Psychiatric Specialty Exam: Review of Systems  Constitutional: Negative.   HENT: Negative.   Gastrointestinal: Negative.   Genitourinary: Negative.   Neurological: Negative.   Psychiatric/Behavioral: Negative for suicidal ideas.    Blood pressure 118/68, pulse 72, height 6\' 3"  (1.905 m), weight 75.9 kg (167 lb 6.4 oz).Body mass index is 20.92 kg/m.  General Appearance: Casual  Eye Contact:  Good  Speech:  Clear and Coherent  Volume:  Normal  Mood:  Euthymic  Affect:  Congruent  Thought Process:  Goal Directed  Orientation:  Full (Time, Place, and Person)  Thought Content: Logical   Suicidal Thoughts:  No  Homicidal Thoughts:  No  Memory:  Immediate;   Good  Judgement:  Good  Insight:  Fair  Psychomotor Activity:  Normal  Concentration:  Concentration: Fair and Attention Span: Fair  Recall:  NA  Fund of Knowledge: Good  Language: Good  Akathisia:  Negative  Handed:  Right  AIMS (if indicated):  n/a  Assets:  Communication Skills Desire for Improvement Financial Resources/Insurance Housing Intimacy Leisure Time Physical Health Resilience Social Support Talents/Skills Transportation Vocational/Educational  ADL's:  Intact  Cognition: WNL  Sleep:  8-9 hours nightly   Treatment Plan Summary:  Isaiah Garza is a 26 year old male with bipolar disorder, who presents for med management follow-up.  We recently cross  tapered from paliperidone to [facility] which he has tolerated well, and with benefit.  We will taper carbamazepine to daily at bedtime only, and continue temazepam at the current dose. His mood appears stable, without any significant elevations, lability, mania. We are titrating with the goal of helping him with his daytime energy, maintaining a healthy sleep schedule, and maintaining stable mood. We'll follow-up in 4 weeks. The patient has done an excellent job in discontinuing his use of marijuana for the past 1-2 months.  Bipolar I disorder - Trileptal to 450 mg and make QHS only - Invega discontinued; last dose of 1.5 mg yesterday evening -  Latuda increased to 60 mg daily - Continue fish oil daily - Follow-up in 4 weeks  Burnard Leigh, MD 12/02/2016, 3:59 PM

## 2016-12-09 ENCOUNTER — Encounter (HOSPITAL_COMMUNITY): Payer: Self-pay | Admitting: Psychiatry

## 2016-12-14 ENCOUNTER — Encounter (HOSPITAL_COMMUNITY): Payer: Self-pay | Admitting: Psychiatry

## 2016-12-14 NOTE — Telephone Encounter (Signed)
I talked to him today. Can we book him for tomorrow at 1 PM. Thank you!

## 2016-12-15 ENCOUNTER — Ambulatory Visit (INDEPENDENT_AMBULATORY_CARE_PROVIDER_SITE_OTHER): Payer: BLUE CROSS/BLUE SHIELD | Admitting: Psychiatry

## 2016-12-15 ENCOUNTER — Encounter (HOSPITAL_COMMUNITY): Payer: Self-pay | Admitting: Psychiatry

## 2016-12-15 VITALS — BP 118/68 | HR 77 | Ht 75.0 in | Wt 162.0 lb

## 2016-12-15 DIAGNOSIS — Z813 Family history of other psychoactive substance abuse and dependence: Secondary | ICD-10-CM | POA: Diagnosis not present

## 2016-12-15 DIAGNOSIS — Z810 Family history of intellectual disabilities: Secondary | ICD-10-CM

## 2016-12-15 DIAGNOSIS — Z818 Family history of other mental and behavioral disorders: Secondary | ICD-10-CM

## 2016-12-15 DIAGNOSIS — F319 Bipolar disorder, unspecified: Secondary | ICD-10-CM

## 2016-12-15 MED ORDER — LAMOTRIGINE 25 MG PO TABS: 25.0000 mg | ORAL_TABLET | Freq: Every day | ORAL | 1 refills | Status: DC

## 2016-12-15 MED ORDER — OXCARBAZEPINE 300 MG PO TABS: 300.0000 mg | ORAL_TABLET | Freq: Every day | ORAL | 1 refills | Status: DC

## 2016-12-15 NOTE — Progress Notes (Signed)
BH MD/PA/NP OP Progress Note  12/15/2016 1:42 PM Isaiah NeighborsCharles A Garza  MRN:  [id number]  Chief Complaint:  some depressed mood, negative, pessimistic thoughts Subjective:   Isaiah Neighborsharles A Pfiffner zest today for psychiatric med management follow-up. He requested to come earlier than his scheduled appointment, as he has been feeling a little bit more pessimistic and negative recently. He continues to be down on himself that he is not able to do as much reading, writing, and cognitive tasks that he was able to do before. He reports that [name] has been much better tolerated for his cognition and is much less sedating to him. He reports that he sleeps very well at night. He reports that he thinks that Trileptal may be contributing to some cognitive dulling, and wonders about coming off of this. He reports that he had been on Lamictal in the past, and this had been very effective for his depression and mood lability. However he had been tapered off of it, due to his goals of going to pilot school at the time. His goals of now changed, so he is able to be on Lamictal once again.   We spent time reviewing the risks and benefits of Lamictal, including the risk of Stevens-Johnson syndrome. He agrees to restart, and we also agreed to taper Trileptal, so that he is only on one AED.  He continues to take Latuda 60 mg. He continues to take Restoril at night for sleep. I cautioned the patient against some of the activating effects of Lamictal, so we agreed to titrate slowly. He will let writer know if he has any issues in the next couple weeks. He is scheduled to see me on April 12 for a follow-up med visit.  He continues to engage in therapy with Vickey SagesBarbara Ferrin, who can be reached at [phone].  He reports that his mom has no other significant concerns to bring up. He brought a list of questions, and we discussed all those items. He reports mom was a little bit worried about him being down and depressed the past few days.  Overall, he feels like he's trending towards improvement.  Visit Diagnosis:    ICD-9-CM ICD-10-CM   1. Bipolar I disorder (HCC) 296.7 F31.9     Past Psychiatric History: See intake H&P for full details. Reviewed, with no updates at this time.  Past Medical History:  Past Medical History:  Diagnosis Date  . ADD (attention deficit disorder)   . Allergy   . Asthma   . Bipolar 1 disorder (HCC)    No past surgical history on file.  Family Psychiatric History: See intake H&P for full details. Reviewed, with no updates at this time.   Family History:  Family History  Problem Relation Age of Onset  . Cancer Father     testicular  . ADD / ADHD Father   . Depression Father   . Alcohol abuse Mother   . Depression Mother   . Bipolar disorder Maternal Grandmother   . Dementia Maternal Grandmother   . Bipolar disorder Maternal Aunt   . Bipolar disorder Cousin   . Diabetes Neg Hx   . Heart disease Neg Hx   . Early death Neg Hx   . Stroke Neg Hx     Social History:  Social History   Social History  . Marital status: Single    Spouse name: N/A  . Number of children: N/A  . Years of education: N/A   Occupational History  . cook  Social History Main Topics  . Smoking status: Former Games developer  . Smokeless tobacco: Never Used  . Alcohol use 1.2 oz/week    2 Cans of beer per week     Comment: social - only about 3 drinks  . Drug use: Yes    Types: Marijuana     Comment: periodic use of marijuana  . Sexual activity: Yes    Partners: Female    Birth control/ protection: Condom   Other Topics Concern  . None   Social History Narrative   Exercise - assorted (walking, running, cycling, and cardio)    Allergies:  No Known Allergies  Metabolic Disorder Labs: Lab Results  Component Value Date   HGBA1C 5.3 09/18/2016   MPG 105 09/18/2016   No results found for: PROLACTIN Lab Results  Component Value Date   CHOL 109 09/18/2016   TRIG 95 09/18/2016   HDL 43  09/18/2016   CHOLHDL 2.5 09/18/2016   VLDL 19 09/18/2016   LDLCALC 47 09/18/2016   LDLCALC 79 01/16/2015     Current Medications: Current Outpatient Prescriptions  Medication Sig Dispense Refill  . apixaban (ELIQUIS) 5 MG TABS tablet Take 2 tablets by mouth twice daily for 7 days, then take 1 tablet by mouth twice daily thereafter. 190 tablet 0  . lamoTRIgine (LAMICTAL) 25 MG tablet Take 1 tablet (25 mg total) by mouth daily. 90 tablet 1  . Lurasidone HCl 60 MG TABS Take 1 tablet (60 mg total) by mouth daily with breakfast. 90 tablet 0  . Omega-3 Fatty Acids (FISH OIL) 1000 MG CAPS Take 1 capsule by mouth 2 (two) times daily.    . Oxcarbazepine (TRILEPTAL) 300 MG tablet Take 1 tablet (300 mg total) by mouth at bedtime. 60 tablet 1  . temazepam (RESTORIL) 15 MG capsule Take 1 capsule (15 mg total) by mouth at bedtime. 30 capsule 1   No current facility-administered medications for this visit.     Neurologic: Headache: Negative Seizure: Negative Paresthesias: Negative  Musculoskeletal: Strength & Muscle Tone: within normal limits Gait & Station: normal Patient leans: N/A  Psychiatric Specialty Exam: Review of Systems  Constitutional: Negative.   HENT: Negative.   Gastrointestinal: Negative.   Genitourinary: Negative.   Neurological: Negative.   Psychiatric/Behavioral: Negative for suicidal ideas.    Blood pressure 118/68, pulse 77, height 6\' 3"  (1.905 m), weight 162 lb (73.5 kg).Body mass index is 20.25 kg/m.  General Appearance: Casual and Fairly Groomed  Eye Contact:  Good  Speech:  Clear and Coherent  Volume:  Normal  Mood:  Dysphoric and pessimistic  Affect:  Congruent  Thought Process:  Goal Directed  Orientation:  Full (Time, Place, and Person)  Thought Content: Logical   Suicidal Thoughts:  No  Homicidal Thoughts:  No  Memory:  Immediate;   Good  Judgement:  Good  Insight:  Fair  Psychomotor Activity:  Normal  Concentration:  Concentration: Fair and  Attention Span: Fair  Recall:  NA  Fund of Knowledge: Good  Language: Good  Akathisia:  Negative  Handed:  Right  AIMS (if indicated):  n/a  Assets:  Communication Skills Desire for Improvement Financial Resources/Insurance Housing Intimacy Leisure Time Physical Health Resilience Social Support Talents/Skills Transportation Vocational/Educational  ADL's:  Intact  Cognition: WNL  Sleep:  8-9 hours nightly   Treatment Plan Summary:  Isaiah Garza is a 26 year old male with bipolar disorder, who presents for follow-up. He has previously been tried on risperidone, Palliperidone, and olanzapine.  Kasandra Knudsen has  been best tolerated.  We recently cross tapered from paliperidone to [facility] which he has tolerated well, and with benefit.  This is been much less cognitively dulling for him, and he has been able to get back into reading books, and has had the energy to start going to the gym.    He continues to have some pessimistic and negative mood, so we discussed initiating and titrating Lamictal for depression. I anticipate that we'll be able to taper him off of Trileptal. Lamictal and Trileptal do not have any significant pharmacologic interaction, but having both medications on board is associated with increased risk of adverse medication reactions. I educated the patient on Stevens-Johnson syndrome, and cautioned him to seek emergency care if he notices such a rash.  Bipolar I disorder - Trileptal tapered to 300 mg QHS only for 2 weeks, then 150 mg qhs thereafter - Latuda increased to 60 mg daily - Initiate Lamictal 25 mg daily for 1 week, then increase to 50 mg daily for 1 week, then increase to 100 mg daily - Follow-up with writer on April 12 - Continue fish oil daily - Continue in therapy with Shanon Rosser [phone]) - Continue to encourage gym and exercise participation - Encourage future oriented goals towards schooling and career  Burnard Leigh, MD 12/15/2016, 1:42  PM

## 2016-12-15 NOTE — Patient Instructions (Addendum)
Decrease Trileptal to 300 mg (1 tablet) at night Continue Latuda 60 mg in the morning Continue Restoril 15 mg at night  Start lamictal 25 mg in the morning for 1 week Then increase to Lamictal 50 mg in the morning for 1 week Then increase to Lamictal 100 mg in the morning thereafter  Come back and see me on 4/12

## 2016-12-30 ENCOUNTER — Encounter (HOSPITAL_COMMUNITY): Payer: Self-pay | Admitting: Psychiatry

## 2016-12-30 ENCOUNTER — Ambulatory Visit (INDEPENDENT_AMBULATORY_CARE_PROVIDER_SITE_OTHER): Payer: BLUE CROSS/BLUE SHIELD | Admitting: Psychiatry

## 2016-12-30 VITALS — BP 118/72 | HR 75 | Ht 75.0 in | Wt 162.6 lb

## 2016-12-30 DIAGNOSIS — Z818 Family history of other mental and behavioral disorders: Secondary | ICD-10-CM

## 2016-12-30 DIAGNOSIS — Z810 Family history of intellectual disabilities: Secondary | ICD-10-CM

## 2016-12-30 DIAGNOSIS — Z87891 Personal history of nicotine dependence: Secondary | ICD-10-CM

## 2016-12-30 DIAGNOSIS — F319 Bipolar disorder, unspecified: Secondary | ICD-10-CM | POA: Diagnosis not present

## 2016-12-30 DIAGNOSIS — Z811 Family history of alcohol abuse and dependence: Secondary | ICD-10-CM | POA: Diagnosis not present

## 2016-12-30 MED ORDER — TEMAZEPAM 15 MG PO CAPS: 15.0000 mg | ORAL_CAPSULE | Freq: Every day | ORAL | 1 refills | Status: DC

## 2016-12-30 MED ORDER — BUPROPION HCL ER (XL) 150 MG PO TB24: 150.0000 mg | ORAL_TABLET | ORAL | 1 refills | Status: DC

## 2016-12-30 MED ORDER — LAMOTRIGINE 200 MG PO TABS: 200.0000 mg | ORAL_TABLET | Freq: Every day | ORAL | 1 refills | Status: DC

## 2016-12-30 NOTE — Progress Notes (Signed)
BH MD/PA/NP OP Progress Note  12/30/2016 2:50 PM Isaiah Garza  MRN:  [id number]  Chief Complaint:  Chief Complaint    Follow-up     Subjective:   Isaiah Garza presents for bipolar depression, continued low energy, and poor self-confidence. Completed PHQ9 and he scored a 19. He continues to have low motivation to engage in activities. At the last visit we started and titrated Lamictal, and he has tolerated the increase to 100 mg daily. We discussed doubling to 200 mg. He has not had any skin rash. He continues Latuda 60 mg. He continues to sleep well at night, but reports that he is oversleeping, approximately 15 hours every day. He goes to bed around 10, and then might wake up the next morning at 12 or 1 in the afternoon.  He denies any suicidal thoughts. He continues to want his energy and mood to be better severity can return to college in the fall. He has thought about starting to go on dates, but feels like he has low self-esteem because he feels like it's hard to think and engage in conversations. Staff feel like Trileptal makes him feel tired and sedated, and wants to discontinue this.  We agreed to discontinue Trileptal 300 mg, and further increase Lamictal to 200. In addition we will initiate 150 mg Wellbutrin XL, and we reviewed the risks and benefits of this. He has no history of epilepsy or seizures. I educated him on the expected benefits, and reasons to discontinue the medication. Encouraged him to continue using Restoril at night for sleep, and if he has any sleep disturbance, it is okay to use 2 tablets of Restoril.  He agrees to follow-up in 4 weeks.  Visit Diagnosis:    ICD-9-CM ICD-10-CM   1. Bipolar I disorder (HCC) 296.7 F31.9 lamoTRIgine (LAMICTAL) 200 MG tablet     temazepam (RESTORIL) 15 MG capsule     buPROPion (WELLBUTRIN XL) 150 MG 24 hr tablet    Past Psychiatric History: See intake H&P for full details. Reviewed, with no updates at this time.  Past Medical  History:  Past Medical History:  Diagnosis Date  . ADD (attention deficit disorder)   . Allergy   . Asthma   . Bipolar 1 disorder (HCC)    No past surgical history on file.  Family Psychiatric History: See intake H&P for full details. Reviewed, with no updates at this time.   Family History:  Family History  Problem Relation Age of Onset  . Cancer Father     testicular  . ADD / ADHD Father   . Depression Father   . Alcohol abuse Mother   . Depression Mother   . Bipolar disorder Maternal Grandmother   . Dementia Maternal Grandmother   . Bipolar disorder Maternal Aunt   . Bipolar disorder Cousin   . Diabetes Neg Hx   . Heart disease Neg Hx   . Early death Neg Hx   . Stroke Neg Hx     Social History:  Social History   Social History  . Marital status: Single    Spouse name: N/A  . Number of children: N/A  . Years of education: N/A   Occupational History  . cook    Social History Main Topics  . Smoking status: Former Games developer  . Smokeless tobacco: Never Used  . Alcohol use 1.2 oz/week    2 Cans of beer per week     Comment: social - only about 3 drinks  .  Drug use: No     Comment: periodic use of marijuana  . Sexual activity: Not Currently    Partners: Female    Birth control/ protection: Condom   Other Topics Concern  . None   Social History Narrative   Exercise - assorted (walking, running, cycling, and cardio)    Allergies:  No Known Allergies  Metabolic Disorder Labs: Lab Results  Component Value Date   HGBA1C 5.3 09/18/2016   MPG 105 09/18/2016   No results found for: PROLACTIN Lab Results  Component Value Date   CHOL 109 09/18/2016   TRIG 95 09/18/2016   HDL 43 09/18/2016   CHOLHDL 2.5 09/18/2016   VLDL 19 09/18/2016   LDLCALC 47 09/18/2016   LDLCALC 79 01/16/2015     Current Medications: Current Outpatient Prescriptions  Medication Sig Dispense Refill  . apixaban (ELIQUIS) 5 MG TABS tablet Take 2 tablets by mouth twice daily for 7  days, then take 1 tablet by mouth twice daily thereafter. 190 tablet 0  . lamoTRIgine (LAMICTAL) 200 MG tablet Take 1 tablet (200 mg total) by mouth daily. 90 tablet 1  . Lurasidone HCl 60 MG TABS Take 1 tablet (60 mg total) by mouth daily with breakfast. 90 tablet 0  . Omega-3 Fatty Acids (FISH OIL) 1000 MG CAPS Take 1 capsule by mouth 2 (two) times daily.    . temazepam (RESTORIL) 15 MG capsule Take 1 capsule (15 mg total) by mouth at bedtime. 30 capsule 1  . buPROPion (WELLBUTRIN XL) 150 MG 24 hr tablet Take 1 tablet (150 mg total) by mouth every morning. 90 tablet 1   No current facility-administered medications for this visit.     Neurologic: Headache: Negative Seizure: Negative Paresthesias: Negative  Musculoskeletal: Strength & Muscle Tone: within normal limits Gait & Station: normal Patient leans: N/A  Psychiatric Specialty Exam: Review of Systems  Constitutional: Negative.   HENT: Negative.   Respiratory: Negative.   Gastrointestinal: Negative.   Genitourinary: Negative.   Neurological: Negative.   Psychiatric/Behavioral: Positive for depression. Negative for hallucinations, substance abuse and suicidal ideas. The patient is not nervous/anxious and does not have insomnia.     Blood pressure 118/72, pulse 75, height  (1.905 m), weight 162 lb 9.6 oz (73.8 kg).Body mass index is 20.32 kg/m.  General Appearance: Well Groomed  Eye Contact:  Good  Speech:  Clear and Coherent  Volume:  Normal  Mood:  Dysphoric and Continued pessimism and poor self-esteem  Affect:  Congruent  Thought Process:  Goal Directed  Orientation:  Full (Time, Place, and Person)  Thought Content: Logical   Suicidal Thoughts:  No  Homicidal Thoughts:  No  Memory:  Immediate;   Good  Judgement:  Good  Insight:  Fair  Psychomotor Activity:  Normal  Concentration:  Concentration: Fair and Attention Span: Fair  Recall:  NA  Fund of Knowledge: Good  Language: Good  Akathisia:  Negative   Handed:  Right  AIMS (if indicated):  n/a  Assets:  Communication Skills Desire for Improvement Financial Resources/Insurance Housing Intimacy Leisure Time Physical Health Resilience Social Support Talents/Skills Transportation Vocational/Educational  ADL's:  Intact  Cognition: WNL  Sleep:  15 hours nightly   Treatment Plan Summary: Isaiah Garza is a 26 year old male with bipolar disorder, who presents for follow-up.  He has tolerated Latuda well for bipolar depression. We are titrating Lamictal to an effective dose for bipolar depression. He continues to struggle with low energy, poor motivation, and cognitive dulling.  PHQ9 today is 19. Will proceed as below and follow up in 4 weeks. He does not have any acute SI or safety issues at this time.  Bipolar I disorder - Trileptal discontinued - Latuda maintained that 60 mg daily - Lamictal increased to 200 mg (new tablet) - Patient is educated on the risk of Stevens-Johnson syndrome and has not had any skin reaction or rash thus far - Initiate Wellbutrin 150 mg XL daily for depression and low energy - Continue Restoril 15-30 mg nightly for sleep - Continue fish oil daily - Patient continues in therapy with Shanon Rosser [phone]) - Follow-up with writer in 4 weeks  Burnard Leigh, MD 12/30/2016, 2:50 PM

## 2016-12-30 NOTE — Patient Instructions (Addendum)
STOP trileptal completely  Increase Lamictal to 200 mg in the morning (this will be a new tablet)  Continue Latuda in the morning  START Wellbutrin 150 mg XL in the morning  Continue Restoril at night

## 2017-01-03 ENCOUNTER — Encounter (HOSPITAL_COMMUNITY): Payer: Self-pay | Admitting: Psychiatry

## 2017-01-10 ENCOUNTER — Encounter (HOSPITAL_COMMUNITY): Payer: Self-pay | Admitting: Psychiatry

## 2017-01-13 ENCOUNTER — Encounter (HOSPITAL_COMMUNITY): Payer: Self-pay | Admitting: Psychiatry

## 2017-01-17 ENCOUNTER — Encounter (HOSPITAL_COMMUNITY): Payer: Self-pay | Admitting: Psychiatry

## 2017-01-18 ENCOUNTER — Encounter (HOSPITAL_COMMUNITY): Payer: Self-pay | Admitting: Psychiatry

## 2017-01-18 ENCOUNTER — Other Ambulatory Visit (HOSPITAL_COMMUNITY): Payer: Self-pay | Admitting: Psychiatry

## 2017-01-18 DIAGNOSIS — F319 Bipolar disorder, unspecified: Secondary | ICD-10-CM

## 2017-01-18 MED ORDER — LURASIDONE HCL 80 MG PO TABS: 80.0000 mg | ORAL_TABLET | Freq: Every day | ORAL | 1 refills | Status: DC

## 2017-01-18 NOTE — Progress Notes (Signed)
Patient with continued depressive symptoms  Continue Wellbutrin 150 mg XL Increase Latuda to 80 mg daily Continue lamictal 200 mg  Continue restoril 15-30 qhs for sleep

## 2017-01-19 ENCOUNTER — Other Ambulatory Visit (HOSPITAL_COMMUNITY): Payer: Self-pay | Admitting: Psychiatry

## 2017-01-19 MED ORDER — HYDROXYZINE PAMOATE 25 MG PO CAPS: 25.0000 mg | ORAL_CAPSULE | Freq: Three times a day (TID) | ORAL | 1 refills | Status: DC | PRN

## 2017-01-25 ENCOUNTER — Encounter (HOSPITAL_COMMUNITY): Payer: Self-pay | Admitting: Psychiatry

## 2017-01-31 ENCOUNTER — Encounter (HOSPITAL_COMMUNITY): Payer: Self-pay | Admitting: Psychiatry

## 2017-02-03 ENCOUNTER — Ambulatory Visit (HOSPITAL_COMMUNITY): Payer: Self-pay | Admitting: Psychiatry

## 2017-02-04 ENCOUNTER — Encounter (HOSPITAL_COMMUNITY): Payer: Self-pay | Admitting: Psychiatry

## 2017-02-04 ENCOUNTER — Ambulatory Visit (INDEPENDENT_AMBULATORY_CARE_PROVIDER_SITE_OTHER): Payer: BLUE CROSS/BLUE SHIELD | Admitting: Psychiatry

## 2017-02-04 ENCOUNTER — Encounter: Payer: Self-pay | Admitting: Internal Medicine

## 2017-02-04 VITALS — BP 122/76 | HR 76 | Ht 75.0 in | Wt 159.0 lb

## 2017-02-04 DIAGNOSIS — Z818 Family history of other mental and behavioral disorders: Secondary | ICD-10-CM

## 2017-02-04 DIAGNOSIS — Z79899 Other long term (current) drug therapy: Secondary | ICD-10-CM | POA: Diagnosis not present

## 2017-02-04 DIAGNOSIS — F319 Bipolar disorder, unspecified: Secondary | ICD-10-CM | POA: Diagnosis not present

## 2017-02-04 DIAGNOSIS — Z810 Family history of intellectual disabilities: Secondary | ICD-10-CM | POA: Diagnosis not present

## 2017-02-04 DIAGNOSIS — Z87891 Personal history of nicotine dependence: Secondary | ICD-10-CM | POA: Diagnosis not present

## 2017-02-04 NOTE — Progress Notes (Signed)
BH MD/PA/NP OP Progress Note  02/04/2017 12:23 PM Isaiah Garza  MRN:  [id number]  Chief Complaint:  Chief Complaint    Follow-up     Subjective:   Isaiah Neighborsharles A Garza presents for ongoing depressive symptoms.  With titration of latuda and wellbutrin, in addition to ongoing treatment with lamictal, he remains quite severely depressed.  I coordinate with his therapist as well.  Reviewed his numerous past medication trials.  Reviewed the trajectory of his bipolar illness.  I suggested we consider ECT for treatment and he was open to this.  We reviewed some of the risks and what a typical course of ECT looks like.  He was agreeable to meet with Dr. Toni Amendlapacs for consultation.  He denies any SI, and denies any significant insomnia.    Depression screen [facility] IHQ 2/9 02/04/2017 12/30/2016 01/16/2015 05/02/2013  Decreased Interest 3 3 0 0  Down, Depressed, Hopeless 3 2 0 0  PHQ - 2 Score 6 5 0 0  Altered sleeping 1 3 - -  Tired, decreased energy 3 3 - -  Change in appetite 2 1 - -  Feeling bad or failure about yourself  3 0 - -  Trouble concentrating 3 3 - -  Moving slowly or fidgety/restless 2 3 - -  Suicidal thoughts 1 1 - -  PHQ-9 Score 21 19 - -    Visit Diagnosis:    ICD-9-CM ICD-10-CM   1. Bipolar I disorder (HCC) 296.7 F31.9     Past Psychiatric History: See intake H&P for full details. Reviewed, with no updates at this time.  Past Medical History:  Past Medical History:  Diagnosis Date  . ADD (attention deficit disorder)   . Allergy   . Asthma   . Bipolar 1 disorder (HCC)    No past surgical history on file.  Family Psychiatric History: See intake H&P for full details. Reviewed, with no updates at this time.   Family History:  Family History  Problem Relation Age of Onset  . Cancer Father        testicular  . ADD / ADHD Father   . Depression Father   . Alcohol abuse Mother   . Depression Mother   . Bipolar disorder Maternal Grandmother   . Dementia Maternal Grandmother   .  Bipolar disorder Maternal Aunt   . Bipolar disorder Cousin   . Diabetes Neg Hx   . Heart disease Neg Hx   . Early death Neg Hx   . Stroke Neg Hx     Social History:  Social History   Social History  . Marital status: Single    Spouse name: N/A  . Number of children: N/A  . Years of education: N/A   Occupational History  . cook    Social History Main Topics  . Smoking status: Former Games developermoker  . Smokeless tobacco: Never Used  . Alcohol use 1.2 oz/week    2 Cans of beer per week     Comment: social - only about 3 drinks  . Drug use: No     Comment: periodic use of marijuana  . Sexual activity: Not Currently    Partners: Female    Birth control/ protection: Condom   Other Topics Concern  . None   Social History Narrative   Exercise - assorted (walking, running, cycling, and cardio)    Allergies:  No Known Allergies  Metabolic Disorder Labs: Lab Results  Component Value Date   HGBA1C 5.3 09/18/2016   MPG  105 09/18/2016   No results found for: PROLACTIN Lab Results  Component Value Date   CHOL 109 09/18/2016   TRIG 95 09/18/2016   HDL 43 09/18/2016   CHOLHDL 2.5 09/18/2016   VLDL 19 09/18/2016   LDLCALC 47 09/18/2016   LDLCALC 79 01/16/2015     Current Medications: Current Outpatient Prescriptions  Medication Sig Dispense Refill  . apixaban (ELIQUIS) 5 MG TABS tablet Take 2 tablets by mouth twice daily for 7 days, then take 1 tablet by mouth twice daily thereafter. 190 tablet 0  . buPROPion (WELLBUTRIN XL) 150 MG 24 hr tablet Take 1 tablet (150 mg total) by mouth every morning. 90 tablet 1  . hydrOXYzine (VISTARIL) 25 MG capsule Take 1 capsule (25 mg total) by mouth 3 (three) times daily as needed for anxiety. 60 capsule 1  . lamoTRIgine (LAMICTAL) 200 MG tablet Take 1 tablet (200 mg total) by mouth daily. 90 tablet 1  . lurasidone (LATUDA) 80 MG TABS tablet Take 1 tablet (80 mg total) by mouth daily with breakfast. 90 tablet 1  . Omega-3 Fatty Acids (FISH  OIL) 1000 MG CAPS Take 1 capsule by mouth 2 (two) times daily.    . temazepam (RESTORIL) 15 MG capsule Take 1 capsule (15 mg total) by mouth at bedtime. 30 capsule 1   No current facility-administered medications for this visit.     Neurologic: Headache: Negative Seizure: Negative Paresthesias: Negative  Musculoskeletal: Strength & Muscle Tone: within normal limits Gait & Station: normal Patient leans: N/A  Psychiatric Specialty Exam: Review of Systems  Constitutional: Negative.   HENT: Negative.   Respiratory: Negative.   Gastrointestinal: Negative.   Genitourinary: Negative.   Neurological: Negative.   Psychiatric/Behavioral: Positive for depression. Negative for hallucinations, substance abuse and suicidal ideas. The patient is not nervous/anxious and does not have insomnia.     Blood pressure 122/76, pulse 76, height 6\' 3"  (1.905 m), weight 159 lb (72.1 kg).Body mass index is 19.87 kg/m.  General Appearance: Well Groomed  Eye Contact:  Fair  Speech:  Clear and Coherent  Volume:  Normal  Mood:  Dysphoric and Continued pessimism and poor self-esteem  Affect:  Depressed  Thought Process:  Goal Directed  Orientation:  Full (Time, Place, and Person)  Thought Content: Logical   Suicidal Thoughts:  No  Homicidal Thoughts:  No  Memory:  Immediate;   Good  Judgement:  Good  Insight:  Fair  Psychomotor Activity:  Normal  Concentration:  Concentration: Fair and Attention Span: Fair  Recall:  NA  Fund of Knowledge: Good  Language: Good  Akathisia:  Negative  Handed:  Right  AIMS (if indicated):  n/a  Assets:  Communication Skills Desire for Improvement Financial Resources/Insurance Housing Intimacy Leisure Time Physical Health Resilience Social Support Talents/Skills Transportation Vocational/Educational  ADL's:  Intact  Cognition: WNL  Sleep:  15 hours nightly   Treatment Plan Summary: Isaiah Garza is a 26 year old male with bipolar disorder, who presents  for follow-up.  He initially had some improvement with titration of latuda and lamictal but we have unfortunately hit somewhat of a plateau in terms of relief.  He continues with severe bipolar depression.  He has not had any major life changes or stressors.  PHQ9 as above. No substance use.  I believe he would be a good candidate for ECT and the patient has agreed to meet with out ECT expert, Dr. Toni Amend.  No active suicidal ideation or safety issues at this time.  Bipolar  I disorder - Latuda maintained at 80 mg daily - Consider Abilify instead of latuda; recently increased, so we will give it a few more weeks - Lamictal 200 mg daily - Wellbutrin 300 mg XL daily for depression and low energy - Continue Restoril 15 mg nightly for sleep - Continue fish oil daily - Patient continues in therapy with Shanon Rosser [phone]) - Follow-up with writer in 4 weeks - Patient has failed treatment with trials of risperidone, invega, trileptal - Referral for ECT with Dr. Blinda Leatherwood, MD 02/04/2017, 12:23 PM

## 2017-02-07 ENCOUNTER — Ambulatory Visit
Admission: RE | Admit: 2017-02-07 | Discharge: 2017-02-07 | Disposition: A | Discharge status: Home / Self Care | Payer: BLUE CROSS/BLUE SHIELD | Source: Ambulatory Visit | Attending: Psychiatry | Admitting: Psychiatry

## 2017-02-07 ENCOUNTER — Other Ambulatory Visit: Payer: Self-pay | Admitting: Primary Care

## 2017-02-07 ENCOUNTER — Encounter: Payer: Self-pay | Admitting: Psychiatry

## 2017-02-07 ENCOUNTER — Ambulatory Visit (INDEPENDENT_AMBULATORY_CARE_PROVIDER_SITE_OTHER): Payer: BLUE CROSS/BLUE SHIELD | Admitting: Psychiatry

## 2017-02-07 VITALS — BP 122/78 | HR 87 | Temp 98.5°F | Wt 160.8 lb

## 2017-02-07 DIAGNOSIS — F319 Bipolar disorder, unspecified: Secondary | ICD-10-CM | POA: Diagnosis not present

## 2017-02-07 DIAGNOSIS — Z01818 Encounter for other preprocedural examination: Secondary | ICD-10-CM | POA: Insufficient documentation

## 2017-02-07 DIAGNOSIS — Z01812 Encounter for preprocedural laboratory examination: Secondary | ICD-10-CM | POA: Insufficient documentation

## 2017-02-07 DIAGNOSIS — I2699 Other pulmonary embolism without acute cor pulmonale: Secondary | ICD-10-CM

## 2017-02-07 DIAGNOSIS — R918 Other nonspecific abnormal finding of lung field: Secondary | ICD-10-CM | POA: Diagnosis not present

## 2017-02-07 DIAGNOSIS — Z01810 Encounter for preprocedural cardiovascular examination: Secondary | ICD-10-CM | POA: Insufficient documentation

## 2017-02-07 HISTORY — DX: Other pulmonary embolism without acute cor pulmonale: I26.99

## 2017-02-07 HISTORY — DX: Unspecified osteoarthritis, unspecified site: M19.90

## 2017-02-07 LAB — CBC
HCT: 44.8 % (ref 40.0–52.0)
HEMOGLOBIN: 15.2 g/dL (ref 13.0–18.0)
MCH: 30 pg (ref 26.0–34.0)
MCHC: 33.9 g/dL (ref 32.0–36.0)
MCV: 88.6 fL (ref 80.0–100.0)
PLATELETS: 229 10*3/uL (ref 150–440)
RBC: 5.06 MIL/uL (ref 4.40–5.90)
RDW: 14 % (ref 11.5–14.5)
WBC: 5.7 10*3/uL (ref 3.8–10.6)

## 2017-02-07 LAB — BASIC METABOLIC PANEL
Anion gap: 6 (ref 5–15)
BUN: 14 mg/dL (ref 6–20)
CHLORIDE: 103 mmol/L (ref 101–111)
CO2: 29 mmol/L (ref 22–32)
Calcium: 9.8 mg/dL (ref 8.9–10.3)
Creatinine, Ser: 0.87 mg/dL (ref 0.61–1.24)
GFR calc Af Amer: >60 mL/min (ref >60)
Glucose, Bld: 108 mg/dL — ABNORMAL HIGH (ref 65–99)
POTASSIUM: 3.8 mmol/L (ref 3.5–5.1)
SODIUM: 138 mmol/L (ref 135–145)

## 2017-02-07 LAB — URINALYSIS, ROUTINE W REFLEX MICROSCOPIC
Bilirubin Urine: NEGATIVE
Glucose, UA: NEGATIVE mg/dL
Hgb urine dipstick: NEGATIVE
Ketones, ur: NEGATIVE mg/dL
LEUKOCYTES UA: NEGATIVE
Nitrite: NEGATIVE
PROTEIN: NEGATIVE mg/dL
Specific Gravity, Urine: 1.029 (ref 1.005–1.030)
pH: 6 (ref 5.0–8.0)

## 2017-02-07 NOTE — Progress Notes (Signed)
ECT consultation evaluation for 26 year old man with bipolar disorder referred by his outpatient psychiatrist. Patient reports that ever since his hospitalization in January of this year his mood has been persistently down and probably getting worse. He says his current mood feels depressed and down and negative almost all the time. Energy level is low. Feels hopeless about ever getting back to his previous level of functioning. He says that he sleeps sometimes up to 15 hours a day. Has very little activity outside the home. He denies that he's having any auditory hallucinations but describes ruminative thoughts that he goes through every day that keep him feeling frustrated. He is currently being prescribed appropriate medicine for depressed bipolar disorder by his outpatient psychiatrist. He denies that he is abusing any drugs currently. Patient has had passive suicidal thoughts without any intention or plan of acting on it. He was referred for ECT because of multiple trials he has had on appropriate medicine for bipolar depression including lithium, Latuda, Zyprexa or without sustained benefit. Also on lamotrigine. Major life stressors are his frustration with not being able to get back to his educational functioning.    Social history: Patient lives with his mother and other members of his family. Has very little activity right now. Dropped out of college after his last bipolar manic episode and has not returned.  Medical history: Patient had a pulmonary embolism diagnosed after his last hospitalization thought to probably be related to being static in bed for extended periods of time. He is on a blood thinner right now. Workup for coagulopathy is pending.  Substance abuse history: Prior abuse of marijuana but he claims he is not using any drugs at all currently.  Past psychiatric history: Reportedly has had 3 previous psychiatric hospitalizations. No history of suicide attempts or violence. Clearly  manic and agitated on his last admission. Has been on multiple antipsychotics and mood stabilizers and mood is staying consistently down now.  Mental status exam: Casually dressed but oddly dressed young man looks younger than his stated age. Cooperative with the interview. Good eye contact. Affect overall euthymic a little anxious. Patient says he is feeling relatively good at the moment but that he is certain that most of the time he is sad and depressed. His thoughts appear to be lucid he didn't make any bizarre or delusional statements. He talks about some ruminative worries that he has but he has insight into how odd they are. He has suicidal thoughts with no intention or plan of acting on it and has no homicidal ideation. Alert and oriented 4 appears to be of normal intelligence short and long-term memory intact judgment and insight appropriate.  Based on diagnosis of bipolar disorder currently depressed type patient would be a good candidate for ECT. Has no contraindications to it. He has had multiple trials of appropriate medicine and remains very dysfunctional. We discussed in detail the practicalities of ECT. Patient is in favor proceeding with treatment. The earliest we will be able to start would be the first week of June. Case reviewed with the full ECT team as well. Labs of all been ordered. We discussed his medicines and how if we decide to proceed with ECT I would like him to cut his lamotrigine dose in half but not necessarily discontinue it and to hold the Restoril at least the night before treatment but that no other medicine change will need to be made.

## 2017-02-07 NOTE — Pre-Procedure Instructions (Signed)
Met with patient and discussed the process of ECT: *Location of admission *Location of Same Day Desk *What to expect the first day (consents, evaluations etc.) *How he may feel after the treatment *Patient Rights *Answered questions  Patient verbalized understanding of the education provided. This nurse stated his appointment would be June 4. Patient verbalized understanding.MD notified.

## 2017-02-07 NOTE — Telephone Encounter (Signed)
Yes, he needs to take for a duration of 6-9 months

## 2017-02-07 NOTE — Telephone Encounter (Signed)
Does patient need to continue this? apixaban (ELIQUIS) 5 MG TABS tablet. It was prescribed by Jae DireKate on 11/12/2016.

## 2017-02-09 ENCOUNTER — Encounter: Payer: Self-pay | Admitting: Internal Medicine

## 2017-02-18 ENCOUNTER — Telehealth: Payer: Self-pay | Admitting: *Deleted

## 2017-02-18 ENCOUNTER — Other Ambulatory Visit: Payer: Self-pay | Admitting: Psychiatry

## 2017-02-21 ENCOUNTER — Encounter: Payer: Self-pay | Admitting: Anesthesiology

## 2017-02-21 ENCOUNTER — Encounter
Admission: RE | Admit: 2017-02-21 | Discharge: 2017-02-21 | Disposition: A | Discharge status: Home / Self Care | Payer: BLUE CROSS/BLUE SHIELD | Source: Ambulatory Visit | Attending: Psychiatry | Admitting: Psychiatry

## 2017-02-21 DIAGNOSIS — Z86711 Personal history of pulmonary embolism: Secondary | ICD-10-CM | POA: Insufficient documentation

## 2017-02-21 DIAGNOSIS — R413 Other amnesia: Secondary | ICD-10-CM | POA: Insufficient documentation

## 2017-02-21 DIAGNOSIS — Z8043 Family history of malignant neoplasm of testis: Secondary | ICD-10-CM | POA: Diagnosis not present

## 2017-02-21 DIAGNOSIS — Z811 Family history of alcohol abuse and dependence: Secondary | ICD-10-CM | POA: Insufficient documentation

## 2017-02-21 DIAGNOSIS — F314 Bipolar disorder, current episode depressed, severe, without psychotic features: Secondary | ICD-10-CM

## 2017-02-21 DIAGNOSIS — Z820 Family history of epilepsy and other diseases of the nervous system: Secondary | ICD-10-CM | POA: Diagnosis not present

## 2017-02-21 DIAGNOSIS — F909 Attention-deficit hyperactivity disorder, unspecified type: Secondary | ICD-10-CM | POA: Insufficient documentation

## 2017-02-21 DIAGNOSIS — F319 Bipolar disorder, unspecified: Secondary | ICD-10-CM | POA: Insufficient documentation

## 2017-02-21 DIAGNOSIS — Z818 Family history of other mental and behavioral disorders: Secondary | ICD-10-CM | POA: Diagnosis not present

## 2017-02-21 MED ORDER — KETOROLAC TROMETHAMINE 30 MG/ML IJ SOLN
INTRAMUSCULAR | Status: AC
  Filled 2017-02-21: qty 1

## 2017-02-21 MED ORDER — SODIUM CHLORIDE 0.9 % IV SOLN
INTRAVENOUS | Status: DC | PRN
  Administered 2017-02-21: 11:00:00 via INTRAVENOUS

## 2017-02-21 MED ORDER — KETOROLAC TROMETHAMINE 30 MG/ML IJ SOLN
30.0000 mg | Freq: Once | INTRAMUSCULAR | Status: AC
Start: 2017-02-21 — End: 2017-02-21
  Administered 2017-02-21: 30 mg via INTRAVENOUS

## 2017-02-21 MED ORDER — SODIUM CHLORIDE 0.9 % IV SOLN
500.0000 mL | Freq: Once | INTRAVENOUS | Status: AC
  Administered 2017-02-21: 1000 mL via INTRAVENOUS

## 2017-02-21 MED ORDER — SUCCINYLCHOLINE CHLORIDE 20 MG/ML IJ SOLN
INTRAMUSCULAR | Status: DC | PRN
  Administered 2017-02-21: 100 mg via INTRAVENOUS

## 2017-02-21 MED ORDER — METHOHEXITAL SODIUM 100 MG/10ML IV SOSY
PREFILLED_SYRINGE | INTRAVENOUS | Status: DC | PRN
  Administered 2017-02-21: 50 mg via INTRAVENOUS
  Administered 2017-02-21: 30 mg via INTRAVENOUS
  Administered 2017-02-21: 70 mg via INTRAVENOUS

## 2017-02-21 MED ORDER — SUCCINYLCHOLINE CHLORIDE 20 MG/ML IJ SOLN
INTRAMUSCULAR | Status: AC
  Filled 2017-02-21: qty 1

## 2017-02-21 NOTE — H&P (Signed)
Isaiah Garza is an 26 y.o. male.   Chief Complaint: Currently moderate depression mood is up and down. No active suicidal plan or intent HPI: Patient has a history of bipolar disorder or schizoaffective disorder currently in sustained depression without full response to medication  Past Medical History:  Diagnosis Date  . ADD (attention deficit disorder)   . ADHD (attention deficit hyperactivity disorder)   . Allergy   . Anxiety   . Arthritis    neck  . Asthma   . Bipolar 1 disorder (HCC)   . Depression   . Pulmonary embolism (HCC) 10/2016    Past Surgical History:  Procedure Laterality Date  . NO PAST SURGERIES      Family History  Problem Relation Age of Onset  . Cancer Father        testicular  . ADD / ADHD Father   . Depression Father   . Alcohol abuse Mother   . Depression Mother   . Anxiety disorder Sister   . Bipolar disorder Maternal Grandmother   . Dementia Maternal Grandmother   . Bipolar disorder Maternal Aunt   . Bipolar disorder Cousin   . Diabetes Neg Hx   . Heart disease Neg Hx   . Early death Neg Hx   . Stroke Neg Hx    Social History:  reports that he has never smoked. He has never used smokeless tobacco. He reports that he drinks alcohol. He reports that he does not use drugs.  Allergies: No Known Allergies   (Not in a hospital admission)  No results found for this or any previous visit (from the past 48 hour(s)). No results found.  Review of Systems  Constitutional: Negative.   HENT: Negative.   Eyes: Negative.   Respiratory: Negative.   Cardiovascular: Negative.   Gastrointestinal: Negative.   Musculoskeletal: Negative.   Skin: Negative.   Neurological: Negative.   Psychiatric/Behavioral: Positive for depression. Negative for hallucinations, memory loss, substance abuse and suicidal ideas. The patient has insomnia. The patient is not nervous/anxious.     Blood pressure 115/75, pulse 73, temperature 98.2 F (36.8 C), temperature  source Oral, resp. rate 16, height 6\' 3"  (1.905 m), weight 72.6 kg (160 lb), SpO2 98 %. Physical Exam  Nursing note and vitals reviewed. Constitutional: He appears well-developed and well-nourished.  HENT:  Head: Normocephalic and atraumatic.  Eyes: Conjunctivae are normal. Pupils are equal, round, and reactive to light.  Neck: Normal range of motion.  Cardiovascular: Regular rhythm and normal heart sounds.   Respiratory: Effort normal and breath sounds normal. No respiratory distress.  GI: Soft.  Musculoskeletal: Normal range of motion.  Neurological: He is alert.  Skin: Skin is warm and dry.  Psychiatric: Judgment normal. His affect is blunt. His speech is delayed. He is slowed. Thought content is not paranoid. He expresses no homicidal and no suicidal ideation.     Assessment/Plan Beginning index course right unilateral treatment with a goal of alleviating symptoms of depression  Mordecai RasmussenJohn Clapacs, MD 02/21/2017, 10:31 AM

## 2017-02-21 NOTE — Anesthesia Procedure Notes (Signed)
Date/Time: 02/21/2017 10:44 AM Performed by: Irving BurtonBACHICH, Jereme Loren Pre-anesthesia Checklist: Patient identified, Emergency Drugs available, Suction available, Patient being monitored and Timeout performed Patient Re-evaluated:Patient Re-evaluated prior to inductionOxygen Delivery Method: Circle system utilized Preoxygenation: Pre-oxygenation with 100% oxygen Intubation Type: IV induction Ventilation: Mask ventilation without difficulty Airway Equipment and Method: Bite block Dental Injury: Teeth and Oropharynx as per pre-operative assessment

## 2017-02-21 NOTE — Procedures (Signed)
ECT SERVICES Physician's Interval Evaluation & Treatment Note  Patient Identification: Isaiah NeighborsCharles A Rossel MRN:  [id number] Date of Evaluation:  02/21/2017 TX #: 1  MADRS: 28  MMSE: 28  P.E. Findings:  Heart and lungs normal vitals unremarkable nothing remarkable on the physical exam  Psychiatric Interval Note:  Mood continues to be down and depressed not suicidal not currently psychotic  Subjective:  Patient is a 26 y.o. male seen for evaluation for Electroconvulsive Therapy. Depression no physical complaints  Treatment Summary:   [x]   Right Unilateral             []  Bilateral   % Energy : 0.3 ms 30%   Impedance: 1380 ohms  Seizure Energy Index: 18,319 V squared  Postictal Suppression Index: 90%  Seizure Concordance Index: 96%  Medications  Pre Shock: Toradol 30 mg Brevital 150 mg succinylcholine 100 mg  Post Shock:    Seizure Duration: 45 seconds by EMG 65 seconds by EEG   Comments: Patient initially was tried on Brevital at 70 mg which was ineffective we ultimately went up to 150. Plan will be to try 120 of both Brevital and succinylcholine as a compromise dose next time.   Lungs:  [x]   Clear to auscultation               []  Other:   Heart:    [x]   Regular rhythm             []  irregular rhythm    [x]   Previous H&P reviewed, patient examined and there are NO CHANGES                 []   Previous H&P reviewed, patient examined and there are changes noted.   Mordecai RasmussenJohn Yuval Rubens, MD 6/4/201810:33 AM

## 2017-02-21 NOTE — Transfer of Care (Signed)
Immediate Anesthesia Transfer of Care Note  Patient: Isaiah Garza  Procedure(s) Performed: ECT   Patient Location: PACU  Anesthesia Type:General  Level of Consciousness: awake and alert   Airway & Oxygen Therapy: Patient connected to face mask oxygen  Post-op Assessment: Post -op Vital signs reviewed and stable  Post vital signs: stable  Last Vitals:  Vitals:   02/21/17 0834 02/21/17 1057  BP: 115/75 127/84  Pulse: 73 98  Resp: 16 (!) 26  Temp: 36.8 C 36.7 C    Last Pain:  Vitals:   02/21/17 0834  TempSrc: Oral  PainSc: 3       Patients Stated Pain Goal: 0 (02/21/17 0834)  Complications: No apparent anesthesia complications

## 2017-02-21 NOTE — Anesthesia Preprocedure Evaluation (Signed)
Anesthesia Evaluation  Patient identified by MRN, date of birth, ID band Patient awake    Reviewed: Allergy & Precautions, H&P , NPO status , Patient's Chart, lab work & pertinent test results, reviewed documented beta blocker date and time   History of Anesthesia Complications Negative for: history of anesthetic complications  Airway Mallampati: I  TM Distance: >3 FB Neck ROM: full    Dental  (+) Dental Advidsory Given, Teeth Intact   Pulmonary shortness of breath, asthma , neg sleep apnea, neg COPD, neg recent URI,  H/o PE in February 2018          Cardiovascular Exercise Tolerance: Good negative cardio ROS Normal cardiovascular exam     Neuro/Psych PSYCHIATRIC DISORDERS (ADHD, bipolar) negative neurological ROS     GI/Hepatic negative GI ROS, Neg liver ROS,   Endo/Other  negative endocrine ROS  Renal/GU negative Renal ROS  negative genitourinary   Musculoskeletal   Abdominal   Peds  Hematology negative hematology ROS (+)   Anesthesia Other Findings Past Medical History: No date: ADD (attention deficit disorder) No date: ADHD (attention deficit hyperactivity disorder) No date: Allergy No date: Anxiety No date: Arthritis     Comment: neck No date: Asthma No date: Bipolar 1 disorder (HCC) No date: Depression 10/2016: Pulmonary embolism (HCC)   Reproductive/Obstetrics negative OB ROS                             Anesthesia Physical Anesthesia Plan  ASA: I  Anesthesia Plan: General   Post-op Pain Management:    Induction:   Airway Management Planned:   Additional Equipment:   Intra-op Plan:   Post-operative Plan:   Informed Consent: I have reviewed the patients History and Physical, chart, labs and discussed the procedure including the risks, benefits and alternatives for the proposed anesthesia with the patient or authorized representative who has indicated his/her  understanding and acceptance.   Dental Advisory Given  Plan Discussed with: Anesthesiologist, CRNA and Surgeon  Anesthesia Plan Comments:         Anesthesia Quick Evaluation

## 2017-02-21 NOTE — Anesthesia Post-op Follow-up Note (Cosign Needed)
Anesthesia QCDR form completed.        

## 2017-02-21 NOTE — Discharge Instructions (Signed)
1)  The drugs that you have been given will stay in your system until tomorrow so for the       next 24 hours you should not:  A. Drive an automobile  B. Make any legal decisions  C. Drink any alcoholic beverages  2)  You may resume your regular meals upon return home.  3)  A responsible adult must take you home.  Someone should stay with you for a few          hours, then be available by phone for the remainder of the treatment day.  4)  You May experience any of the following symptoms:  Headache, Nausea and a dry mouth (due to the medications you were given),  temporary memory loss and some confusion, or sore muscles (a warm bath  should help this).  If you you experience any of these symptoms let us know on                your return visit.  5)  Report any of the following: any acute discomfort, severe headache, or temperature        greater than 100.5 F.   Also report any unusual redness, swelling, drainage, or pain         at your IV site.    You may report Symptoms to:  [facility] [place] [facility]          Phone: [phone], ECT Department           or Dr. Shary Keylapac's office [phone]  6)  Your next ECT Treatment is Day Wednesday  Date  February 23, 2017  We will call 2 days prior to your scheduled appointment for arrival times.  7)  Nothing to eat or drink after midnight the night before your procedure.  8)  Take .     With a sip of water the morning of your procedure.  9)  Other Instructions: Call [phone] to cancel the morning of your procedure due         to illness or emergency.  10) We will call within 72 hours to assess how you are feeling.

## 2017-02-22 ENCOUNTER — Other Ambulatory Visit: Payer: Self-pay | Admitting: Psychiatry

## 2017-02-22 ENCOUNTER — Telehealth (HOSPITAL_COMMUNITY): Payer: Self-pay | Admitting: *Deleted

## 2017-02-22 NOTE — Anesthesia Postprocedure Evaluation (Signed)
Anesthesia Post Note  Patient: Isaiah Garza  Procedure(s) Performed: * No procedures listed *  Patient location during evaluation: PACU Anesthesia Type: General Level of consciousness: awake and alert Pain management: pain level controlled Vital Signs Assessment: post-procedure vital signs reviewed and stable Respiratory status: spontaneous breathing, nonlabored ventilation, respiratory function stable and patient connected to nasal cannula oxygen Cardiovascular status: blood pressure returned to baseline and stable Postop Assessment: no signs of nausea or vomiting Anesthetic complications: no     Last Vitals:  Vitals:   02/21/17 1127 02/21/17 1137  BP: 108/72 101/66  Pulse: 80 74  Resp: 15 16  Temp: 37.1 C     Last Pain:  Vitals:   02/21/17 1127  TempSrc:   PainSc: 0-No pain                 Lenard SimmerAndrew Abdurahman Rugg

## 2017-02-22 NOTE — Telephone Encounter (Signed)
Called for prior authorization of ECT. Spoke with Jacinto ReapMichela B. LPC with [employer] [phone]  who approved 15 sessions until 04/01/17.

## 2017-02-23 ENCOUNTER — Encounter: Payer: Self-pay | Admitting: Anesthesiology

## 2017-02-23 ENCOUNTER — Encounter (HOSPITAL_BASED_OUTPATIENT_CLINIC_OR_DEPARTMENT_OTHER)
Admission: RE | Admit: 2017-02-23 | Discharge: 2017-02-23 | Disposition: A | Discharge status: Home / Self Care | Payer: BLUE CROSS/BLUE SHIELD | Source: Ambulatory Visit | Attending: Psychiatry | Admitting: Psychiatry

## 2017-02-23 DIAGNOSIS — F250 Schizoaffective disorder, bipolar type: Secondary | ICD-10-CM

## 2017-02-23 DIAGNOSIS — F319 Bipolar disorder, unspecified: Secondary | ICD-10-CM | POA: Diagnosis not present

## 2017-02-23 MED ORDER — SODIUM CHLORIDE 0.9 % IV SOLN
500.0000 mL | Freq: Once | INTRAVENOUS | Status: AC
  Administered 2017-02-23: 1000 mL via INTRAVENOUS

## 2017-02-23 MED ORDER — METHOHEXITAL SODIUM 100 MG/10ML IV SOSY
PREFILLED_SYRINGE | INTRAVENOUS | Status: DC | PRN
  Administered 2017-02-23: 120 mg via INTRAVENOUS

## 2017-02-23 MED ORDER — KETOROLAC TROMETHAMINE 30 MG/ML IJ SOLN
30.0000 mg | Freq: Once | INTRAMUSCULAR | Status: AC
  Administered 2017-02-23: 30 mg via INTRAVENOUS

## 2017-02-23 MED ORDER — MIDAZOLAM HCL 2 MG/2ML IJ SOLN
INTRAMUSCULAR | Status: DC | PRN
  Administered 2017-02-23: 2 mg via INTRAVENOUS

## 2017-02-23 MED ORDER — KETOROLAC TROMETHAMINE 30 MG/ML IJ SOLN
INTRAMUSCULAR | Status: AC
  Administered 2017-02-23: 30 mg via INTRAVENOUS
  Filled 2017-02-23: qty 1

## 2017-02-23 MED ORDER — SODIUM CHLORIDE 0.9 % IV SOLN
INTRAVENOUS | Status: DC | PRN
  Administered 2017-02-23: 10:00:00 via INTRAVENOUS

## 2017-02-23 MED ORDER — SUCCINYLCHOLINE CHLORIDE 200 MG/10ML IV SOSY
PREFILLED_SYRINGE | INTRAVENOUS | Status: DC | PRN
  Administered 2017-02-23: 120 mg via INTRAVENOUS

## 2017-02-23 MED ORDER — SUCCINYLCHOLINE CHLORIDE 20 MG/ML IJ SOLN
INTRAMUSCULAR | Status: AC
Start: 2017-02-23 — End: 2017-02-23
  Filled 2017-02-23: qty 1

## 2017-02-23 MED ORDER — MIDAZOLAM HCL 2 MG/2ML IJ SOLN
INTRAMUSCULAR | Status: AC
  Filled 2017-02-23: qty 2

## 2017-02-23 NOTE — Transfer of Care (Signed)
Immediate Anesthesia Transfer of Care Note  Patient: Isaiah Garza  Procedure(s) Performed: ECT  Patient Location: PACU  Anesthesia Type:General  Level of Consciousness: sedated  Airway & Oxygen Therapy: Patient Spontanous Breathing  Post-op Assessment: Report given to RN and Post -op Vital signs reviewed and stable  Post vital signs: Reviewed and stable  Last Vitals:  Vitals:   02/23/17 0849 02/23/17 1115  BP: 121/70 117/79  Pulse: 68 67  Resp: 17 18  Temp: 36.8 C 37.1 C    Last Pain:  Vitals:   02/23/17 0849  TempSrc: Oral  PainSc: 3       Patients Stated Pain Goal: 0 (02/23/17 0849)  Complications: No apparent anesthesia complications

## 2017-02-23 NOTE — Anesthesia Postprocedure Evaluation (Signed)
Anesthesia Post Note  Patient: Isaiah Garza  Procedure(s) Performed: * No procedures listed *  Patient location during evaluation: PACU Anesthesia Type: General Level of consciousness: awake and alert Pain management: pain level controlled Vital Signs Assessment: post-procedure vital signs reviewed and stable Respiratory status: spontaneous breathing and respiratory function stable Cardiovascular status: stable Anesthetic complications: no     Last Vitals:  Vitals:   02/23/17 1135 02/23/17 1145  BP: 121/78 119/74  Pulse: 79 78  Resp: 18 19  Temp:      Last Pain:  Vitals:   02/23/17 1145  TempSrc:   PainSc: 0-No pain                 KEPHART,WILLIAM K

## 2017-02-23 NOTE — Anesthesia Preprocedure Evaluation (Signed)
Anesthesia Evaluation  Patient identified by MRN, date of birth, ID band Patient awake    Reviewed: Allergy & Precautions, H&P , NPO status , Patient's Chart, lab work & pertinent test results, reviewed documented beta blocker date and time   Airway Mallampati: II   Neck ROM: full    Dental  (+) Poor Dentition   Pulmonary neg pulmonary ROS, asthma ,    Pulmonary exam normal        Cardiovascular negative cardio ROS Normal cardiovascular exam Rhythm:regular Rate:Normal     Neuro/Psych PSYCHIATRIC DISORDERS Anxiety Depression Bipolar Disorder negative neurological ROS  negative psych ROS   GI/Hepatic negative GI ROS, Neg liver ROS,   Endo/Other  negative endocrine ROS  Renal/GU negative Renal ROS  negative genitourinary   Musculoskeletal  (+) Arthritis ,   Abdominal   Peds  Hematology negative hematology ROS (+)   Anesthesia Other Findings Past Medical History: No date: ADD (attention deficit disorder) No date: ADHD (attention deficit hyperactivity disorder) No date: Allergy No date: Anxiety No date: Arthritis     Comment: neck No date: Asthma No date: Bipolar 1 disorder (HCC) No date: Depression 10/2016: Pulmonary embolism (HCC) Past Surgical History: No date: NO PAST SURGERIES BMI    Body Mass Index:  19.75 kg/m     Reproductive/Obstetrics negative OB ROS                             Anesthesia Physical Anesthesia Plan  ASA: II  Anesthesia Plan: General   Post-op Pain Management:    Induction:   PONV Risk Score and Plan:   Airway Management Planned:   Additional Equipment:   Intra-op Plan:   Post-operative Plan:   Informed Consent: I have reviewed the patients History and Physical, chart, labs and discussed the procedure including the risks, benefits and alternatives for the proposed anesthesia with the patient or authorized representative who has indicated  his/her understanding and acceptance.   Dental Advisory Given  Plan Discussed with: CRNA  Anesthesia Plan Comments:         Anesthesia Quick Evaluation

## 2017-02-23 NOTE — Anesthesia Procedure Notes (Signed)
Date/Time: 02/23/2017 11:05 AM Performed by: Lily KocherPERALTA, Isaiah Jares Pre-anesthesia Checklist: Patient identified, Emergency Drugs available, Suction available and Patient being monitored Patient Re-evaluated:Patient Re-evaluated prior to inductionOxygen Delivery Method: Circle system utilized Preoxygenation: Pre-oxygenation with 100% oxygen Intubation Type: IV induction Ventilation: Mask ventilation without difficulty and Mask ventilation throughout procedure Airway Equipment and Method: Bite block Placement Confirmation: positive ETCO2 Dental Injury: Teeth and Oropharynx as per pre-operative assessment

## 2017-02-23 NOTE — Procedures (Signed)
ECT SERVICES Physician's Interval Evaluation & Treatment Note  Patient Identification: Bing NeighborsCharles A Hornbeck MRN:  [id number] Date of Evaluation:  02/23/2017 TX #: 2  MADRS:   MMSE:   P.E. Findings:  Heart and lungs unremarkable vitals unremarkable change to physical exam  Psychiatric Interval Note:  Feeling depressed negative  Subjective:  Patient is a 26 y.o. male seen for evaluation for Electroconvulsive Therapy. As some soreness after his last treatment is feeling better today  Treatment Summary:   []   Right Unilateral             []  Bilateral   % Energy : 0.3 ms 40%   Impedance: 1110 ohms  Seizure Energy Index: 16,859 V squared  Postictal Suppression Index: 86%  Seizure Concordance Index: 96%  Medications  Pre Shock: Toradol 30 mg Brevital 120 mg succinylcholine 120 mg  Post Shock: Versed 2 mg  Seizure Duration: 63 seconds by EMG 124 seconds by EEG   Comments: Still had some movement were going to increase the succinylcholine to 150 mg next time   Lungs:  [x]   Clear to auscultation               []  Other:   Heart:    [x]   Regular rhythm             []  irregular rhythm    [x]   Previous H&P reviewed, patient examined and there are NO CHANGES                 []   Previous H&P reviewed, patient examined and there are changes noted.   Mordecai RasmussenJohn Ashkan Chamberland, MD 6/6/201810:53 AM

## 2017-02-23 NOTE — Discharge Instructions (Signed)
1)  The drugs that you have been given will stay in your system until tomorrow so for the       next 24 hours you should not:  A. Drive an automobile  B. Make any legal decisions  C. Drink any alcoholic beverages  2)  You may resume your regular meals upon return home.  3)  A responsible adult must take you home.  Someone should stay with you for a few          hours, then be available by phone for the remainder of the treatment day.  4)  You May experience any of the following symptoms:  Headache, Nausea and a dry mouth (due to the medications you were given),  temporary memory loss and some confusion, or sore muscles (a warm bath  should help this).  If you you experience any of these symptoms let us know on                your return visit.  5)  Report any of the following: any acute discomfort, severe headache, or temperature        greater than 100.5 F.   Also report any unusual redness, swelling, drainage, or pain         at your IV site.    You may report Symptoms to:  [facility] [place] [facility]          Phone: [phone], ECT Department           or Dr. Shary Keylapac's office [phone]  6)  Your next ECT Treatment is Day Friday Date February 25, 2017 at 830am  We will call 2 days prior to your scheduled appointment for arrival times.  7)  Nothing to eat or drink after midnight the night before your procedure.  8)  Take .    With a sip of water the morning of your procedure.  9)  Other Instructions: Call [phone] to cancel the morning of your procedure due         to illness or emergency.  10) We will call within 72 hours to assess how you are feeling.

## 2017-02-23 NOTE — Anesthesia Post-op Follow-up Note (Cosign Needed)
Anesthesia QCDR form completed.        

## 2017-02-23 NOTE — H&P (Signed)
Bing NeighborsCharles A Buffalo is an 26 y.o. male.   Chief Complaint: Patient had some soreness after his last treatment also felt anxious and fearful waking up from his last treatment. Otherwise no change to mental state and no other new complaints HPI: Bipolar or schizoaffective depression which is persistent despite medication trial  Past Medical History:  Diagnosis Date  . ADD (attention deficit disorder)   . ADHD (attention deficit hyperactivity disorder)   . Allergy   . Anxiety   . Arthritis    neck  . Asthma   . Bipolar 1 disorder (HCC)   . Depression   . Pulmonary embolism (HCC) 10/2016    Past Surgical History:  Procedure Laterality Date  . NO PAST SURGERIES      Family History  Problem Relation Age of Onset  . Cancer Father        testicular  . ADD / ADHD Father   . Depression Father   . Alcohol abuse Mother   . Depression Mother   . Anxiety disorder Sister   . Bipolar disorder Maternal Grandmother   . Dementia Maternal Grandmother   . Bipolar disorder Maternal Aunt   . Bipolar disorder Cousin   . Diabetes Neg Hx   . Heart disease Neg Hx   . Early death Neg Hx   . Stroke Neg Hx    Social History:  reports that he has never smoked. He has never used smokeless tobacco. He reports that he drinks alcohol. He reports that he does not use drugs.  Allergies: No Known Allergies   (Not in a hospital admission)  No results found for this or any previous visit (from the past 48 hour(s)). No results found.  Review of Systems  Constitutional: Negative.   HENT: Negative.   Eyes: Negative.   Respiratory: Negative.   Cardiovascular: Negative.   Gastrointestinal: Negative.   Musculoskeletal: Negative.   Skin: Negative.   Neurological: Negative.   Psychiatric/Behavioral: Positive for depression. Negative for hallucinations, memory loss, substance abuse and suicidal ideas. The patient is not nervous/anxious and does not have insomnia.     Blood pressure 121/70, pulse 68,  temperature 98.2 F (36.8 C), temperature source Oral, resp. rate 17, height 6\' 3"  (1.905 m), weight 71.7 kg (158 lb), SpO2 99 %. Physical Exam  Nursing note and vitals reviewed. Constitutional: He appears well-developed and well-nourished.  HENT:  Head: Normocephalic and atraumatic.  Eyes: Conjunctivae are normal. Pupils are equal, round, and reactive to light.  Neck: Normal range of motion.  Cardiovascular: Normal heart sounds.   Respiratory: Effort normal.  GI: Soft.  Musculoskeletal: Normal range of motion.  Neurological: He is alert.  Skin: Skin is warm and dry.  Psychiatric: Judgment normal. His affect is blunt. His speech is delayed. He is slowed. Cognition and memory are normal. He expresses no homicidal and no suicidal ideation.     Assessment/Plan Patient is having treatment #2 right lateral today. Adding Versed after the treatment. Continue 3 times a week schedule  Mordecai RasmussenJohn Braydon Kullman, MD 02/23/2017, 10:46 AM

## 2017-02-24 ENCOUNTER — Other Ambulatory Visit: Payer: Self-pay | Admitting: Psychiatry

## 2017-02-25 ENCOUNTER — Encounter (HOSPITAL_BASED_OUTPATIENT_CLINIC_OR_DEPARTMENT_OTHER)
Admission: RE | Admit: 2017-02-25 | Discharge: 2017-02-25 | Disposition: A | Discharge status: Home / Self Care | Payer: BLUE CROSS/BLUE SHIELD | Source: Ambulatory Visit | Attending: Psychiatry | Admitting: Psychiatry

## 2017-02-25 ENCOUNTER — Encounter: Payer: Self-pay | Admitting: Anesthesiology

## 2017-02-25 ENCOUNTER — Ambulatory Visit: Payer: Self-pay | Admitting: Anesthesiology

## 2017-02-25 DIAGNOSIS — F3130 Bipolar disorder, current episode depressed, mild or moderate severity, unspecified: Secondary | ICD-10-CM | POA: Diagnosis not present

## 2017-02-25 DIAGNOSIS — F319 Bipolar disorder, unspecified: Secondary | ICD-10-CM | POA: Diagnosis not present

## 2017-02-25 MED ORDER — MIDAZOLAM HCL 2 MG/2ML IJ SOLN
INTRAMUSCULAR | Status: DC | PRN
  Administered 2017-02-25: 2 mg via INTRAVENOUS

## 2017-02-25 MED ORDER — MIDAZOLAM HCL 2 MG/2ML IJ SOLN
INTRAMUSCULAR | Status: AC
  Filled 2017-02-25: qty 2

## 2017-02-25 MED ORDER — KETOROLAC TROMETHAMINE 30 MG/ML IJ SOLN
INTRAMUSCULAR | Status: AC
  Administered 2017-02-25: 30 mg via INTRAVENOUS
  Filled 2017-02-25: qty 1

## 2017-02-25 MED ORDER — KETOROLAC TROMETHAMINE 30 MG/ML IJ SOLN
30.0000 mg | Freq: Once | INTRAMUSCULAR | Status: AC
  Administered 2017-02-25: 30 mg via INTRAVENOUS

## 2017-02-25 MED ORDER — SODIUM CHLORIDE 0.9 % IV SOLN
500.0000 mL | Freq: Once | INTRAVENOUS | Status: AC
  Administered 2017-02-25: 1000 mL via INTRAVENOUS

## 2017-02-25 MED ORDER — SUCCINYLCHOLINE CHLORIDE 20 MG/ML IJ SOLN
INTRAMUSCULAR | Status: DC | PRN
  Administered 2017-02-25: 150 mg via INTRAVENOUS

## 2017-02-25 MED ORDER — METHOHEXITAL SODIUM 100 MG/10ML IV SOSY
PREFILLED_SYRINGE | INTRAVENOUS | Status: DC | PRN
  Administered 2017-02-25: 120 mg via INTRAVENOUS

## 2017-02-25 MED ORDER — SODIUM CHLORIDE 0.9 % IV SOLN
INTRAVENOUS | Status: DC | PRN
  Administered 2017-02-25: 12:00:00 via INTRAVENOUS

## 2017-02-25 MED ORDER — SUCCINYLCHOLINE CHLORIDE 20 MG/ML IJ SOLN
INTRAMUSCULAR | Status: AC
  Filled 2017-02-25: qty 1

## 2017-02-25 NOTE — Transfer of Care (Signed)
Immediate Anesthesia Transfer of Care Note  Patient: Isaiah Garza  Procedure(s) Performed: ECT   Patient Location: PACU  Anesthesia Type:General  Level of Consciousness: sedated  Airway & Oxygen Therapy: Patient Spontanous Breathing  Post-op Assessment: Post -op Vital signs reviewed and stable  Post vital signs: stable  Last Vitals:  Vitals:   02/25/17 0905 02/25/17 1230  BP: 120/71 (P) 138/86  Pulse: 60 (P) 86  Resp: 16 (P) 20  Temp: 36.3 C (!) (P) 35.9 C    Last Pain:  Vitals:   02/25/17 0905  TempSrc: Oral  PainSc: 2       Patients Stated Pain Goal: 0 (02/25/17 0905)  Complications: No apparent anesthesia complications

## 2017-02-25 NOTE — Anesthesia Postprocedure Evaluation (Signed)
Anesthesia Post Note  Patient: Isaiah Garza  Procedure(s) Performed: * No procedures listed *  Patient location during evaluation: PACU Anesthesia Type: General Level of consciousness: awake and alert Pain management: pain level controlled Vital Signs Assessment: post-procedure vital signs reviewed and stable Respiratory status: spontaneous breathing and respiratory function stable Cardiovascular status: stable Anesthetic complications: no     Last Vitals:  Vitals:   02/25/17 1241 02/25/17 1250  BP: 126/68 114/74  Pulse: 86 88  Resp: 16 16  Temp:      Last Pain:  Vitals:   02/25/17 1250  TempSrc:   PainSc: 1                  KEPHART,WILLIAM K

## 2017-02-25 NOTE — Procedures (Signed)
ECT SERVICES Physician's Interval Evaluation & Treatment Note  Patient Identification: Bing NeighborsCharles A Christoph MRN:  [id number] Date of Evaluation:  02/25/2017 TX #: 3  MADRS:   MMSE:   P.E. Findings:  No change to physical exam heart and lungs normal vitals normal  Psychiatric Interval Note:  Patient reports that he feels "in a different world" but there is no evidence of psychosis. Doesn't seem to be much different as far as his mood but certainly no worse  Subjective:  Patient is a 26 y.o. male seen for evaluation for Electroconvulsive Therapy. See above.  Treatment Summary:   [x]   Right Unilateral             []  Bilateral   % Energy : 0.3 ms 40%   Impedance: 1420 ohms  Seizure Energy Index: 14,135 V squared  Postictal Suppression Index: 95%  Seizure Concordance Index: 97%  Medications  Pre Shock: Toradol 30 mg Brevital 120 mg succinylcholine 120 mg  Post Shock: Versed 2 mg  Seizure Duration: 37 seconds by EMG 64 seconds by EEG   Comments: Continue index course of treatment into next week   Lungs:  [x]   Clear to auscultation               []  Other:   Heart:    [x]   Regular rhythm             []  irregular rhythm    [x]   Previous H&P reviewed, patient examined and there are NO CHANGES                 []   Previous H&P reviewed, patient examined and there are changes noted.   Mordecai RasmussenJohn Clapacs, MD 6/8/201812:14 PM

## 2017-02-25 NOTE — H&P (Signed)
Isaiah NeighborsCharles A Garza is an 26 y.o. male.   Chief Complaint: 26 year old man with bipolar disorder receiving ECT. Mood is not particularly different although his affect is calm and steady no active evidence of psychosis or suicidal ideation. He mentions a vague sense of derealization HPI: History of bipolar with previous psychotic mania is now in an extended depression  Past Medical History:  Diagnosis Date  . ADD (attention deficit disorder)   . ADHD (attention deficit hyperactivity disorder)   . Allergy   . Anxiety   . Arthritis    neck  . Asthma   . Bipolar 1 disorder (HCC)   . Depression   . Pulmonary embolism (HCC) 10/2016    Past Surgical History:  Procedure Laterality Date  . NO PAST SURGERIES      Family History  Problem Relation Age of Onset  . Cancer Father        testicular  . ADD / ADHD Father   . Depression Father   . Alcohol abuse Mother   . Depression Mother   . Anxiety disorder Sister   . Bipolar disorder Maternal Grandmother   . Dementia Maternal Grandmother   . Bipolar disorder Maternal Aunt   . Bipolar disorder Cousin   . Diabetes Neg Hx   . Heart disease Neg Hx   . Early death Neg Hx   . Stroke Neg Hx    Social History:  reports that he has never smoked. He has never used smokeless tobacco. He reports that he drinks alcohol. He reports that he does not use drugs.  Allergies: No Known Allergies   (Not in a hospital admission)  No results found for this or any previous visit (from the past 48 hour(s)). No results found.  Review of Systems  Constitutional: Negative.   HENT: Negative.   Eyes: Negative.   Respiratory: Negative.   Cardiovascular: Negative.   Gastrointestinal: Negative.   Musculoskeletal: Negative.   Skin: Negative.   Neurological: Negative.   Psychiatric/Behavioral: Negative for depression, hallucinations, memory loss, substance abuse and suicidal ideas. The patient is not nervous/anxious and does not have insomnia.     Blood  pressure 120/71, pulse 60, temperature 97.3 F (36.3 C), temperature source Oral, resp. rate 16, height 6\' 3"  (1.905 m), weight 71.7 kg (158 lb), SpO2 99 %. Physical Exam  Nursing note and vitals reviewed. Constitutional: He appears well-developed and well-nourished.  HENT:  Head: Normocephalic and atraumatic.  Eyes: Conjunctivae are normal. Pupils are equal, round, and reactive to light.  Neck: Normal range of motion.  Cardiovascular: Regular rhythm and normal heart sounds.   Respiratory: Effort normal. No respiratory distress.  GI: Soft.  Musculoskeletal: Normal range of motion.  Neurological: He is alert.  Skin: Skin is warm and dry.  Psychiatric: Judgment normal. His affect is blunt. His speech is delayed. He is slowed. Cognition and memory are normal. He expresses no suicidal ideation.     Assessment/Plan Treatment today follow-up into next week  Isaiah RasmussenJohn Leonel Mccollum, MD 02/25/2017, 12:12 PM

## 2017-02-25 NOTE — Anesthesia Preprocedure Evaluation (Signed)
Anesthesia Evaluation  Patient identified by MRN, date of birth, ID band Patient awake    Reviewed: Allergy & Precautions, H&P , NPO status , Patient's Chart, lab work & pertinent test results, reviewed documented beta blocker date and time   Airway Mallampati: II   Neck ROM: full    Dental  (+) Poor Dentition   Pulmonary neg pulmonary ROS, asthma ,    Pulmonary exam normal        Cardiovascular negative cardio ROS Normal cardiovascular exam Rhythm:regular Rate:Normal     Neuro/Psych PSYCHIATRIC DISORDERS Anxiety Depression Bipolar Disorder negative neurological ROS  negative psych ROS   GI/Hepatic negative GI ROS, Neg liver ROS,   Endo/Other  negative endocrine ROS  Renal/GU negative Renal ROS  negative genitourinary   Musculoskeletal  (+) Arthritis ,   Abdominal   Peds  Hematology negative hematology ROS (+)   Anesthesia Other Findings Past Medical History: No date: ADD (attention deficit disorder) No date: ADHD (attention deficit hyperactivity disorder) No date: Allergy No date: Anxiety No date: Arthritis     Comment: neck No date: Asthma No date: Bipolar 1 disorder (HCC) No date: Depression 10/2016: Pulmonary embolism (HCC) Past Surgical History: No date: NO PAST SURGERIES BMI    Body Mass Index:  19.75 kg/m     Reproductive/Obstetrics negative OB ROS                             Anesthesia Physical  Anesthesia Plan  ASA: II  Anesthesia Plan: General   Post-op Pain Management:    Induction: Intravenous  PONV Risk Score and Plan: 2 and Ondansetron, Dexamethasone and Treatment may vary due to age  Airway Management Planned: Mask  Additional Equipment:   Intra-op Plan:   Post-operative Plan:   Informed Consent: I have reviewed the patients History and Physical, chart, labs and discussed the procedure including the risks, benefits and alternatives for the  proposed anesthesia with the patient or authorized representative who has indicated his/her understanding and acceptance.   Dental Advisory Given  Plan Discussed with: CRNA  Anesthesia Plan Comments:         Anesthesia Quick Evaluation

## 2017-02-25 NOTE — Anesthesia Post-op Follow-up Note (Cosign Needed)
Anesthesia QCDR form completed.        

## 2017-02-25 NOTE — Discharge Instructions (Signed)
1)  The drugs that you have been given will stay in your system until tomorrow so for the       next 24 hours you should not:  A. Drive an automobile  B. Make any legal decisions  C. Drink any alcoholic beverages  2)  You may resume your regular meals upon return home.  3)  A responsible adult must take you home.  Someone should stay with you for a few          hours, then be available by phone for the remainder of the treatment day.  4)  You May experience any of the following symptoms:  Headache, Nausea and a dry mouth (due to the medications you were given),  temporary memory loss and some confusion, or sore muscles (a warm bath  should help this).  If you you experience any of these symptoms let us know on                your return visit.  5)  Report any of the following: any acute discomfort, severe headache, or temperature        greater than 100.5 F.   Also report any unusual redness, swelling, drainage, or pain         at your IV site.    You may report Symptoms to:  [facility]          Phone: [phone], ECT Department           or Dr. Shary Keylapac's office [phone]  6)  Your next ECT Treatment is Day Monday  Date June 11 at 830am  We will call 2 days prior to your scheduled appointment for arrival times.  7)  Nothing to eat or drink after midnight the night before your procedure.  8)  Take .     With a sip of water the morning of your procedure.  9)  Other Instructions: Call [phone] to cancel the morning of your procedure due         to illness or emergency.  10) We will call within 72 hours to assess how you are feeling.

## 2017-02-28 ENCOUNTER — Encounter: Payer: Self-pay | Admitting: Anesthesiology

## 2017-02-28 ENCOUNTER — Other Ambulatory Visit: Payer: Self-pay | Admitting: Psychiatry

## 2017-02-28 ENCOUNTER — Encounter (HOSPITAL_BASED_OUTPATIENT_CLINIC_OR_DEPARTMENT_OTHER)
Admission: RE | Admit: 2017-02-28 | Discharge: 2017-02-28 | Disposition: A | Discharge status: Home / Self Care | Payer: BLUE CROSS/BLUE SHIELD | Source: Ambulatory Visit | Attending: Psychiatry | Admitting: Psychiatry

## 2017-02-28 DIAGNOSIS — F3130 Bipolar disorder, current episode depressed, mild or moderate severity, unspecified: Secondary | ICD-10-CM

## 2017-02-28 DIAGNOSIS — F319 Bipolar disorder, unspecified: Secondary | ICD-10-CM | POA: Diagnosis not present

## 2017-02-28 MED ORDER — KETOROLAC TROMETHAMINE 30 MG/ML IJ SOLN
INTRAMUSCULAR | Status: AC
  Filled 2017-02-28: qty 1

## 2017-02-28 MED ORDER — METHOHEXITAL SODIUM 100 MG/10ML IV SOSY
PREFILLED_SYRINGE | INTRAVENOUS | Status: DC | PRN
Start: 2017-02-28 — End: 2017-02-28
  Administered 2017-02-28: 120 mg via INTRAVENOUS

## 2017-02-28 MED ORDER — SUCCINYLCHOLINE CHLORIDE 20 MG/ML IJ SOLN
INTRAMUSCULAR | Status: AC
  Filled 2017-02-28: qty 1

## 2017-02-28 MED ORDER — MIDAZOLAM HCL 2 MG/2ML IJ SOLN: 2.0000 mg | Freq: Once | INTRAMUSCULAR | Status: DC

## 2017-02-28 MED ORDER — KETOROLAC TROMETHAMINE 30 MG/ML IJ SOLN
30.0000 mg | Freq: Once | INTRAMUSCULAR | Status: AC
  Administered 2017-02-28: 30 mg via INTRAVENOUS

## 2017-02-28 MED ORDER — SODIUM CHLORIDE 0.9 % IV SOLN
INTRAVENOUS | Status: DC | PRN
  Administered 2017-02-28: 10:00:00 via INTRAVENOUS

## 2017-02-28 MED ORDER — SUCCINYLCHOLINE CHLORIDE 20 MG/ML IJ SOLN
INTRAMUSCULAR | Status: DC | PRN
  Administered 2017-02-28: 150 mg via INTRAVENOUS

## 2017-02-28 MED ORDER — METHOHEXITAL SODIUM 0.5 G IJ SOLR
INTRAMUSCULAR | Status: AC
  Filled 2017-02-28: qty 500

## 2017-02-28 MED ORDER — MIDAZOLAM HCL 2 MG/2ML IJ SOLN
INTRAMUSCULAR | Status: DC | PRN
  Administered 2017-02-28: 2 mg via INTRAVENOUS

## 2017-02-28 MED ORDER — SODIUM CHLORIDE 0.9 % IV SOLN
500.0000 mL | Freq: Once | INTRAVENOUS | Status: AC
  Administered 2017-02-28: 500 mL via INTRAVENOUS

## 2017-02-28 MED ORDER — MIDAZOLAM HCL 2 MG/2ML IJ SOLN
INTRAMUSCULAR | Status: AC
  Filled 2017-02-28: qty 2

## 2017-02-28 NOTE — Procedures (Signed)
ECT SERVICES Physician's Interval Evaluation & Treatment Note  Patient Identification: Bing NeighborsCharles A Melberg MRN:  [id number] Date of Evaluation:  02/28/2017 TX #: 4  MADRS: 30  MMSE: 30  P.E. Findings:  Vitals unremarkable heart and lungs normal no other findings  Psychiatric Interval Note:  Mood claimed to be a little better affect appears about the same. Doesn't appear to be psychotic or confused today  Subjective:  Patient is a 26 y.o. male seen for evaluation for Electroconvulsive Therapy. Complains of some confusion the day after treatment  Treatment Summary:   [x]   Right Unilateral             []  Bilateral   % Energy : 0.3 ms 40%   Impedance: 1510 ohms  Seizure Energy Index: 6779 V squared  Postictal Suppression Index: 76%  Seizure Concordance Index: 74%  Medications  Pre Shock: Toradol 30 mg Brevital 120 mg succinylcholine 120 mg  Post Shock:    Seizure Duration: 19 seconds by EMG 50 seconds by EEG   Comments: Hard to tell that there is been much change for the better. Continue right unilateral at least Wednesday and then reassess   Lungs:  [x]   Clear to auscultation               []  Other:   Heart:    [x]   Regular rhythm             []  irregular rhythm    [x]   Previous H&P reviewed, patient examined and there are NO CHANGES                 []   Previous H&P reviewed, patient examined and there are changes noted.   Mordecai RasmussenJohn Clapacs, MD 6/11/201810:22 AM

## 2017-02-28 NOTE — Anesthesia Procedure Notes (Signed)
Date/Time: 02/28/2017 10:31 AM Performed by: Marlana SalvageJESSUP, Isaiah Palmero Pre-anesthesia Checklist: Patient identified, Emergency Drugs available, Suction available, Patient being monitored and Timeout performed Patient Re-evaluated:Patient Re-evaluated prior to inductionOxygen Delivery Method: Circle system utilized Preoxygenation: Pre-oxygenation with 100% oxygen Intubation Type: IV induction Ventilation: Mask ventilation without difficulty and Mask ventilation throughout procedure Placement Confirmation: positive ETCO2 Dental Injury: Teeth and Oropharynx as per pre-operative assessment

## 2017-02-28 NOTE — H&P (Signed)
Isaiah NeighborsCharles A Garza is an 26 y.o. male.   Chief Complaint: Overall says he feels slightly better although he has days of confusion after the treatment HPI: History of mood disorder past psychotic disorder continued complaints of depression  Past Medical History:  Diagnosis Date  . ADD (attention deficit disorder)   . ADHD (attention deficit hyperactivity disorder)   . Allergy   . Anxiety   . Arthritis    neck  . Asthma   . Bipolar 1 disorder (HCC)   . Depression   . Pulmonary embolism (HCC) 10/2016    Past Surgical History:  Procedure Laterality Date  . NO PAST SURGERIES      Family History  Problem Relation Age of Onset  . Cancer Father        testicular  . ADD / ADHD Father   . Depression Father   . Alcohol abuse Mother   . Depression Mother   . Anxiety disorder Sister   . Bipolar disorder Maternal Grandmother   . Dementia Maternal Grandmother   . Bipolar disorder Maternal Aunt   . Bipolar disorder Cousin   . Diabetes Neg Hx   . Heart disease Neg Hx   . Early death Neg Hx   . Stroke Neg Hx    Social History:  reports that he has never smoked. He has never used smokeless tobacco. He reports that he drinks alcohol. He reports that he does not use drugs.  Allergies: No Known Allergies   (Not in a hospital admission)  No results found for this or any previous visit (from the past 48 hour(s)). No results found.  Review of Systems  Constitutional: Negative.   HENT: Negative.   Eyes: Negative.   Respiratory: Negative.   Cardiovascular: Negative.   Gastrointestinal: Negative.   Musculoskeletal: Negative.   Skin: Negative.   Neurological: Negative.   Psychiatric/Behavioral: Negative.     Blood pressure 118/81, pulse 74, temperature 97.7 F (36.5 C), temperature source Oral, resp. rate 18, height 6\' 3"  (1.905 m), weight 71.2 kg (157 lb), SpO2 98 %. Physical Exam  Nursing note and vitals reviewed. Constitutional: He appears well-developed and well-nourished.   HENT:  Head: Normocephalic and atraumatic.  Eyes: Conjunctivae are normal. Pupils are equal, round, and reactive to light.  Neck: Normal range of motion.  Cardiovascular: Regular rhythm and normal heart sounds.   Respiratory: Effort normal. No respiratory distress.  GI: Soft.  Musculoskeletal: Normal range of motion.  Neurological: He is alert.  Skin: Skin is warm and dry.  Psychiatric: He has a normal mood and affect. His behavior is normal. Judgment and thought content normal.     Assessment/Plan Treatment today and continue into the remainder of the week to see if were seeing some clear improvement  Mordecai RasmussenJohn Caralynn Gelber, MD 02/28/2017, 10:21 AM

## 2017-02-28 NOTE — Anesthesia Postprocedure Evaluation (Signed)
Anesthesia Post Note  Patient: Isaiah Garza  Procedure(s) Performed: * No procedures listed *  Patient location during evaluation: PACU Anesthesia Type: General Level of consciousness: awake and alert Pain management: pain level controlled Vital Signs Assessment: post-procedure vital signs reviewed and stable Respiratory status: spontaneous breathing, nonlabored ventilation and respiratory function stable Cardiovascular status: blood pressure returned to baseline and stable Postop Assessment: no signs of nausea or vomiting Anesthetic complications: no     Last Vitals:  Vitals:   02/28/17 1059 02/28/17 1110  BP: 111/71 107/71  Pulse: 76 76  Resp: (!) 22 18  Temp: 36.8 C     Last Pain:  Vitals:   02/28/17 1059  TempSrc:   PainSc: 0-No pain                 Donell Tomkins

## 2017-02-28 NOTE — Discharge Instructions (Signed)
1)  The drugs that you have been given will stay in your system until tomorrow so for the       next 24 hours you should not:  A. Drive an automobile  B. Make any legal decisions  C. Drink any alcoholic beverages  2)  You may resume your regular meals upon return home.  3)  A responsible adult must take you home.  Someone should stay with you for a few          hours, then be available by phone for the remainder of the treatment day.  4)  You May experience any of the following symptoms:  Headache, Nausea and a dry mouth (due to the medications you were given),  temporary memory loss and some confusion, or sore muscles (a warm bath  should help this).  If you you experience any of these symptoms let us know on                your return visit.  5)  Report any of the following: any acute discomfort, severe headache, or temperature        greater than 100.5 F.   Also report any unusual redness, swelling, drainage, or pain         at your IV site.    You may report Symptoms to:  ECT PROGRAM- [facility]          Phone: [phone], ECT Department           or Dr. Shary Keylapac's office [phone]  6)  Your next ECT Treatment is Day Wednesday  Date March 01, 2017 830am  We will call 2 days prior to your scheduled appointment for arrival times.  7)  Nothing to eat or drink after midnight the night before your procedure.  8)  Take .     With a sip of water the morning of your procedure.  9)  Other Instructions: Call [phone] to cancel the morning of your procedure due         to illness or emergency.  10) We will call within 72 hours to assess how you are feeling.

## 2017-02-28 NOTE — Anesthesia Preprocedure Evaluation (Signed)
Anesthesia Evaluation  Patient identified by MRN, date of birth, ID band Patient awake    Reviewed: Allergy & Precautions, H&P , NPO status , Patient's Chart, lab work & pertinent test results, reviewed documented beta blocker date and time   Airway Mallampati: II   Neck ROM: full    Dental  (+) Poor Dentition   Pulmonary asthma , neg sleep apnea,    Pulmonary exam normal breath sounds clear to auscultation- rhonchi (-) wheezing      Cardiovascular Exercise Tolerance: Good (-) hypertension(-) CAD and (-) Past MI negative cardio ROS Normal cardiovascular exam Rhythm:regular Rate:Normal - Systolic murmurs and - Diastolic murmurs    Neuro/Psych PSYCHIATRIC DISORDERS Anxiety Depression Bipolar Disorder negative neurological ROS  negative psych ROS   GI/Hepatic negative GI ROS, Neg liver ROS,   Endo/Other  negative endocrine ROSneg diabetes  Renal/GU negative Renal ROS  negative genitourinary   Musculoskeletal  (+) Arthritis ,   Abdominal (+) - obese,   Peds  Hematology negative hematology ROS (+)   Anesthesia Other Findings Past Medical History: No date: ADD (attention deficit disorder) No date: ADHD (attention deficit hyperactivity disorder) No date: Allergy No date: Anxiety No date: Arthritis     Comment: neck No date: Asthma No date: Bipolar 1 disorder (HCC) No date: Depression 10/2016: Pulmonary embolism (HCC) Past Surgical History: No date: NO PAST SURGERIES BMI    Body Mass Index:  19.75 kg/m     Reproductive/Obstetrics negative OB ROS                             Anesthesia Physical  Anesthesia Plan  ASA: II  Anesthesia Plan: General   Post-op Pain Management:    Induction: Intravenous  PONV Risk Score and Plan: 1 and Ondansetron  Airway Management Planned: Mask  Additional Equipment:   Intra-op Plan:   Post-operative Plan:   Informed Consent: I have  reviewed the patients History and Physical, chart, labs and discussed the procedure including the risks, benefits and alternatives for the proposed anesthesia with the patient or authorized representative who has indicated his/her understanding and acceptance.   Dental Advisory Given  Plan Discussed with: CRNA and Anesthesiologist  Anesthesia Plan Comments:         Anesthesia Quick Evaluation

## 2017-02-28 NOTE — Transfer of Care (Signed)
Immediate Anesthesia Transfer of Care Note  Patient: Bing Neighborsharles A Zellars  Procedure(s) Performed: * No procedures listed *  Patient Location: PACU  Anesthesia Type:General  Level of Consciousness: awake and patient cooperative  Airway & Oxygen Therapy: Patient Spontanous Breathing and Patient connected to face mask oxygen  Post-op Assessment: Report given to RN and Post -op Vital signs reviewed and stable  Post vital signs: Reviewed and stable  Last Vitals:  Vitals:   02/28/17 0832 02/28/17 1039  BP: 118/81 135/82  Pulse: 74 80  Resp: 18 17  Temp: 36.5 C 36.9 C    Last Pain:  Vitals:   02/28/17 1039  TempSrc: Temporal  PainSc: 0-No pain         Complications: No apparent anesthesia complications

## 2017-02-28 NOTE — Anesthesia Post-op Follow-up Note (Cosign Needed)
Anesthesia QCDR form completed.        

## 2017-03-02 ENCOUNTER — Encounter: Payer: Self-pay | Admitting: Anesthesiology

## 2017-03-02 ENCOUNTER — Ambulatory Visit
Admission: RE | Admit: 2017-03-02 | Discharge: 2017-03-02 | Disposition: A | Discharge status: Home / Self Care | Payer: BLUE CROSS/BLUE SHIELD | Source: Ambulatory Visit | Attending: Psychiatry | Admitting: Psychiatry

## 2017-03-02 ENCOUNTER — Encounter (HOSPITAL_COMMUNITY): Payer: Self-pay | Admitting: Psychiatry

## 2017-03-02 ENCOUNTER — Other Ambulatory Visit: Payer: Self-pay | Admitting: Psychiatry

## 2017-03-02 ENCOUNTER — Other Ambulatory Visit (HOSPITAL_COMMUNITY): Payer: Self-pay | Admitting: Psychiatry

## 2017-03-02 DIAGNOSIS — F319 Bipolar disorder, unspecified: Secondary | ICD-10-CM | POA: Diagnosis not present

## 2017-03-02 DIAGNOSIS — F419 Anxiety disorder, unspecified: Secondary | ICD-10-CM | POA: Diagnosis not present

## 2017-03-02 DIAGNOSIS — F3130 Bipolar disorder, current episode depressed, mild or moderate severity, unspecified: Secondary | ICD-10-CM | POA: Diagnosis not present

## 2017-03-02 DIAGNOSIS — F909 Attention-deficit hyperactivity disorder, unspecified type: Secondary | ICD-10-CM | POA: Insufficient documentation

## 2017-03-02 MED ORDER — MIDAZOLAM HCL 2 MG/2ML IJ SOLN: 2.0000 mg | Freq: Once | INTRAMUSCULAR | Status: DC

## 2017-03-02 MED ORDER — SUCCINYLCHOLINE CHLORIDE 200 MG/10ML IV SOSY
PREFILLED_SYRINGE | INTRAVENOUS | Status: DC | PRN
  Administered 2017-03-02: 150 mg via INTRAVENOUS

## 2017-03-02 MED ORDER — BUPROPION HCL ER (XL) 300 MG PO TB24: 300.0000 mg | ORAL_TABLET | ORAL | 1 refills | Status: DC

## 2017-03-02 MED ORDER — SUCCINYLCHOLINE CHLORIDE 20 MG/ML IJ SOLN
INTRAMUSCULAR | Status: AC
  Filled 2017-03-02: qty 1

## 2017-03-02 MED ORDER — METHOHEXITAL SODIUM 100 MG/10ML IV SOSY
PREFILLED_SYRINGE | INTRAVENOUS | Status: DC | PRN
  Administered 2017-03-02: 120 mg via INTRAVENOUS

## 2017-03-02 MED ORDER — MIDAZOLAM HCL 2 MG/2ML IJ SOLN
INTRAMUSCULAR | Status: AC
  Filled 2017-03-02: qty 2

## 2017-03-02 MED ORDER — KETOROLAC TROMETHAMINE 30 MG/ML IJ SOLN
INTRAMUSCULAR | Status: AC
  Filled 2017-03-02: qty 1

## 2017-03-02 MED ORDER — MIDAZOLAM HCL 2 MG/2ML IJ SOLN
INTRAMUSCULAR | Status: DC | PRN
  Administered 2017-03-02: 2 mg via INTRAVENOUS

## 2017-03-02 MED ORDER — KETOROLAC TROMETHAMINE 30 MG/ML IJ SOLN
30.0000 mg | Freq: Once | INTRAMUSCULAR | Status: AC
  Administered 2017-03-02: 30 mg via INTRAVENOUS

## 2017-03-02 MED ORDER — SODIUM CHLORIDE 0.9 % IV SOLN
500.0000 mL | Freq: Once | INTRAVENOUS | Status: AC
  Administered 2017-03-02: 500 mL via INTRAVENOUS

## 2017-03-02 MED ORDER — SODIUM CHLORIDE 0.9 % IV SOLN
INTRAVENOUS | Status: DC | PRN
  Administered 2017-03-02: 10:00:00 via INTRAVENOUS

## 2017-03-02 NOTE — Discharge Instructions (Signed)
1)  The drugs that you have been given will stay in your system until tomorrow so for the       next 24 hours you should not:  A. Drive an automobile  B. Make any legal decisions  C. Drink any alcoholic beverages  2)  You may resume your regular meals upon return home.  3)  A responsible adult must take you home.  Someone should stay with you for a few          hours, then be available by phone for the remainder of the treatment day.  4)  You May experience any of the following symptoms:  Headache, Nausea and a dry mouth (due to the medications you were given),  temporary memory loss and some confusion, or sore muscles (a warm bath  should help this).  If you you experience any of these symptoms let us know on                your return visit.  5)  Report any of the following: any acute discomfort, severe headache, or temperature        greater than 100.5 F.   Also report any unusual redness, swelling, drainage, or pain         at your IV site.    You may report Symptoms to:  ECT PROGRAM- [place] [facility]          Phone: [phone], ECT Department           or Dr. Shary Keylapac's office [phone]  6)  Your next ECT Treatment is Friday June 15 at 0830  We will call 2 days prior to your scheduled appointment for arrival times.  7)  Nothing to eat or drink after midnight the night before your procedure.  8)  Take     With a sip of water the morning of your procedure.  9)  Other Instructions: Call [phone] to cancel the morning of your procedure due         to illness or emergency.  10) We will call within 72 hours to assess how you are feeling.

## 2017-03-02 NOTE — Anesthesia Postprocedure Evaluation (Signed)
Anesthesia Post Note  Patient: Isaiah Garza  Procedure(s) Performed: * No procedures listed *  Patient location during evaluation: PACU Anesthesia Type: General Level of consciousness: awake Pain management: pain level controlled Vital Signs Assessment: post-procedure vital signs reviewed and stable Respiratory status: spontaneous breathing Cardiovascular status: stable Anesthetic complications: no     Last Vitals:  Vitals:   03/02/17 1117 03/02/17 1133  BP: 118/70 114/68  Pulse: 84 84  Resp: 17 16  Temp: 37 C 37 C    Last Pain:  Vitals:   03/02/17 1133  TempSrc: Oral                 VAN STAVEREN,Gerasimos Plotts

## 2017-03-02 NOTE — Anesthesia Preprocedure Evaluation (Signed)
Anesthesia Evaluation  Patient identified by MRN, date of birth, ID band Patient awake    Reviewed: Allergy & Precautions, NPO status , Patient's Chart, lab work & pertinent test results  Airway Mallampati: I       Dental  (+) Teeth Intact   Pulmonary asthma ,    breath sounds clear to auscultation       Cardiovascular Exercise Tolerance: Good  Rhythm:Regular     Neuro/Psych Depression Bipolar Disorder    GI/Hepatic negative GI ROS, Neg liver ROS,   Endo/Other  negative endocrine ROS  Renal/GU negative Renal ROS     Musculoskeletal   Abdominal   Peds negative pediatric ROS (+)  Hematology negative hematology ROS (+)   Anesthesia Other Findings   Reproductive/Obstetrics                             Anesthesia Physical Anesthesia Plan  ASA: I  Anesthesia Plan: General   Post-op Pain Management:    Induction: Intravenous  PONV Risk Score and Plan: 0  Airway Management Planned: Natural Airway and Mask  Additional Equipment:   Intra-op Plan:   Post-operative Plan:   Informed Consent: I have reviewed the patients History and Physical, chart, labs and discussed the procedure including the risks, benefits and alternatives for the proposed anesthesia with the patient or authorized representative who has indicated his/her understanding and acceptance.     Plan Discussed with: CRNA and Anesthesiologist  Anesthesia Plan Comments:         Anesthesia Quick Evaluation

## 2017-03-02 NOTE — Procedures (Signed)
ECT SERVICES Physician's Interval Evaluation & Treatment Note  Patient Identification: Isaiah NeighborsCharles A Garza MRN:  [id number] Date of Evaluation:  03/02/2017 TX #: 5  MADRS:   MMSE:   P.E. Findings:  No change to physical exam  Psychiatric Interval Note:  Mood reported as still being depressed although he thinks he may have a little bit of improvement  Subjective:  Patient is a 26 y.o. male seen for evaluation for Electroconvulsive Therapy. No specific new complaint  Treatment Summary:   [x]   Right Unilateral             []  Bilateral   % Energy : 0.3 ms 50%   Impedance: 1530 ohms  Seizure Energy Index: 14,702 V squared  Postictal Suppression Index: 79%  Seizure Concordance Index: 96%  Medications  Pre Shock: Toradol 30 mg Brevital 120 mg succinylcholine 120 mg  Post Shock:    Seizure Duration: 36 seconds by EMG 72 seconds by EEG   Comments: Follow-up Friday after which I'm suggesting we consider applause   Lungs:  [x]   Clear to auscultation               []  Other:   Heart:    [x]   Regular rhythm             []  irregular rhythm    [x]   Previous H&P reviewed, patient examined and there are NO CHANGES                 []   Previous H&P reviewed, patient examined and there are changes noted.   Mordecai RasmussenJohn Marcile Fuquay, MD 6/13/201810:38 AM

## 2017-03-02 NOTE — Anesthesia Post-op Follow-up Note (Cosign Needed)
Anesthesia QCDR form completed.        

## 2017-03-02 NOTE — H&P (Signed)
Isaiah NeighborsCharles A Garza is an 26 y.o. male.   Chief Complaint: Mood feels like it's a little bit better. Still feels groggy and confused on alternate days HPI: Patient with history of mood disorder with psychotic features and now chronic depression  Past Medical History:  Diagnosis Date  . ADD (attention deficit disorder)   . ADHD (attention deficit hyperactivity disorder)   . Allergy   . Anxiety   . Arthritis    neck  . Asthma   . Bipolar 1 disorder (HCC)   . Depression   . Pulmonary embolism (HCC) 10/2016    Past Surgical History:  Procedure Laterality Date  . NO PAST SURGERIES      Family History  Problem Relation Age of Onset  . Cancer Father        testicular  . ADD / ADHD Father   . Depression Father   . Alcohol abuse Mother   . Depression Mother   . Anxiety disorder Sister   . Bipolar disorder Maternal Grandmother   . Dementia Maternal Grandmother   . Bipolar disorder Maternal Aunt   . Bipolar disorder Cousin   . Diabetes Neg Hx   . Heart disease Neg Hx   . Early death Neg Hx   . Stroke Neg Hx    Social History:  reports that he has never smoked. He has never used smokeless tobacco. He reports that he drinks alcohol. He reports that he does not use drugs.  Allergies: No Known Allergies   (Not in a hospital admission)  No results found for this or any previous visit (from the past 48 hour(s)). No results found.  Review of Systems  Constitutional: Negative.   HENT: Negative.   Eyes: Negative.   Respiratory: Negative.   Cardiovascular: Negative.   Gastrointestinal: Negative.   Musculoskeletal: Negative.   Skin: Negative.   Neurological: Negative.   Psychiatric/Behavioral: Negative.     Blood pressure 122/74, pulse 72, temperature 97.9 F (36.6 C), temperature source Oral, resp. rate 18, height 6\' 3"  (1.905 m), weight 70.3 kg (155 lb), SpO2 99 %. Physical Exam  Nursing note and vitals reviewed. Constitutional: He appears well-developed and well-nourished.   HENT:  Head: Normocephalic and atraumatic.  Eyes: Conjunctivae are normal. Pupils are equal, round, and reactive to light.  Neck: Normal range of motion.  Cardiovascular: Regular rhythm and normal heart sounds.   Respiratory: Effort normal. No respiratory distress.  GI: Soft.  Musculoskeletal: Normal range of motion.  Neurological: He is alert.  Skin: Skin is warm and dry.  Psychiatric: He has a normal mood and affect. His behavior is normal. Judgment and thought content normal.     Assessment/Plan Continue with treatment through Friday at which point I am suggesting we take a weeks pause to reevaluate  Isaiah RasmussenJohn Clapacs, MD 03/02/2017, 10:36 AM

## 2017-03-02 NOTE — Anesthesia Procedure Notes (Signed)
Date/Time: 03/02/2017 10:45 AM Performed by: Lily KocherPERALTA, Sadeen Wiegel Pre-anesthesia Checklist: Patient identified, Emergency Drugs available, Suction available and Patient being monitored Patient Re-evaluated:Patient Re-evaluated prior to inductionOxygen Delivery Method: Circle system utilized Preoxygenation: Pre-oxygenation with 100% oxygen Intubation Type: IV induction Ventilation: Mask ventilation without difficulty and Mask ventilation throughout procedure Airway Equipment and Method: Bite block Placement Confirmation: positive ETCO2 Dental Injury: Teeth and Oropharynx as per pre-operative assessment

## 2017-03-02 NOTE — Transfer of Care (Signed)
Immediate Anesthesia Transfer of Care Note  Patient: Isaiah Garza  Procedure(s) Performed: ECT  Patient Location: PACU  Anesthesia Type:General  Level of Consciousness: sedated  Airway & Oxygen Therapy: Patient Spontanous Breathing  Post-op Assessment: Report given to RN and Post -op Vital signs reviewed and stable  Post vital signs: Reviewed and stable  Last Vitals:  Vitals:   03/02/17 0856 03/02/17 1057  BP: 122/74 123/75  Pulse: 72 90  Resp: 18 (!) 22  Temp: 36.6 C 37 C    Last Pain:  Vitals:   03/02/17 0856  TempSrc: Oral         Complications: No apparent anesthesia complications

## 2017-03-03 ENCOUNTER — Other Ambulatory Visit: Payer: Self-pay | Admitting: Psychiatry

## 2017-03-03 ENCOUNTER — Ambulatory Visit (HOSPITAL_COMMUNITY): Payer: Self-pay | Admitting: Psychiatry

## 2017-03-04 ENCOUNTER — Encounter: Payer: Self-pay | Admitting: Anesthesiology

## 2017-03-04 ENCOUNTER — Encounter (HOSPITAL_BASED_OUTPATIENT_CLINIC_OR_DEPARTMENT_OTHER)
Admission: RE | Admit: 2017-03-04 | Discharge: 2017-03-04 | Disposition: A | Discharge status: Home / Self Care | Payer: BLUE CROSS/BLUE SHIELD | Source: Ambulatory Visit | Attending: Psychiatry | Admitting: Psychiatry

## 2017-03-04 DIAGNOSIS — F3130 Bipolar disorder, current episode depressed, mild or moderate severity, unspecified: Secondary | ICD-10-CM

## 2017-03-04 DIAGNOSIS — F319 Bipolar disorder, unspecified: Secondary | ICD-10-CM | POA: Diagnosis not present

## 2017-03-04 MED ORDER — SUCCINYLCHOLINE CHLORIDE 200 MG/10ML IV SOSY
PREFILLED_SYRINGE | INTRAVENOUS | Status: DC | PRN
  Administered 2017-03-04: 150 mg via INTRAVENOUS

## 2017-03-04 MED ORDER — SODIUM CHLORIDE 0.9 % IV SOLN
INTRAVENOUS | Status: DC | PRN
  Administered 2017-03-04: 10:00:00 via INTRAVENOUS

## 2017-03-04 MED ORDER — ONDANSETRON HCL 4 MG/2ML IJ SOLN: 4.0000 mg | Freq: Once | INTRAMUSCULAR | Status: DC | PRN

## 2017-03-04 MED ORDER — KETOROLAC TROMETHAMINE 30 MG/ML IJ SOLN
INTRAMUSCULAR | Status: AC
  Administered 2017-03-04: 30 mg via INTRAVENOUS
  Filled 2017-03-04: qty 1

## 2017-03-04 MED ORDER — MIDAZOLAM HCL 2 MG/2ML IJ SOLN
INTRAMUSCULAR | Status: DC | PRN
  Administered 2017-03-04: 2 mg via INTRAVENOUS

## 2017-03-04 MED ORDER — FENTANYL CITRATE (PF) 100 MCG/2ML IJ SOLN: 25.0000 ug | INTRAMUSCULAR | Status: DC | PRN

## 2017-03-04 MED ORDER — KETOROLAC TROMETHAMINE 30 MG/ML IJ SOLN
30.0000 mg | Freq: Once | INTRAMUSCULAR | Status: AC
  Administered 2017-03-04: 30 mg via INTRAVENOUS

## 2017-03-04 MED ORDER — SODIUM CHLORIDE 0.9 % IV SOLN
500.0000 mL | Freq: Once | INTRAVENOUS | Status: AC
  Administered 2017-03-04: 500 mL via INTRAVENOUS

## 2017-03-04 MED ORDER — METHOHEXITAL SODIUM 100 MG/10ML IV SOSY
PREFILLED_SYRINGE | INTRAVENOUS | Status: DC | PRN
  Administered 2017-03-04: 120 mg via INTRAVENOUS

## 2017-03-04 MED ORDER — MIDAZOLAM HCL 2 MG/2ML IJ SOLN
INTRAMUSCULAR | Status: AC
  Filled 2017-03-04: qty 2

## 2017-03-04 MED ORDER — SUCCINYLCHOLINE CHLORIDE 20 MG/ML IJ SOLN
INTRAMUSCULAR | Status: AC
  Filled 2017-03-04: qty 1

## 2017-03-04 NOTE — Anesthesia Procedure Notes (Signed)
Date/Time: 03/04/2017 10:44 AM Performed by: Lily KocherPERALTA, Isaiah Bochenek Pre-anesthesia Checklist: Patient identified, Emergency Drugs available, Suction available and Patient being monitored Patient Re-evaluated:Patient Re-evaluated prior to inductionOxygen Delivery Method: Circle system utilized Preoxygenation: Pre-oxygenation with 100% oxygen Intubation Type: IV induction Ventilation: Mask ventilation without difficulty and Mask ventilation throughout procedure Airway Equipment and Method: Bite block Placement Confirmation: positive ETCO2 Dental Injury: Teeth and Oropharynx as per pre-operative assessment

## 2017-03-04 NOTE — Anesthesia Postprocedure Evaluation (Signed)
Anesthesia Post Note  Patient: Isaiah Garza  Procedure(s) Performed: * No procedures listed *  Patient location during evaluation: PACU Anesthesia Type: General Level of consciousness: awake and alert and oriented Pain management: pain level controlled Vital Signs Assessment: post-procedure vital signs reviewed and stable Respiratory status: spontaneous breathing Cardiovascular status: blood pressure returned to baseline Anesthetic complications: no     Last Vitals:  Vitals:   03/04/17 1123 03/04/17 1129  BP: 123/72 112/73  Pulse: 84 (!) 4  Resp: 16 16  Temp:      Last Pain:  Vitals:   03/04/17 1115  TempSrc:   PainSc: 3                  Ratasha Fabre

## 2017-03-04 NOTE — Transfer of Care (Addendum)
Immediate Anesthesia Transfer of Care Note  Patient: Isaiah Garza  Procedure(s) Performed: ECT  Patient Location: PACU  Anesthesia Type:General  Level of Consciousness: sedated  Airway & Oxygen Therapy: Patient Spontanous Breathing  Post-op Assessment: Report given to RN and Post -op Vital signs reviewed and stable  Post vital signs: Reviewed and stable  Last Vitals:  Vitals:   03/04/17 0836 03/04/17 1053  BP: 118/76 127/76  Pulse: 66 87  Resp: 16 18  Temp: 36.4 C 36.6 C    Last Pain:  Vitals:   03/04/17 1053  TempSrc:   PainSc: Asleep         Complications: No apparent anesthesia complications

## 2017-03-04 NOTE — Discharge Instructions (Signed)
1)  The drugs that you have been given will stay in your system until tomorrow so for the       next 24 hours you should not:  A. Drive an automobile  B. Make any legal decisions  C. Drink any alcoholic beverages  2)  You may resume your regular meals upon return home.  3)  A responsible adult must take you home.  Someone should stay with you for a few          hours, then be available by phone for the remainder of the treatment day.  4)  You May experience any of the following symptoms:  Headache, Nausea and a dry mouth (due to the medications you were given),  temporary memory loss and some confusion, or sore muscles (a warm bath  should help this).  If you you experience any of these symptoms let us know on                your return visit.  5)  Report any of the following: any acute discomfort, severe headache, or temperature        greater than 100.5 F.   Also report any unusual redness, swelling, drainage, or pain         at your IV site.    You may report Symptoms to:  ECT PROGRAM- [place] [facility]          Phone: [phone], ECT Department           or Dr. Shary Keylapac's office [phone]  6)  Your next ECT Treatment is Day Friday  Date June 22 at 830  We will call 2 days prior to your scheduled appointment for arrival times.  7)  Nothing to eat or drink after midnight the night before your procedure.  8)  Take .    With a sip of water the morning of your procedure.  9)  Other Instructions: Call [phone] to cancel the morning of your procedure due         to illness or emergency.  10) We will call within 72 hours to assess how you are feeling.

## 2017-03-04 NOTE — H&P (Signed)
Isaiah NeighborsCharles A Garza is an [age] y.o. male.   Chief Complaint: Patient has no specific new complaint. Mood is feeling a little bit better. Not having any active suicidal thoughts HPI: Patient with a history of a severe mood episode and then persistently low mood with current ECT to try and treat depression  Past Medical History:  Diagnosis Date  . ADD (attention deficit disorder)   . ADHD (attention deficit hyperactivity disorder)   . Allergy   . Anxiety   . Arthritis    neck  . Asthma   . Bipolar 1 disorder (HCC)   . Depression   . Pulmonary embolism (HCC) 10/2016    Past Surgical History:  Procedure Laterality Date  . NO PAST SURGERIES      Family History  Problem Relation Age of Onset  . Cancer Father        testicular  . ADD / ADHD Father   . Depression Father   . Alcohol abuse Mother   . Depression Mother   . Anxiety disorder Sister   . Bipolar disorder Maternal Grandmother   . Dementia Maternal Grandmother   . Bipolar disorder Maternal Aunt   . Bipolar disorder Cousin   . Diabetes Neg Hx   . Heart disease Neg Hx   . Early death Neg Hx   . Stroke Neg Hx    Social History:  reports that he has never smoked. He has never used smokeless tobacco. He reports that he drinks alcohol. He reports that he does not use drugs.  Allergies: No Known Allergies   (Not in a hospital admission)  No results found for this or any previous visit (from the past 48 hour(s)). No results found.  Review of Systems  Constitutional: Negative.   HENT: Negative.   Eyes: Negative.   Respiratory: Negative.   Cardiovascular: Negative.   Gastrointestinal: Negative.   Musculoskeletal: Negative.   Skin: Negative.   Neurological: Negative.   Psychiatric/Behavioral: Negative.     Blood pressure 118/76, pulse 66, temperature 97.5 F (36.4 C), temperature source Oral, resp. rate 16, height 6\' 3"  (1.905 m), weight 71.2 kg (157 lb), SpO2 100 %. Physical Exam  Nursing note and vitals  reviewed. Constitutional: He appears well-developed and well-nourished.  HENT:  Head: Normocephalic and atraumatic.  Eyes: Conjunctivae are normal. Pupils are equal, round, and reactive to light.  Neck: Normal range of motion.  Cardiovascular: Regular rhythm and normal heart sounds.   Respiratory: Effort normal. No respiratory distress.  GI: Soft.  Musculoskeletal: Normal range of motion.  Neurological: He is alert.  Skin: Skin is warm and dry.  Psychiatric: He has a normal mood and affect. His speech is normal and behavior is normal. Judgment and thought content normal. Cognition and memory are normal.     Assessment/Plan We are going to stop with the index course for the next week to give his memory time to recover. Follow-up one week.  Mordecai RasmussenJohn Clapacs, MD 03/04/2017, 10:37 AM

## 2017-03-04 NOTE — Anesthesia Post-op Follow-up Note (Cosign Needed)
Anesthesia QCDR form completed.        

## 2017-03-04 NOTE — Procedures (Signed)
ECT SERVICES Physician's Interval Evaluation & Treatment Note  Patient Identification: Isaiah NeighborsCharles A Burdick MRN:  [id number] Date of Evaluation:  03/04/2017 TX #: 6  MADRS:   MMSE:   P.E. Findings:  No change to physical exam vitals normal heart and lungs normal  Psychiatric Interval Note:  Affect seemed relaxed and upbeat calm  Subjective:  Patient is a 26 y.o. male seen for evaluation for Electroconvulsive Therapy. Some memory loss  Treatment Summary:   [x]   Right Unilateral             []  Bilateral   % Energy : 0.3 ms 50%   Impedance: 1660 ohms  Seizure Energy Index: 12,529 V squared  Postictal Suppression Index: No reading  Seizure Concordance Index: 91%  Medications  Pre Shock: Toradol 30 mg Brevital 120 mg succinylcholine 120 mg  Post Shock:    Seizure Duration: 27 seconds by EMG 56 seconds by EEG   Comments: We are going to take a one-week break to allow him to recover memory and cognitive function and then see him back Friday the 22nd   Lungs:  [x]   Clear to auscultation               []  Other:   Heart:    [x]   Regular rhythm             []  irregular rhythm    [x]   Previous H&P reviewed, patient examined and there are NO CHANGES                 []   Previous H&P reviewed, patient examined and there are changes noted.   Mordecai RasmussenJohn Val Schiavo, MD 6/15/201810:39 AM

## 2017-03-04 NOTE — Anesthesia Preprocedure Evaluation (Signed)
Anesthesia Evaluation  Patient identified by MRN, date of birth, ID band Patient awake    Reviewed: Allergy & Precautions, NPO status , Patient's Chart, lab work & pertinent test results  Airway Mallampati: I       Dental  (+) Teeth Intact   Pulmonary asthma ,    breath sounds clear to auscultation       Cardiovascular Exercise Tolerance: Good  Rhythm:Regular     Neuro/Psych PSYCHIATRIC DISORDERS Anxiety Depression Bipolar Disorder negative neurological ROS     GI/Hepatic negative GI ROS, Neg liver ROS,   Endo/Other  negative endocrine ROS  Renal/GU negative Renal ROS  negative genitourinary   Musculoskeletal  (+) Arthritis , Osteoarthritis,    Abdominal   Peds negative pediatric ROS (+)  Hematology negative hematology ROS (+)   Anesthesia Other Findings   Reproductive/Obstetrics                             Anesthesia Physical  Anesthesia Plan  ASA: II  Anesthesia Plan: General   Post-op Pain Management:    Induction: Intravenous  PONV Risk Score and Plan: 0  Airway Management Planned: Natural Airway and Mask  Additional Equipment:   Intra-op Plan:   Post-operative Plan:   Informed Consent: I have reviewed the patients History and Physical, chart, labs and discussed the procedure including the risks, benefits and alternatives for the proposed anesthesia with the patient or authorized representative who has indicated his/her understanding and acceptance.     Plan Discussed with: CRNA and Anesthesiologist  Anesthesia Plan Comments:         Anesthesia Quick Evaluation

## 2017-03-08 ENCOUNTER — Encounter (HOSPITAL_COMMUNITY): Payer: Self-pay | Admitting: Psychiatry

## 2017-03-09 ENCOUNTER — Encounter (HOSPITAL_COMMUNITY): Payer: Self-pay | Admitting: Psychiatry

## 2017-03-16 ENCOUNTER — Encounter (HOSPITAL_COMMUNITY): Payer: Self-pay | Admitting: Psychiatry

## 2017-03-16 ENCOUNTER — Ambulatory Visit (INDEPENDENT_AMBULATORY_CARE_PROVIDER_SITE_OTHER): Payer: BLUE CROSS/BLUE SHIELD | Admitting: Psychiatry

## 2017-03-16 VITALS — BP 118/68 | HR 77 | Ht 75.0 in | Wt 155.2 lb

## 2017-03-16 DIAGNOSIS — Z811 Family history of alcohol abuse and dependence: Secondary | ICD-10-CM | POA: Diagnosis not present

## 2017-03-16 DIAGNOSIS — Z818 Family history of other mental and behavioral disorders: Secondary | ICD-10-CM

## 2017-03-16 DIAGNOSIS — F319 Bipolar disorder, unspecified: Secondary | ICD-10-CM

## 2017-03-16 DIAGNOSIS — Z79899 Other long term (current) drug therapy: Secondary | ICD-10-CM | POA: Diagnosis not present

## 2017-03-16 MED ORDER — TEMAZEPAM 15 MG PO CAPS: 15.0000 mg | ORAL_CAPSULE | Freq: Every day | ORAL | 1 refills | Status: DC

## 2017-03-16 MED ORDER — BUPROPION HCL ER (XL) 150 MG PO TB24: 150.0000 mg | ORAL_TABLET | Freq: Every day | ORAL | 1 refills | Status: DC

## 2017-03-16 MED ORDER — LAMOTRIGINE 200 MG PO TABS: 200.0000 mg | ORAL_TABLET | Freq: Every day | ORAL | 1 refills | Status: DC

## 2017-03-16 NOTE — Progress Notes (Signed)
BH MD/PA/NP OP Progress Note  03/16/2017 9:37 AM Isaiah Garza  MRN:  [id number]  Chief Complaint: med management  Subjective:  Isaiah Garza presents for med management follow-up. He recently completed index course of ECT.  He presents today with ongoing mild depressive symptoms, but denies any suicidality. He reports that he does have some improved outlook in life, and some increasing optimism. He is mostly recovering from some of the cognitive and physical effects of ECT, and I spent time encouraging him to increase his level of activation, exercise, socialization. We discussed some techniques to increase behavioral activation.  He reports that he has had some trouble sleeping at night, Gerome Apley due to the fact that he's been napping during the day. We discussed increasing his Restoril to 30 mg nightly, and also simultaneously increasing his Wellbutrin to 450 mg daily. Spent time reviewing the risks and benefits, including the risk of increased anxiety with Wellbutrin. If he notes increased anxiety or irritability, he is to discontinue the Wellbutrin. We discussed that it is been unclear if he has had benefit from Wellbutrin, so this will help [place] to determine whether or not Wellbutrin is adding value for his regimen.  We spent time discussing his Lamictal dosing, and he reports that he is increased it back to 200 mg daily as of 5 days ago. He has not had any intolerance or skin reaction. Discussed that this can take a few weeks to fully kick in terms of helping with his mood, and he understands this. We discussed that if he has mood stability with Lamictal and sleep stability with Restoril, we can begin to taper down Latuda given that this would be a contributing factor in his sedation and periodic lack of motivation.  He denies any suicidal thoughts area and he is looking forward to hopefully returning to [facility] this fall.     Visit Diagnosis:    ICD-10-CM   1. Bipolar I disorder (HCC) F31.9  lamoTRIgine (LAMICTAL) 200 MG tablet    temazepam (RESTORIL) 15 MG capsule    buPROPion (WELLBUTRIN XL) 150 MG 24 hr tablet    Past Psychiatric History: Completed induction course for ECT in June 2018  Past Medical History:  Past Medical History:  Diagnosis Date  . ADD (attention deficit disorder)   . ADHD (attention deficit hyperactivity disorder)   . Allergy   . Anxiety   . Arthritis    neck  . Asthma   . Bipolar 1 disorder (HCC)   . Depression   . Pulmonary embolism (HCC) 10/2016    Past Surgical History:  Procedure Laterality Date  . NO PAST SURGERIES      Family Psychiatric History: See intake H&P for full details. Reviewed, with no updates at this time.   Family History:  Family History  Problem Relation Age of Onset  . Cancer Father        testicular  . ADD / ADHD Father   . Depression Father   . Alcohol abuse Mother   . Depression Mother   . Anxiety disorder Sister   . Bipolar disorder Maternal Grandmother   . Dementia Maternal Grandmother   . Bipolar disorder Maternal Aunt   . Bipolar disorder Cousin   . Diabetes Neg Hx   . Heart disease Neg Hx   . Early death Neg Hx   . Stroke Neg Hx     Social History:  Social History   Social History  . Marital status: Single  Spouse name: N/A  . Number of children: N/A  . Years of education: N/A   Occupational History  . cook    Social History Main Topics  . Smoking status: Never Smoker  . Smokeless tobacco: Never Used  . Alcohol use 0.0 oz/week     Comment: none at present  . Drug use: No     Comment: none at present. periodic use of marijuana lasted used holidays  . Sexual activity: Not Currently    Partners: Female    Birth control/ protection: Condom   Other Topics Concern  . None   Social History Narrative   Exercise - assorted (walking, running, cycling, and cardio)    Allergies: No Known Allergies  Metabolic Disorder Labs: Lab Results  Component Value Date   HGBA1C 5.3  09/18/2016   MPG 105 09/18/2016   No results found for: PROLACTIN Lab Results  Component Value Date   CHOL 109 09/18/2016   TRIG 95 09/18/2016   HDL 43 09/18/2016   CHOLHDL 2.5 09/18/2016   VLDL 19 09/18/2016   LDLCALC 47 09/18/2016   LDLCALC 79 01/16/2015     Current Medications: Current Outpatient Prescriptions  Medication Sig Dispense Refill  . buPROPion (WELLBUTRIN XL) 300 MG 24 hr tablet Take 1 tablet (300 mg total) by mouth every morning. 90 tablet 1  . Cyanocobalamin (VITAMIN B 12 PO) Take 1 tablet by mouth.    . Glucosamine-Chondroit-Vit C-Mn (GLUCOSAMINE 1500 COMPLEX PO) Take 1 tablet by mouth every evening.    . lamoTRIgine (LAMICTAL) 200 MG tablet Take 1 tablet (200 mg total) by mouth daily. 90 tablet 1  . lurasidone (LATUDA) 80 MG TABS tablet Take 1 tablet (80 mg total) by mouth daily with breakfast. 90 tablet 1  . multivitamin-iron-minerals-folic acid (CENTRUM) chewable tablet Chew 1 tablet by mouth daily.    . Omega-3 Fatty Acids (FISH OIL) 1000 MG CAPS Take 1 capsule by mouth 2 (two) times daily.    . temazepam (RESTORIL) 15 MG capsule Take 1 capsule (15 mg total) by mouth at bedtime. Take 1-2 capsules at bedtime as needed 90 capsule 1  . buPROPion (WELLBUTRIN XL) 150 MG 24 hr tablet Take 1 tablet (150 mg total) by mouth daily. Take with 300 mg tablet for a total of 450 mg every morning 90 tablet 1   No current facility-administered medications for this visit.     Neurologic: Headache: Negative Seizure: Negative Paresthesias: Negative  Musculoskeletal: Strength & Muscle Tone: within normal limits Gait & Station: normal Patient leans: N/A  Psychiatric Specialty Exam: ROS  Blood pressure 118/68, pulse 77, height 6\' 3"  (1.905 m), weight 155 lb 3.2 oz (70.4 kg).Body mass index is 19.4 kg/m.  General Appearance: Casual and Fairly Groomed  Eye Contact:  Good  Speech:  Clear and Coherent  Volume:  Normal  Mood:  Euthymic and mildly dysphoric, laughing, smiling  more  Affect:  Appropriate and Congruent  Thought Process:  Coherent and Goal Directed  Orientation:  Full (Time, Place, and Person)  Thought Content: Logical   Suicidal Thoughts:  No  Homicidal Thoughts:  No  Memory:  NA Immediate;   Fair  Judgement:  Good  Insight:  Good  Psychomotor Activity:  Normal  Concentration:  Attention Span: Fair  Recall:  Fair  Fund of Knowledge: Good  Language: Good  Akathisia:  Negative  Handed:  Right  AIMS (if indicated):  0  Assets:  Communication Skills Desire for Improvement Financial Resources/Insurance Housing Leisure Time Physical  Health Resilience Social Support  ADL's:  Intact  Cognition: WNL  Sleep:  4-5 hours, napping 4-5 hours in the day also    Treatment Plan Summary: Isaiah Garza is a 26 year old male with bipolar depression. He recently completed a course of ECT in June 2018. We continue medication management as below, to address continued daytime sedation, and correct some of his difficulty with maintaining a consistent sleep cycle.  No substance abuse or alcohol use. No acute safety issues or suicidality at this time. He continues in individual therapy and will follow up with this writer in about 2 weeks.  1. Bipolar I disorder (HCC)    Increased Wellbutrin to 450 mg daily Increase Restoril to 15-30 mg nightly Continue Lamictal at the increased dose of 200 mg daily Continue Latuda 80 mg daily, and we may consider begin tapering this medication, given that it does appear to contribute to sedation, and we may be able to afford mood stability with max dose Lamictal Return to clinic in 2 weeks Continue in individual therapy  Burnard LeighAlexander Arya Roseline Ebarb, MD 03/16/2017, 9:37 AM

## 2017-03-16 NOTE — Patient Instructions (Signed)
Increase Wellbutrin to 450 mg in the morning  Increase Restoril to 30 mg at night (2 capsules)  Keep latuda at 80 mg daily for now  Continue lamictal at 200 mg daily    If you notice that wellbutrin makes your sleep or anxiety worse, stop the whole dose of the medicine  If you notice your thoughts start racing more, stop wellbutrin

## 2017-03-25 ENCOUNTER — Encounter (HOSPITAL_COMMUNITY): Payer: Self-pay | Admitting: Psychiatry

## 2017-03-29 IMAGING — CT CT ANGIO CHEST
2 of 7 series · 18 of 46 positions shown · IV contrast (ISOVUE 370)
Comparison: Chest radiographs 11/10/2016.

CLINICAL DATA: Daily chest pain and shortness of breath for 3
months. Positive D-dimer levels. Evaluate for pulmonary embolism.

EXAM:
CT ANGIOGRAPHY CHEST WITH CONTRAST
TECHNIQUE: Multidetector CT imaging of the chest was performed using the
standard protocol during bolus administration of intravenous
contrast. Multiplanar CT image reconstructions and MIPs were
obtained to evaluate the vascular anatomy.
CONTRAST:  80 ml Isovue 370.

[Series 5: thins · axial · 0.65mm/px · z∈[-359,-44]mm · 15 of 346 slices shown]
[im 16/346  lung]
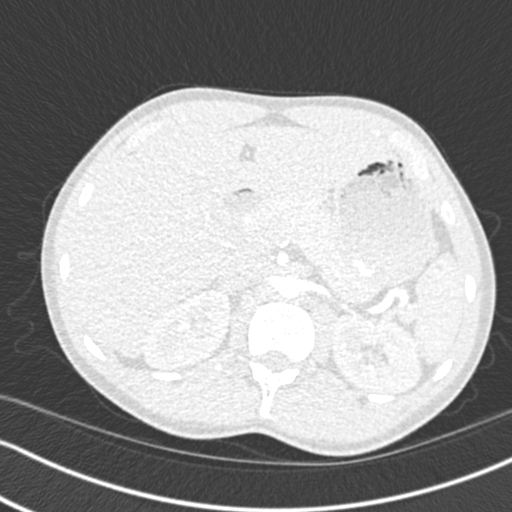
[im 46/346  soft-tissue]
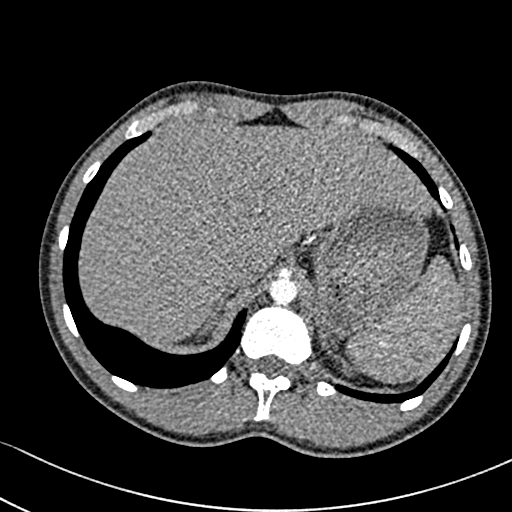
[im 61/346  lung]
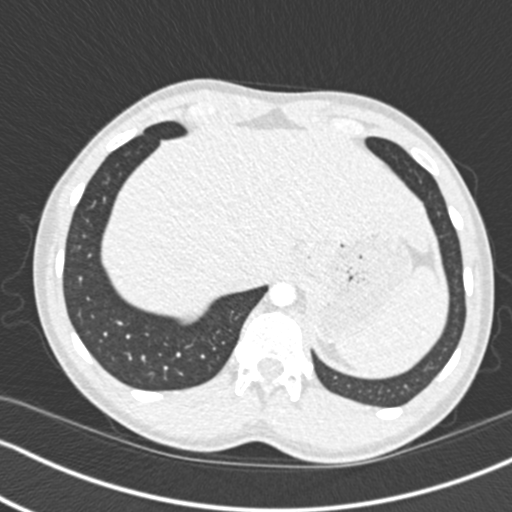
[im 91/346  soft-tissue]
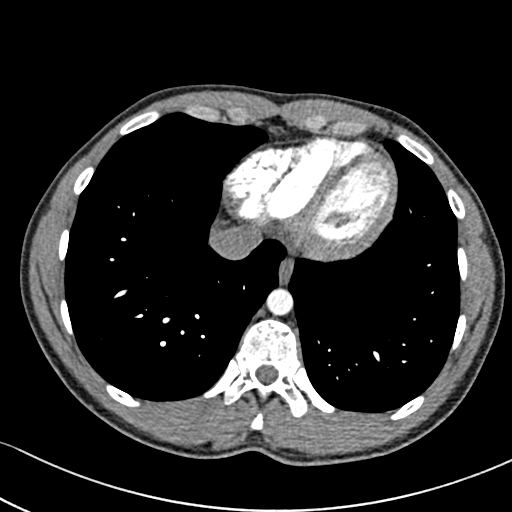
[im 106/346  lung]
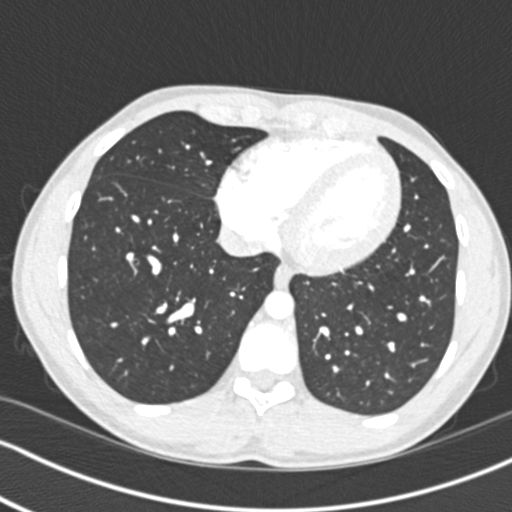
[im 136/346  soft-tissue]
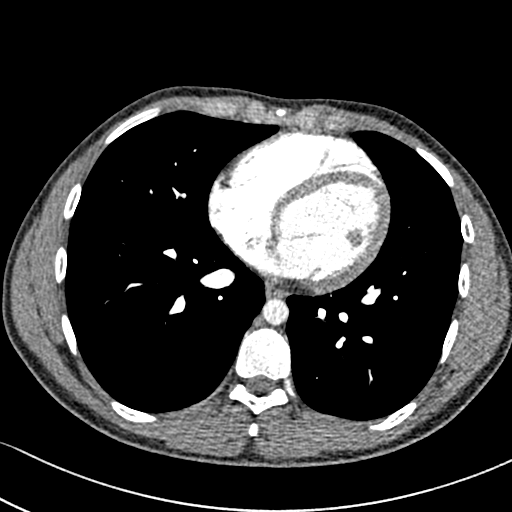
[im 151/346  lung]
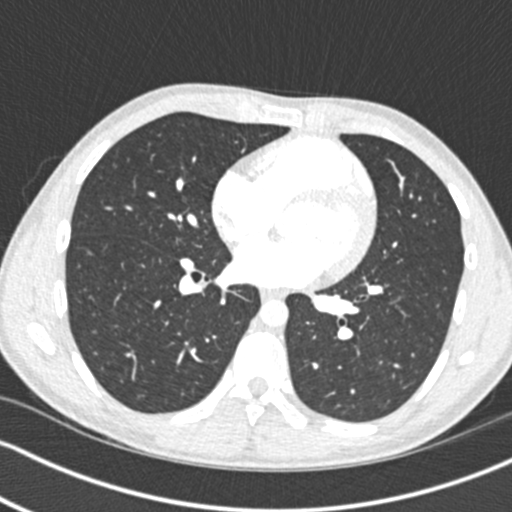
[im 181/346  soft-tissue]
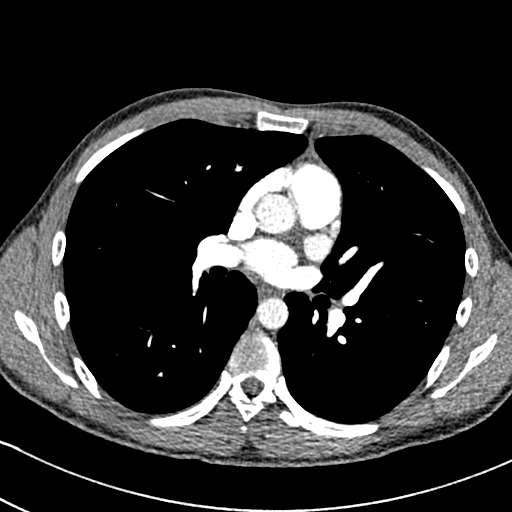
[im 196/346  lung]
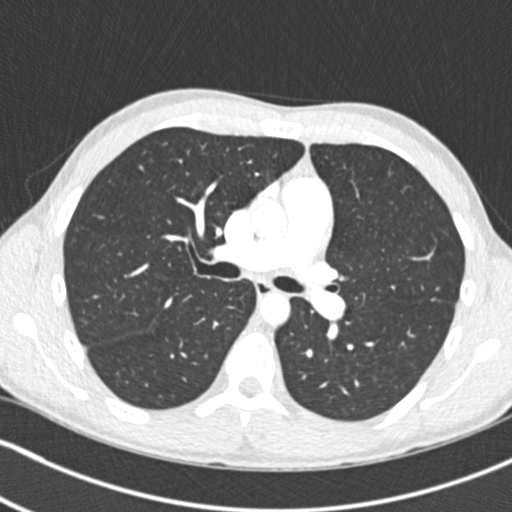
[im 211/346  soft-tissue]
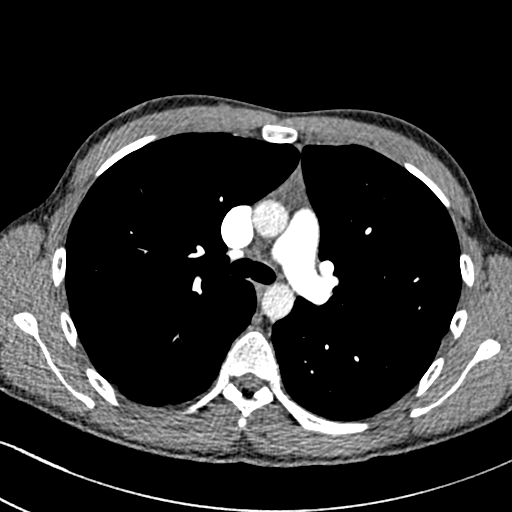
[im 241/346  lung]
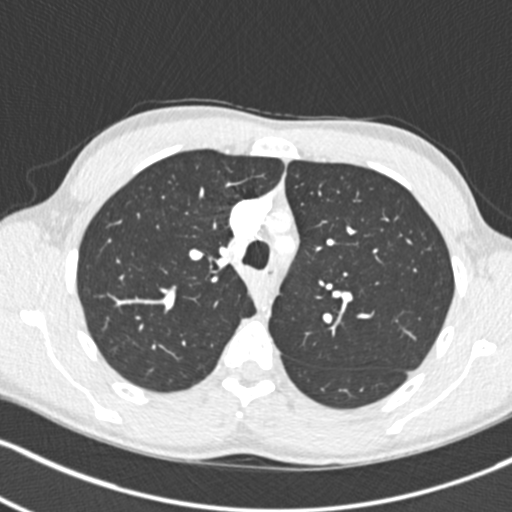
[im 256/346  soft-tissue]
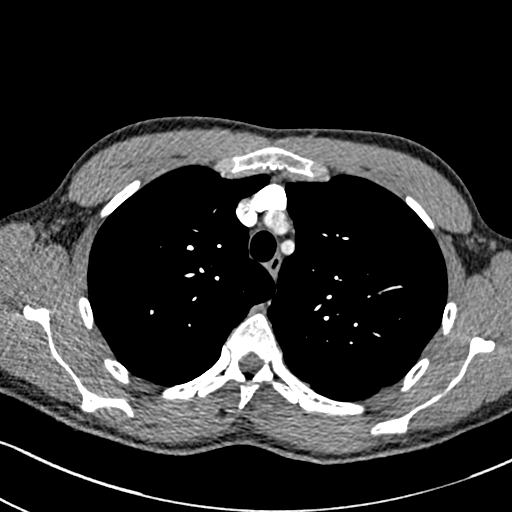
[im 286/346  lung]
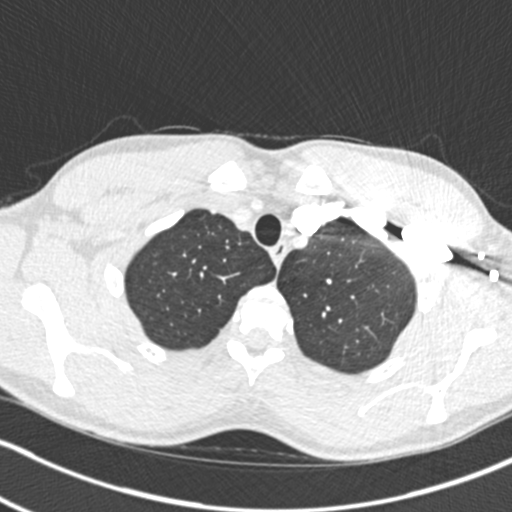
[im 301/346  soft-tissue]
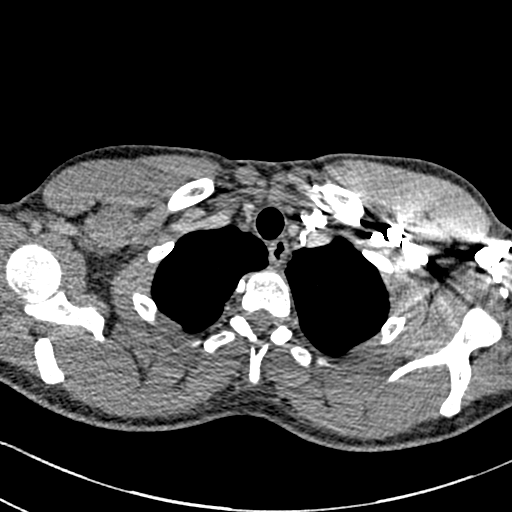
[im 331/346  lung]
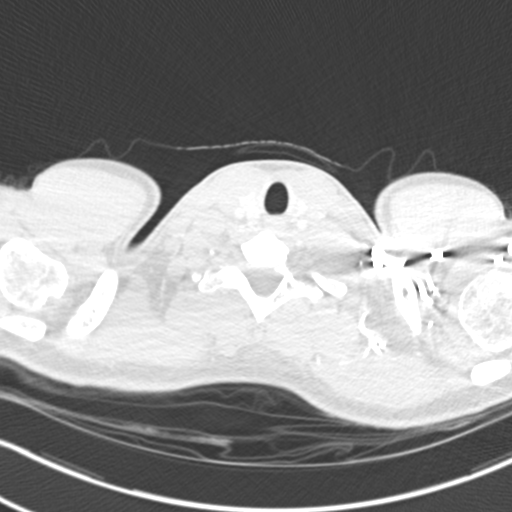

[Series 7: coronal mpr · coronal · 0.60mm/px · 3 of 118 slices shown]
[im 30/118  soft-tissue]
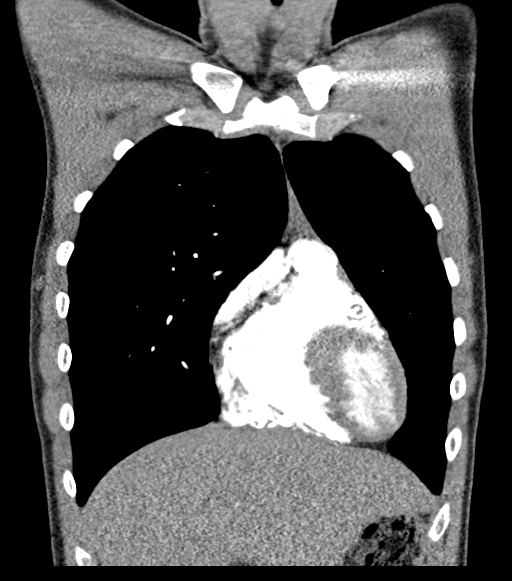
[im 59/118  soft-tissue]
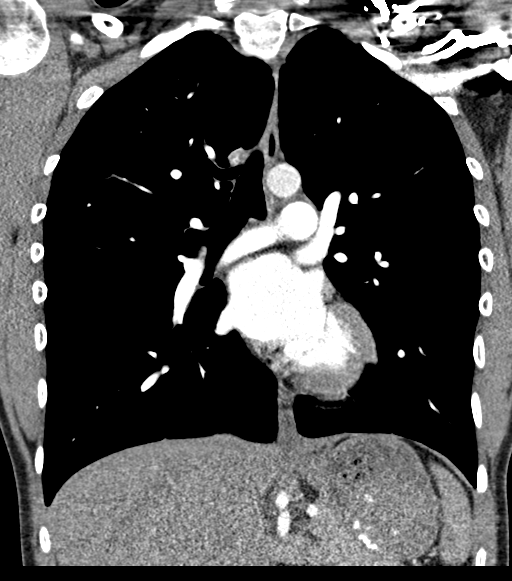
[im 88/118  soft-tissue]
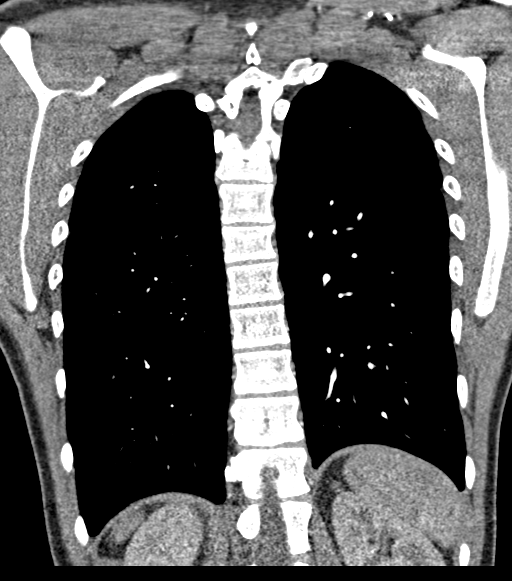

[18 of 46 positions shown; findings below may reference images not displayed]

FINDINGS: Cardiovascular: The pulmonary arteries are well opacified with
contrast to the level of the subsegmental branches. There is a small
focus of pulmonary thromboembolic disease within the right lower
lobe segmental branches (images 228 through 235 of series 5). No
central pulmonary emboli are demonstrated. The systemic vascular
system appears unremarkable. The heart size is normal. There is no
pericardial effusion.

Mediastinum/Nodes: There are no enlarged mediastinal, hilar or
axillary lymph nodes.There is a small amount of residual thymic
tissue in the anterior mediastinum. The thyroid gland, trachea and
esophagus demonstrate no significant findings.

Lungs/Pleura: There is no pleural effusion. The lungs are clear.

Upper abdomen: The visualized upper abdomen appears unremarkable
aside from ingested material in the stomach.

Musculoskeletal/Chest wall: There is no chest wall mass or
suspicious osseous finding. Mild degenerative changes in the
thoracic spine with nonacute appearing mild irregularity of the
superior endplates of T4 and T7.

Review of the MIP images confirms the above findings.
IMPRESSION: 1. Study is positive for small volume acute/subacute right lower
lobe subsegmental pulmonary embolism.
2. No evidence of right heart strain.
3. Clear lungs.
4. Critical Value/emergent results were called by telephone at the
time of interpretation on 11/12/2016 at [DATE] to Dr. TORIN
BRYANT , who verbally acknowledged these results.

## 2017-04-01 ENCOUNTER — Encounter (HOSPITAL_COMMUNITY): Payer: Self-pay | Admitting: Psychiatry

## 2017-04-01 ENCOUNTER — Ambulatory Visit (INDEPENDENT_AMBULATORY_CARE_PROVIDER_SITE_OTHER): Payer: BLUE CROSS/BLUE SHIELD | Admitting: Psychiatry

## 2017-04-01 VITALS — BP 118/68 | HR 88 | Ht 75.0 in | Wt 152.6 lb

## 2017-04-01 DIAGNOSIS — Z818 Family history of other mental and behavioral disorders: Secondary | ICD-10-CM

## 2017-04-01 DIAGNOSIS — Z811 Family history of alcohol abuse and dependence: Secondary | ICD-10-CM | POA: Diagnosis not present

## 2017-04-01 DIAGNOSIS — F900 Attention-deficit hyperactivity disorder, predominantly inattentive type: Secondary | ICD-10-CM | POA: Diagnosis not present

## 2017-04-01 MED ORDER — AMPHETAMINE-DEXTROAMPHETAMINE 20 MG PO TABS: 20.0000 mg | ORAL_TABLET | Freq: Two times a day (BID) | ORAL | 0 refills | Status: DC

## 2017-04-01 NOTE — Progress Notes (Signed)
BH MD/PA/NP OP Progress Note  04/01/2017 12:43 PM Isaiah Garza  MRN:  [id number]  Chief Complaint: "feel weird and sickly"  Subjective:  Isaiah Garza reports that Wellbutrin has been okay, but makes him feel little bit weird and sick. He reports that he tried taking one of his old doses of Adderall 20 mg and he felt normal. He wonders about trying this as an augmenting agent for his depression. We spent time discussing the risks and benefits of this, and the potential that this could elicit mania, and I cautioned him to take this gradually, but that we can try him on a dose of Adderall 20 mg twice daily.    Regarding Wellbutrin, it seems like this is contributed to some GI upset and feeling physically ill in the latter part of the day so we agreed to decrease back to 300 mg. He will gradually tried to decrease Wellbutrin further to 150 and see if he is okay without it altogether, as he feels that this has not been particularly helpful for him.  He continues on Lamictal and Latuda at the current doses and he reports that he is sleeping pretty well at night. He did get out earlier in the week to go biking, and this was fun and uplifting for him. He is thinking about applying for disability because he's had such difficulty with his depression over the past few months. He denies any acute suicidality or safety issues and agrees to follow-up in 4 weeks.  Visit Diagnosis:    ICD-10-CM   1. Attention deficit hyperactivity disorder (ADHD), predominantly inattentive type F90.0 amphetamine-dextroamphetamine (ADDERALL) 20 MG tablet    Past Psychiatric History: See intake H&P for full details. Reviewed, with no updates at this time.  Past Medical History:  Past Medical History:  Diagnosis Date  . ADD (attention deficit disorder)   . ADHD (attention deficit hyperactivity disorder)   . Allergy   . Anxiety   . Arthritis    neck  . Asthma   . Bipolar 1 disorder (HCC)   . Depression   . Pulmonary  embolism (HCC) 10/2016    Past Surgical History:  Procedure Laterality Date  . NO PAST SURGERIES      Family Psychiatric History: See intake H&P for full details. Reviewed, with no updates at this time.   Family History:  Family History  Problem Relation Age of Onset  . Cancer Father        testicular  . ADD / ADHD Father   . Depression Father   . Alcohol abuse Mother   . Depression Mother   . Anxiety disorder Sister   . Bipolar disorder Maternal Grandmother   . Dementia Maternal Grandmother   . Bipolar disorder Maternal Aunt   . Bipolar disorder Cousin   . Diabetes Neg Hx   . Heart disease Neg Hx   . Early death Neg Hx   . Stroke Neg Hx     Social History:  Social History   Social History  . Marital status: Single    Spouse name: N/A  . Number of children: N/A  . Years of education: N/A   Occupational History  . cook    Social History Main Topics  . Smoking status: Never Smoker  . Smokeless tobacco: Never Used  . Alcohol use 0.0 oz/week     Comment: none at present  . Drug use: No     Comment: none at present. periodic use of marijuana lasted used  holidays  . Sexual activity: Not Currently    Partners: Female    Birth control/ protection: Condom   Other Topics Concern  . None   Social History Narrative   Exercise - assorted (walking, running, cycling, and cardio)    Allergies: No Known Allergies  Metabolic Disorder Labs: Lab Results  Component Value Date   HGBA1C 5.3 09/18/2016   MPG 105 09/18/2016   No results found for: PROLACTIN Lab Results  Component Value Date   CHOL 109 09/18/2016   TRIG 95 09/18/2016   HDL 43 09/18/2016   CHOLHDL 2.5 09/18/2016   VLDL 19 09/18/2016   LDLCALC 47 09/18/2016   LDLCALC 79 01/16/2015     Current Medications: Current Outpatient Prescriptions  Medication Sig Dispense Refill  . buPROPion (WELLBUTRIN XL) 150 MG 24 hr tablet Take 1 tablet (150 mg total) by mouth daily. Take with 300 mg tablet for a  total of 450 mg every morning 90 tablet 1  . buPROPion (WELLBUTRIN XL) 300 MG 24 hr tablet Take 1 tablet (300 mg total) by mouth every morning. 90 tablet 1  . Cyanocobalamin (VITAMIN B 12 PO) Take 1 tablet by mouth.    . Glucosamine-Chondroit-Vit C-Mn (GLUCOSAMINE 1500 COMPLEX PO) Take 1 tablet by mouth every evening.    . lamoTRIgine (LAMICTAL) 200 MG tablet Take 1 tablet (200 mg total) by mouth daily. 90 tablet 1  . lurasidone (LATUDA) 80 MG TABS tablet Take 1 tablet (80 mg total) by mouth daily with breakfast. 90 tablet 1  . multivitamin-iron-minerals-folic acid (CENTRUM) chewable tablet Chew 1 tablet by mouth daily.    . Omega-3 Fatty Acids (FISH OIL) 1000 MG CAPS Take 1 capsule by mouth 2 (two) times daily.    . temazepam (RESTORIL) 15 MG capsule Take 1 capsule (15 mg total) by mouth at bedtime. Take 1-2 capsules at bedtime as needed 90 capsule 1  . amphetamine-dextroamphetamine (ADDERALL) 20 MG tablet Take 1 tablet (20 mg total) by mouth 2 (two) times daily. 60 tablet 0   No current facility-administered medications for this visit.     Neurologic: Headache: Negative Seizure: Negative Paresthesias: Negative  Musculoskeletal: Strength & Muscle Tone: within normal limits Gait & Station: normal Patient leans: N/A  Psychiatric Specialty Exam: ROS  Blood pressure 118/68, pulse 88, height 6\' 3"  (1.905 m), weight 152 lb 9.6 oz (69.2 kg).Body mass index is 19.07 kg/m.  General Appearance: Casual and Fairly Groomed  Eye Contact:  Good  Speech:  Clear and Coherent  Volume:  Normal  Mood:  Euthymic  Affect:  Appropriate and Congruent  Thought Process:  Goal Directed  Orientation:  Full (Time, Place, and Person)  Thought Content: Logical   Suicidal Thoughts:  No  Homicidal Thoughts:  No  Memory:  Immediate;   Good  Judgement:  Good  Insight:  Good  Psychomotor Activity:  Normal  Concentration:  Concentration: Good  Recall:  Good  Fund of Knowledge: Good  Language: Good   Akathisia:  Negative  Handed:  Right  AIMS (if indicated):  0  Assets:  Communication Skills Desire for Improvement Housing Leisure Time Physical Health Social Support Transportation  ADL's:  Intact  Cognition: WNL  Sleep:  8-10 hours     Treatment Plan Summary: Isaiah Garza is a 26 year old male with a history of bipolar 1 disorder and ADHD from childhood who presents today for medication management follow-up. He is on an adequate dose of mood stabilizer with Lamictal 200 mg and Latuda 80  mg, and sleeps consistently every night with Restoril 15-30 mg.  A major issue for him has been ongoing psychomotor depression and low energy, poor concentration. He has had an equivocal response to Wellbutrin, so we have agreed to initiate Adderall which he had previously been managed on as a teenager and in his early 54s.  No acute safety issues and he'll follow-up in clinic in 4 weeks.  1. Attention deficit hyperactivity disorder (ADHD), predominantly inattentive type    Continue Latuda 80 mg daily and Lamictal 200 mg daily Decrease Wellbutrin to 300 mg Continue Restoril 15-30 mg nightly for sleep Initiate Adderall 20 mg twice daily Return to clinic in 4 weeks  Burnard Leigh, MD 04/01/2017, 12:43 PM

## 2017-04-27 ENCOUNTER — Ambulatory Visit (INDEPENDENT_AMBULATORY_CARE_PROVIDER_SITE_OTHER): Payer: BLUE CROSS/BLUE SHIELD | Admitting: Psychiatry

## 2017-04-27 ENCOUNTER — Encounter (HOSPITAL_COMMUNITY): Payer: Self-pay | Admitting: Psychiatry

## 2017-04-27 VITALS — BP 122/70 | HR 71 | Ht 75.0 in | Wt 154.8 lb

## 2017-04-27 DIAGNOSIS — F319 Bipolar disorder, unspecified: Secondary | ICD-10-CM | POA: Diagnosis not present

## 2017-04-27 DIAGNOSIS — Z79899 Other long term (current) drug therapy: Secondary | ICD-10-CM | POA: Diagnosis not present

## 2017-04-27 DIAGNOSIS — Z811 Family history of alcohol abuse and dependence: Secondary | ICD-10-CM

## 2017-04-27 DIAGNOSIS — F900 Attention-deficit hyperactivity disorder, predominantly inattentive type: Secondary | ICD-10-CM | POA: Diagnosis not present

## 2017-04-27 DIAGNOSIS — Z810 Family history of intellectual disabilities: Secondary | ICD-10-CM | POA: Diagnosis not present

## 2017-04-27 DIAGNOSIS — Z818 Family history of other mental and behavioral disorders: Secondary | ICD-10-CM

## 2017-04-27 MED ORDER — LISDEXAMFETAMINE DIMESYLATE 50 MG PO CAPS: 50.0000 mg | ORAL_CAPSULE | Freq: Every day | ORAL | 0 refills | Status: DC

## 2017-04-27 MED ORDER — MIRTAZAPINE 15 MG PO TABS: 15.0000 mg | ORAL_TABLET | Freq: Every day | ORAL | 2 refills | Status: DC

## 2017-04-27 NOTE — Progress Notes (Signed)
BH MD/PA/NP OP Progress Note  04/27/2017 1:22 PM Isaiah Garza  MRN:  [id number]  Chief Complaint:  better  Subjective:  Isaiah Garza presents today for med management follow-up. Reports that his motivation and energy have been better with the Adderall, and he definitely feels a bit less depressed. He is now fully off of Wellbutrin. He does not feel that the Wellbutrin was doing anything now that he is off of it. He reports that he continues with some depressive symptoms, and is hopeful that the extended release stimulant may help. We agreed to switch him to Vyvanse given that he has been on that in the past with good tolerability.  He has not had any manic or psychotic symptoms. He is going to be starting school in a few weeks at [employer].  He was able to visit some friends in [place] a couple weeks ago, and reports that he would typically be able to enjoy seeing a new city, but he felt fairly anhedonic, and it was difficult for him to get into seeing the sights. This is unusual for him.  Regarding mood, he is interested in an antidepressant to help with his ongoing depression and anhedonia. We discussed switching from Adderall to Vyvanse 50 mg, and adding Remeron nightly to help with sleep and depression .he denies any suicidality and agrees to follow-up in 4 weeks. Reviewed the risks and benefits of the medication adjustments. We also discussed increasing Latuda to 120 mg daily, but agreed to make the above changes first.  He is interested in TMS for his depression symptoms, and wonders about whether or not this will be covered by insurance. We have reviewed the risks and benefits of TMS is well, and I have provided him some information packet.he does not have any medical contraindications to TMS.  Visit Diagnosis:    ICD-10-CM   1. Attention deficit hyperactivity disorder (ADHD), predominantly inattentive type F90.0 lisdexamfetamine (VYVANSE) 50 MG capsule    mirtazapine (REMERON) 15 MG tablet     DISCONTINUED: mirtazapine (REMERON) 15 MG tablet  2. Bipolar I disorder (HCC) F31.9 lisdexamfetamine (VYVANSE) 50 MG capsule    mirtazapine (REMERON) 15 MG tablet    DISCONTINUED: mirtazapine (REMERON) 15 MG tablet    Past Psychiatric History: See intake H&P for full details. Reviewed, with no updates at this time.  Past Medical History:  Past Medical History:  Diagnosis Date  . ADD (attention deficit disorder)   . ADHD (attention deficit hyperactivity disorder)   . Allergy   . Anxiety   . Arthritis    neck  . Asthma   . Bipolar 1 disorder (HCC)   . Depression   . Pulmonary embolism (HCC) 10/2016    Past Surgical History:  Procedure Laterality Date  . NO PAST SURGERIES      Family Psychiatric History: See intake H&P for full details. Reviewed, with no updates at this time.   Family History:  Family History  Problem Relation Age of Onset  . Cancer Father        testicular  . ADD / ADHD Father   . Depression Father   . Alcohol abuse Mother   . Depression Mother   . Anxiety disorder Sister   . Bipolar disorder Maternal Grandmother   . Dementia Maternal Grandmother   . Bipolar disorder Maternal Aunt   . Bipolar disorder Cousin   . Diabetes Neg Hx   . Heart disease Neg Hx   . Early death Neg Hx   .  Stroke Neg Hx     Social History:  Social History   Social History  . Marital status: Single    Spouse name: N/A  . Number of children: N/A  . Years of education: N/A   Occupational History  . cook    Social History Main Topics  . Smoking status: Never Smoker  . Smokeless tobacco: Never Used  . Alcohol use 0.0 oz/week     Comment: none at present  . Drug use: No     Comment: none at present. periodic use of marijuana lasted used holidays  . Sexual activity: Not Currently    Partners: Female    Birth control/ protection: Condom   Other Topics Concern  . None   Social History Narrative   Exercise - assorted (walking, running, cycling, and cardio)     Allergies: No Known Allergies  Metabolic Disorder Labs: Lab Results  Component Value Date   HGBA1C 5.3 09/18/2016   MPG 105 09/18/2016   No results found for: PROLACTIN Lab Results  Component Value Date   CHOL 109 09/18/2016   TRIG 95 09/18/2016   HDL 43 09/18/2016   CHOLHDL 2.5 09/18/2016   VLDL 19 09/18/2016   LDLCALC 47 09/18/2016   LDLCALC 79 01/16/2015     Current Medications: Current Outpatient Prescriptions  Medication Sig Dispense Refill  . Cyanocobalamin (VITAMIN B 12 PO) Take 1 tablet by mouth.    . Glucosamine-Chondroit-Vit C-Mn (GLUCOSAMINE 1500 COMPLEX PO) Take 1 tablet by mouth every evening.    . lamoTRIgine (LAMICTAL) 200 MG tablet Take 1 tablet (200 mg total) by mouth daily. 90 tablet 1  . lisdexamfetamine (VYVANSE) 50 MG capsule Take 1 capsule (50 mg total) by mouth daily. 30 capsule 0  . lurasidone (LATUDA) 80 MG TABS tablet Take 1 tablet (80 mg total) by mouth daily with breakfast. 90 tablet 1  . mirtazapine (REMERON) 15 MG tablet Take 1 tablet (15 mg total) by mouth at bedtime. 30 tablet 2  . multivitamin-iron-minerals-folic acid (CENTRUM) chewable tablet Chew 1 tablet by mouth daily.    . Omega-3 Fatty Acids (FISH OIL) 1000 MG CAPS Take 1 capsule by mouth 2 (two) times daily.    . temazepam (RESTORIL) 15 MG capsule Take 1 capsule (15 mg total) by mouth at bedtime. Take 1-2 capsules at bedtime as needed 90 capsule 1   No current facility-administered medications for this visit.     Neurologic: Headache: Negative Seizure: Negative Paresthesias: Negative  Musculoskeletal: Strength & Muscle Tone: within normal limits Gait & Station: normal Patient leans: N/A  Psychiatric Specialty Exam: ROS  Blood pressure 122/70, pulse 71, height 6\' 3"  (1.905 m), weight 154 lb 12.8 oz (70.2 kg).Body mass index is 19.35 kg/m.  General Appearance: Casual and Fairly Groomed, blue nails  Eye Contact:  Good  Speech:  Clear and Coherent  Volume:  Normal  Mood:   Euthymic, better energy  Affect:  Appropriate and Congruent  Thought Process:  Goal Directed  Orientation:  Full (Time, Place, and Person)  Thought Content: Logical   Suicidal Thoughts:  No  Homicidal Thoughts:  No  Memory:  Immediate;   Good  Judgement:  Good  Insight:  Good  Psychomotor Activity:  Normal  Concentration:  Concentration: Good  Recall:  Good  Fund of Knowledge: Good  Language: Good  Akathisia:  Negative  Handed:  Right  AIMS (if indicated):  0  Assets:  Communication Skills Desire for Improvement Housing Leisure Time Physical Health Social Support  Transportation  ADL's:  Intact  Cognition: WNL  Sleep:  8-10 hours    Treatment Plan Summary: Isaiah Garza is a 26 year old male with a history of bipolar 1 disorder and ADHD from childhood who presents today for medication management follow-up. He is on an adequate dose of mood stabilizer with Lamictal 200 mg and Latuda 80 mg, and sleeps consistently every night with Restoril 15-30 mg. He continues with depressive symptoms and attention and cognition symptoms. We have been titrating stimulant effect, and will initiate Remeron as below to see if we can help augment some of his depressive symptoms, and begin to taper restoril.  1. Attention deficit hyperactivity disorder (ADHD), predominantly inattentive type   2. Bipolar I disorder (HCC)    Continue Latuda 80 mg daily and Lamictal 200 mg daily Wellbutrin discontinued after taper Discontinue Adderall 20 mg twice a day Initiate Vyvanse 50 mg daily Continue Restoril 15 mg nightly; okay to decrease to 7.5 mg nightly  initiate Remeron 15 mg nightly Return to clinic in 4 weeks Consider TMS  Burnard LeighAlexander Arya Eksir, MD 04/27/2017, 1:22 PM

## 2017-05-06 ENCOUNTER — Encounter (HOSPITAL_COMMUNITY): Payer: Self-pay | Admitting: Psychiatry

## 2017-05-16 ENCOUNTER — Encounter (HOSPITAL_COMMUNITY): Payer: Self-pay | Admitting: Psychiatry

## 2017-05-17 ENCOUNTER — Encounter (HOSPITAL_COMMUNITY): Payer: Self-pay | Admitting: Psychiatry

## 2017-05-18 ENCOUNTER — Encounter (HOSPITAL_COMMUNITY): Payer: Self-pay | Admitting: Psychiatry

## 2017-05-19 ENCOUNTER — Other Ambulatory Visit (HOSPITAL_COMMUNITY): Payer: Self-pay | Admitting: Psychiatry

## 2017-05-19 DIAGNOSIS — F900 Attention-deficit hyperactivity disorder, predominantly inattentive type: Secondary | ICD-10-CM

## 2017-05-19 DIAGNOSIS — F319 Bipolar disorder, unspecified: Secondary | ICD-10-CM

## 2017-05-19 MED ORDER — MIRTAZAPINE 30 MG PO TABS: 30.0000 mg | ORAL_TABLET | Freq: Every day | ORAL | 1 refills | Status: DC

## 2017-06-03 ENCOUNTER — Encounter (HOSPITAL_COMMUNITY): Payer: Self-pay | Admitting: Psychiatry

## 2017-06-03 ENCOUNTER — Ambulatory Visit (INDEPENDENT_AMBULATORY_CARE_PROVIDER_SITE_OTHER): Payer: BLUE CROSS/BLUE SHIELD | Admitting: Psychiatry

## 2017-06-03 DIAGNOSIS — Z810 Family history of intellectual disabilities: Secondary | ICD-10-CM | POA: Diagnosis not present

## 2017-06-03 DIAGNOSIS — F900 Attention-deficit hyperactivity disorder, predominantly inattentive type: Secondary | ICD-10-CM | POA: Diagnosis not present

## 2017-06-03 DIAGNOSIS — Z811 Family history of alcohol abuse and dependence: Secondary | ICD-10-CM

## 2017-06-03 DIAGNOSIS — F319 Bipolar disorder, unspecified: Secondary | ICD-10-CM | POA: Diagnosis not present

## 2017-06-03 DIAGNOSIS — Z818 Family history of other mental and behavioral disorders: Secondary | ICD-10-CM | POA: Diagnosis not present

## 2017-06-03 MED ORDER — AMPHETAMINE-DEXTROAMPHET ER 30 MG PO CP24: 60.0000 mg | ORAL_CAPSULE | Freq: Every day | ORAL | 0 refills | Status: DC

## 2017-06-03 MED ORDER — MIRTAZAPINE 45 MG PO TABS: 45.0000 mg | ORAL_TABLET | Freq: Every day | ORAL | 1 refills | Status: DC

## 2017-06-03 MED ORDER — TEMAZEPAM 7.5 MG PO CAPS: 7.5000 mg | ORAL_CAPSULE | Freq: Every day | ORAL | 1 refills | Status: DC

## 2017-06-03 NOTE — Progress Notes (Signed)
BH MD/PA/NP OP Progress Note  06/03/2017 10:05 AM Isaiah Garza  MRN:  [id number]  Chief Complaint:  Chief Complaint    Follow-up     HPI: Isaiah Garza reports that the Vyvanse 50 mg feels fairly ineffective, he feels like he is not taking anything, and then he feels very irritable and tired towards and at the day.  he reports that he is not depressed or in the [place] in terms of his mood, but he does not have any drive or enjoyment. We discussed increasing Remeron to 45 mg nightly for sleep and depression, and switching from Vyvanse to Adderall extended release. He denies any suicidal thoughts. He denies any CBD oil or cannabis use. He has taken a break from school and wants to return in the spring. He continues to read a lot about alternative treatments including ketamine. I educated him on the minimal data associated with ketamine at this time, and that also he was declined for TMS given the diagnosis of bipolar disorder. We spent time discussing medication changes, including potentially switching antipsychotics, and revisiting ECT if needed in the future. We will follow-up in 6-8 weeks.  With regard to sleep, he reports that he is taking Restoril 15 mg nightly and Remeron. He feels like he sleeps too much, and ended up sleeping 10-12 hours nightly on a regular basis. We discussed decreasing Restoril to 7.5 mg nightly and eventually discontinuing. He is agreeable to this. He reports that he is also working with vocational rehabilitation to work on finding job placement. He reports that they have done testing with him and have concern about his ongoing memory difficulties with regard to ADHD and mood.  Visit Diagnosis:    ICD-10-CM   1. Bipolar I disorder (HCC) F31.9 temazepam (RESTORIL) 7.5 MG capsule  2. Attention deficit hyperactivity disorder (ADHD), predominantly inattentive type F90.0 mirtazapine (REMERON) 45 MG tablet    Past Psychiatric History: See intake H&P for full details.  Reviewed, with no updates at this time.   Past Medical History:  Past Medical History:  Diagnosis Date  . ADD (attention deficit disorder)   . ADHD (attention deficit hyperactivity disorder)   . Allergy   . Anxiety   . Arthritis    neck  . Asthma   . Bipolar 1 disorder (HCC)   . Depression   . Pulmonary embolism (HCC) 10/2016    Past Surgical History:  Procedure Laterality Date  . NO PAST SURGERIES      Family Psychiatric History: See intake H&P for full details. Reviewed, with no updates at this time.   Family History:  Family History  Problem Relation Age of Onset  . Cancer Father        testicular  . ADD / ADHD Father   . Depression Father   . Alcohol abuse Mother   . Depression Mother   . Anxiety disorder Sister   . Bipolar disorder Maternal Grandmother   . Dementia Maternal Grandmother   . Bipolar disorder Maternal Aunt   . Bipolar disorder Cousin   . Diabetes Neg Hx   . Heart disease Neg Hx   . Early death Neg Hx   . Stroke Neg Hx     Social History:  Social History   Social History  . Marital status: Single    Spouse name: N/A  . Number of children: N/A  . Years of education: N/A   Occupational History  . cook    Social History Main Topics  .  Smoking status: Never Smoker  . Smokeless tobacco: Never Used  . Alcohol use 0.0 oz/week     Comment: none at present  . Drug use: No     Comment: none at present. periodic use of marijuana lasted used holidays  . Sexual activity: Not Currently    Partners: Female    Birth control/ protection: Condom   Other Topics Concern  . None   Social History Narrative   Exercise - assorted (walking, running, cycling, and cardio)    Allergies: No Known Allergies  Metabolic Disorder Labs: Lab Results  Component Value Date   HGBA1C 5.3 09/18/2016   MPG 105 09/18/2016   No results found for: PROLACTIN Lab Results  Component Value Date   CHOL 109 09/18/2016   TRIG 95 09/18/2016   HDL 43 09/18/2016    CHOLHDL 2.5 09/18/2016   VLDL 19 09/18/2016   LDLCALC 47 09/18/2016   LDLCALC 79 01/16/2015   Lab Results  Component Value Date   TSH 1.448 09/18/2016    Therapeutic Level Labs: Lab Results  Component Value Date   LITHIUM 1.20 09/18/2016   No results found for: VALPROATE No components found for:  CBMZ  Current Medications: Current Outpatient Prescriptions  Medication Sig Dispense Refill  . Cyanocobalamin (VITAMIN B 12 PO) Take 1 tablet by mouth.    . Glucosamine-Chondroit-Vit C-Mn (GLUCOSAMINE 1500 COMPLEX PO) Take 1 tablet by mouth every evening.    . lamoTRIgine (LAMICTAL) 200 MG tablet Take 1 tablet (200 mg total) by mouth daily. 90 tablet 1  . lurasidone (LATUDA) 80 MG TABS tablet Take 1 tablet (80 mg total) by mouth daily with breakfast. 90 tablet 1  . multivitamin-iron-minerals-folic acid (CENTRUM) chewable tablet Chew 1 tablet by mouth daily.    . Omega-3 Fatty Acids (FISH OIL) 1000 MG CAPS Take 1 capsule by mouth 2 (two) times daily.    . temazepam (RESTORIL) 7.5 MG capsule Take 1 capsule (7.5 mg total) by mouth at bedtime. Take 1-2 capsules at bedtime as needed 90 capsule 1  . amphetamine-dextroamphetamine (ADDERALL XR) 30 MG 24 hr capsule Take 2 capsules (60 mg total) by mouth daily. 60 capsule 0  . [START ON 07/03/2017] amphetamine-dextroamphetamine (ADDERALL XR) 30 MG 24 hr capsule Take 2 capsules (60 mg total) by mouth daily. 60 capsule 0  . mirtazapine (REMERON) 45 MG tablet Take 1 tablet (45 mg total) by mouth at bedtime. 90 tablet 1   No current facility-administered medications for this visit.      Musculoskeletal: Strength & Muscle Tone: within normal limits Gait & Station: normal Patient leans: N/A  Psychiatric Specialty Exam: ROS  Blood pressure 138/78, pulse 84, height  (1.905 m), weight 160 lb 3.2 oz (72.7 kg).Body mass index is 20.02 kg/m.  General Appearance: Casual and Fairly Groomed  Eye Contact:  Fair  Speech:  Clear and Coherent   Volume:  Normal  Mood:  Euthymic  Affect:  Blunt and Congruent  Thought Process:  Goal Directed  Orientation:  Full (Time, Place, and Person)  Thought Content: Logical   Suicidal Thoughts:  No  Homicidal Thoughts:  No  Memory:  Immediate;   Good  Judgement:  Good  Insight:  Fair  Psychomotor Activity:  Normal  Concentration:  Concentration: Good  Recall:  Good  Fund of Knowledge: Good  Language: Good  Akathisia:  Negative  Handed:  Right  AIMS (if indicated): not done  Assets:  Communication Skills Desire for Improvement Financial Resources/Insurance Housing Leisure Time  Social Support Transportation  ADL's:  Intact  Cognition: WNL  Sleep:  Good   Screenings: AUDIT     Admission (Discharged) from 09/13/2016 in [facility]  Alcohol Use Disorder Identification Test Final Score (AUDIT)  2    ECT-MADRS     ECT Treatment from 02/28/2017 in [facility] ECT Treatment from 02/21/2017 in [facility]  MADRS Total Score  30  28    Mini-Mental     ECT Treatment from 02/28/2017 in [facility] ECT Treatment from 02/21/2017 in [facility]  Total Score (max 30 points )  30  28    PHQ2-9     [facility]/2017 [facility] Office Visit from 12[facility]30/2016 [facility] Office Visit from 01/16/2015 in [facility] at [facility] Visit from 05/02/2013 in [facility]  PHQ-2 Total Score  6  5  0  0  PHQ-9 Total Score  21  19  -  -       Assessment and Plan: Isaiah Garza is a 26 year old male with bipolar 1, and ADHD since childhood who presents today for medication management follow-up. He had a minimal response to ECT this summer.  He continues to participate in individual therapy and recently started vocational  rehabilitation. He continues to struggle with anhedonia, and we will proceed with some medication modifications as below and follow up in 6-8 weeks.   1. Bipolar I disorder (HCC)   2. Attention deficit hyperactivity disorder (ADHD), predominantly inattentive type    Continue Lamictal 200 mg daily Continue Latuda 80 mg daily; consider increase to 120 mg versus taper and switch to different antipsychotic Discontinue Vyvanse Initiate Adderall XR 60 mg daily, or 30 mg XR twice daily (am and noon) Decrease Restoril to 7.5 mg 1 month, then consider discontinuing Increase Remeron to 45 mg nightly for depression and sleep Return to clinic in 6 weeks Burnard Leigh, MD 06/03/2017, 10:05 AM

## 2017-06-14 ENCOUNTER — Encounter (HOSPITAL_COMMUNITY): Payer: Self-pay | Admitting: Psychiatry

## 2017-06-17 ENCOUNTER — Encounter (HOSPITAL_COMMUNITY): Payer: Self-pay | Admitting: Psychiatry

## 2017-06-24 IMAGING — CR DG CHEST 2V
1 series · 2 of 2 positions shown · non-contrast
Comparison: 11/10/2016

CLINICAL DATA: Preprocedural assessment, LEFT side chest pain for 6
months, intermittent shortness of breath for 2 months, history
pulmonary embolism

EXAM:
CHEST  2 VIEW

[Series 1: dg chest 2 view · 0.14mm/px · 2 of 2 slices shown]
[im 1/2]
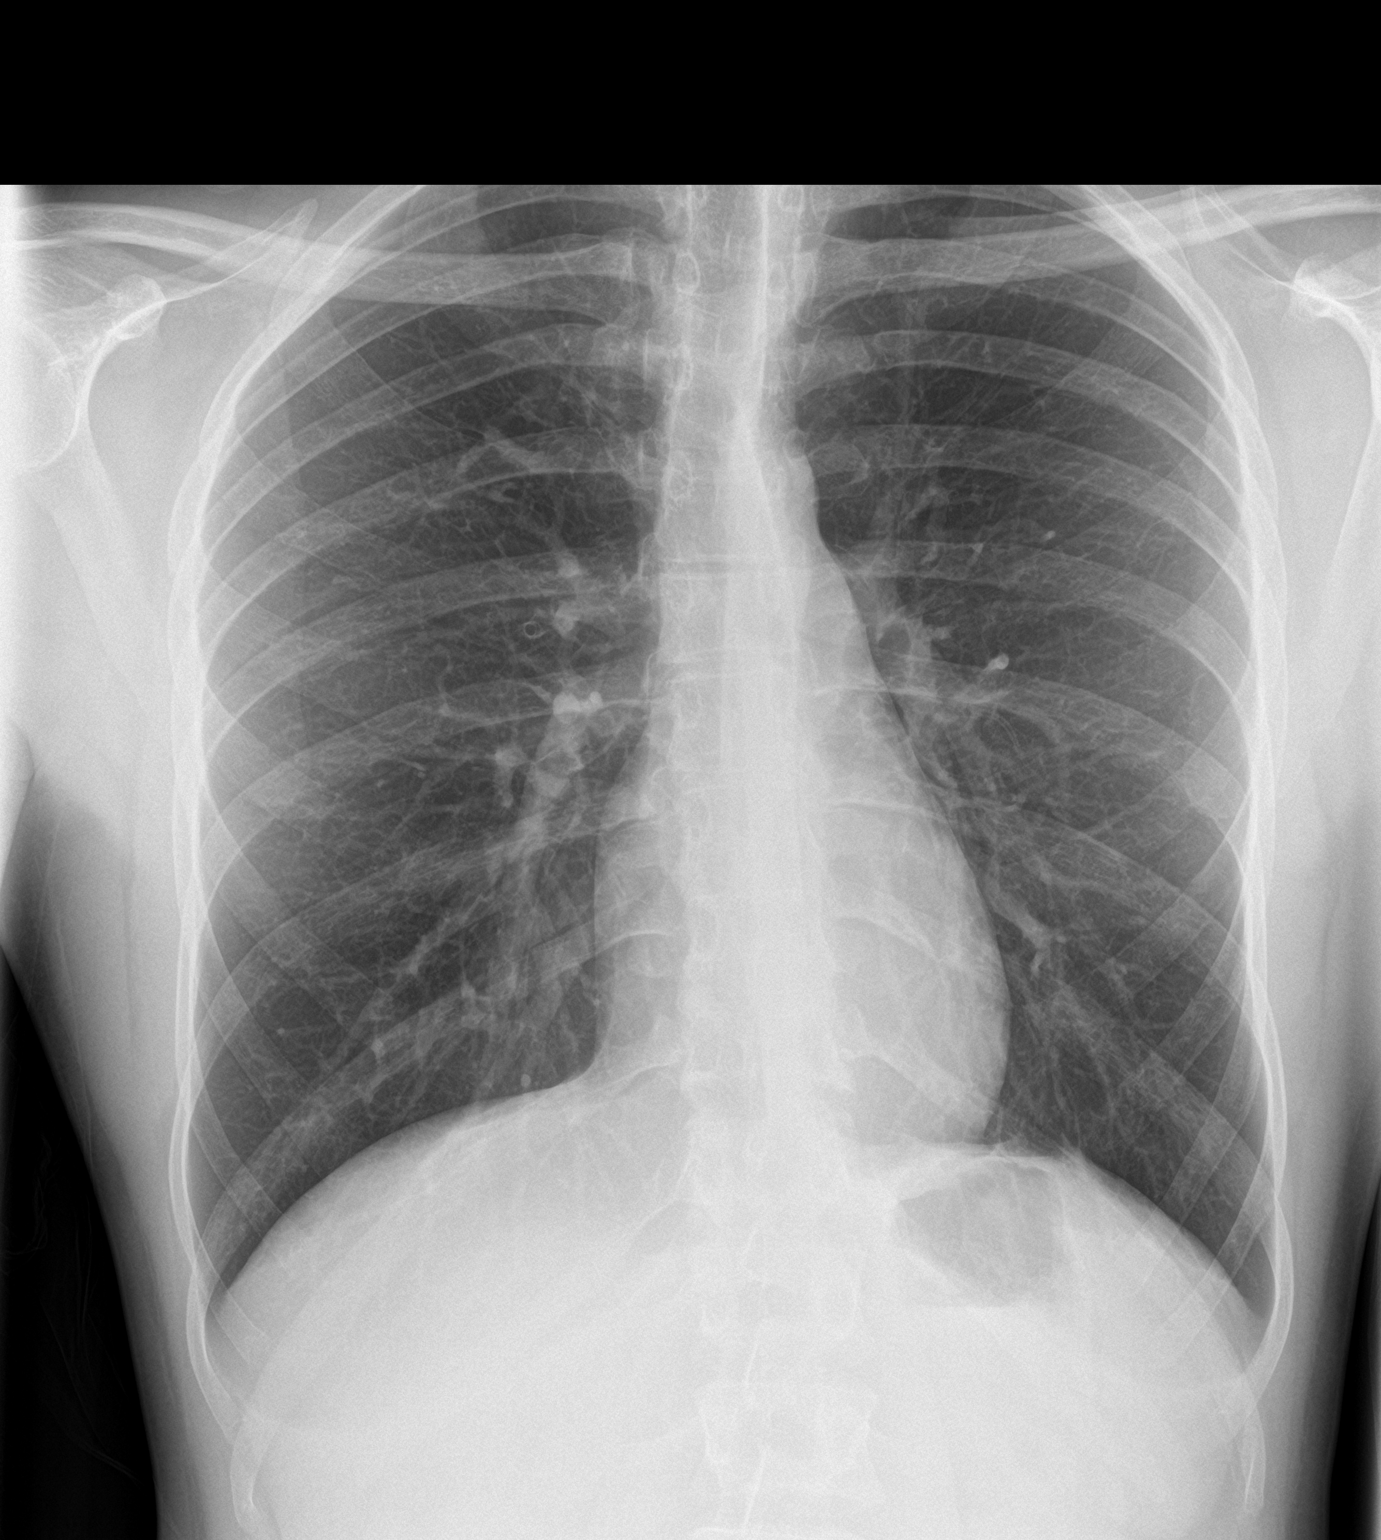
[im 2/2]
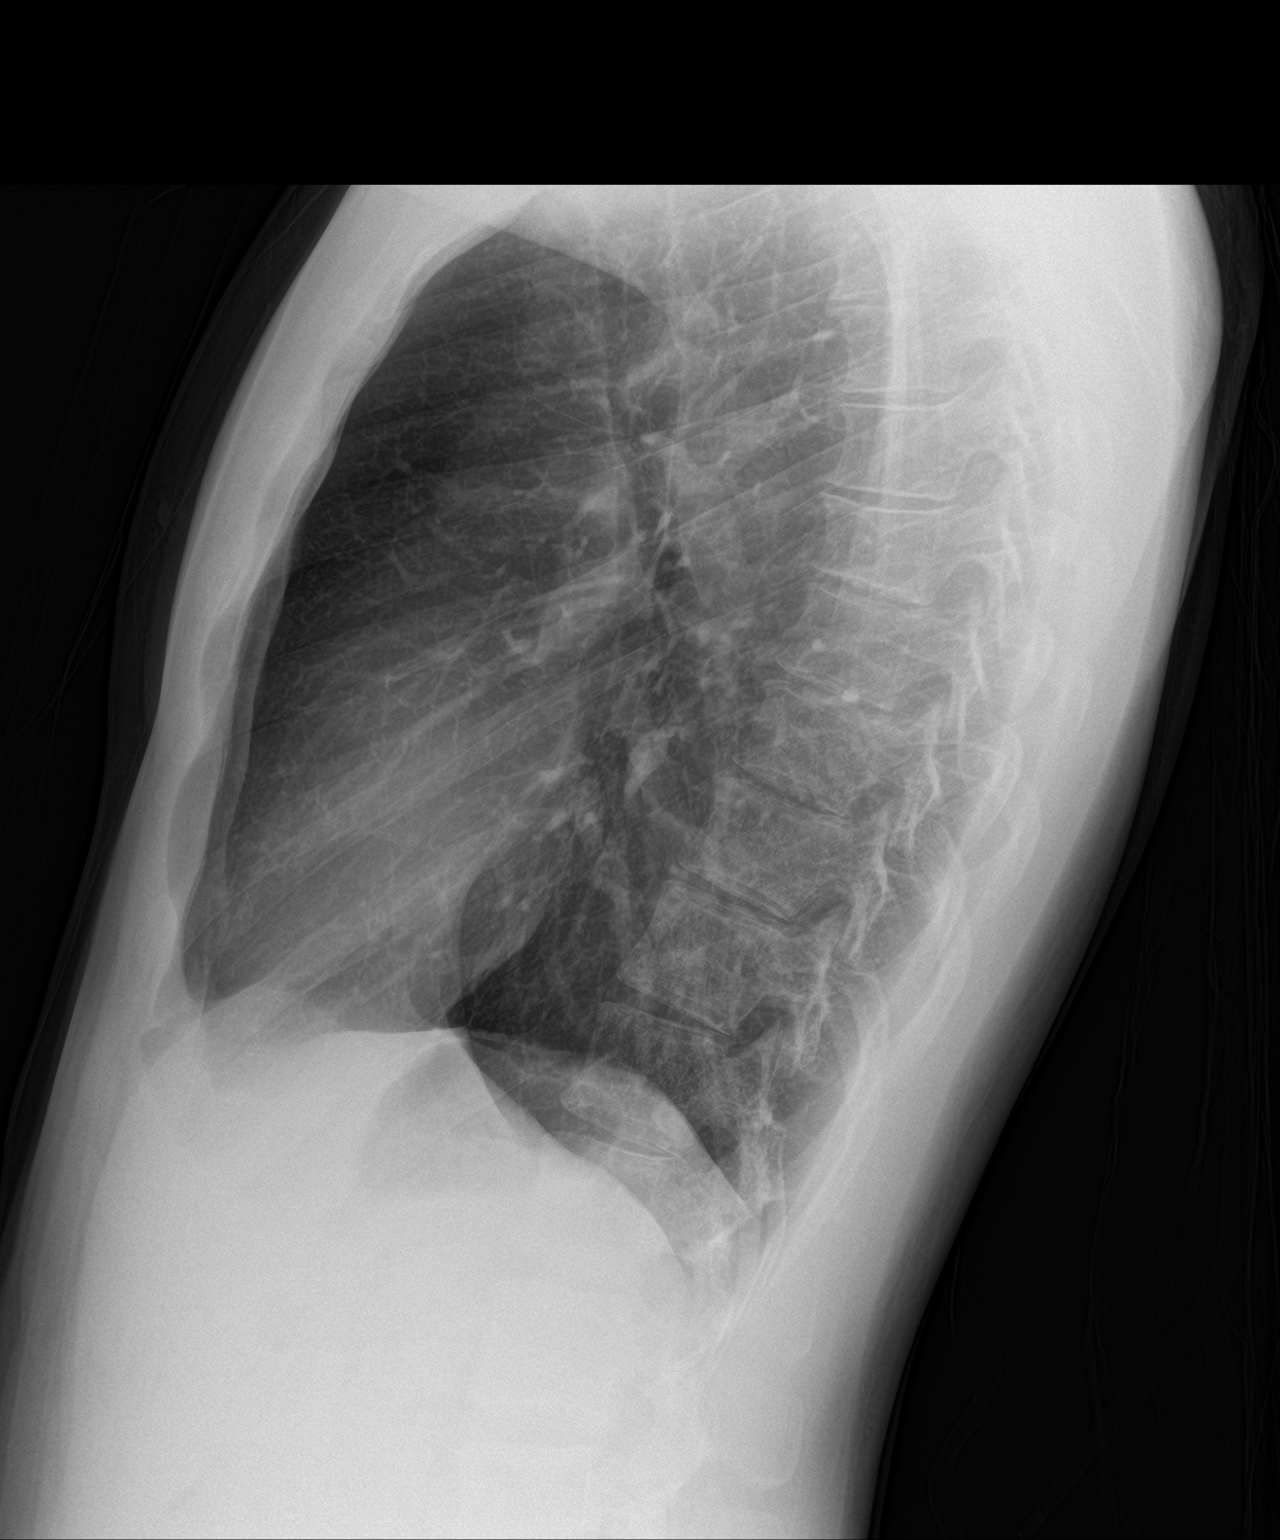

[2 of 2 positions shown; findings below may reference images not displayed]

FINDINGS: Normal heart size, mediastinal contours, and pulmonary vascularity.

Lungs hyperinflated with minimal central peribronchial thickening,
chronic.

No pulmonary infiltrate, pleural effusion or pneumothorax.

Bones unremarkable.
IMPRESSION: Hyperinflation and minimal chronic peribronchial thickening which
could reflect asthma.

No acute abnormalities.

## 2017-07-04 ENCOUNTER — Encounter: Payer: Self-pay | Admitting: Internal Medicine

## 2017-07-21 ENCOUNTER — Encounter (HOSPITAL_COMMUNITY): Payer: Self-pay | Admitting: Psychiatry

## 2017-07-21 ENCOUNTER — Ambulatory Visit (INDEPENDENT_AMBULATORY_CARE_PROVIDER_SITE_OTHER): Payer: BLUE CROSS/BLUE SHIELD | Admitting: Psychiatry

## 2017-07-21 VITALS — BP 120/68 | HR 88 | Ht 75.0 in | Wt 161.0 lb

## 2017-07-21 DIAGNOSIS — G4720 Circadian rhythm sleep disorder, unspecified type: Secondary | ICD-10-CM

## 2017-07-21 DIAGNOSIS — F1990 Other psychoactive substance use, unspecified, uncomplicated: Secondary | ICD-10-CM

## 2017-07-21 DIAGNOSIS — F319 Bipolar disorder, unspecified: Secondary | ICD-10-CM | POA: Diagnosis not present

## 2017-07-21 DIAGNOSIS — Z9114 Patient's other noncompliance with medication regimen: Secondary | ICD-10-CM | POA: Diagnosis not present

## 2017-07-21 NOTE — Patient Instructions (Signed)
Continue Lamictal 200 mg daily  STOP adderall, STOP Adderall XR  STOP Restoril  Melatonin 3 mg at SUNDOWN EVERY NIGHT  IF Melatonin 3 mg doesn't help correct sleep cycle after about 4-6 weeks, I want you to add a "happy light" aka UV Light bulb (get on [employer] for $30-40) in the morning and set an alarm for yourself to have early morning light exposure for about 30 minutes  AND Also continue melatonin 3 mg at sundown

## 2017-07-21 NOTE — Progress Notes (Signed)
[place] MD/PA/NP OP Progress Note  07/21/2017 10:24 AM Isaiah Garza  MRN:  [id number]  Chief Complaint: Still depressed, feel like I cannot think, sluggish HPI: Patient reports that he proceeded with the ketamine for a total of 6 doses, costing $2500, and reports that he completed treatment approximately 2 weeks ago.  He reports that he felt 0% better, and is extremely frustrated and discouraged.  This is despite my recommendation that he does not proceed with ketamine, and we proceed with more evidence-based treatments.  He reports that he also wanted to try being off of Latuda, so he stopped taking Latuda after tapering, his last dose was about 3 days ago.  He also cut Remeron in half and eventually discontinued about 3-4 days ago as well.  He reports that he uses Restoril sparingly, and does not often feel like he needs it.  Currently, he is only taking Lamictal 200 mg every morning, and Adderall twice a day.  He wonders about switching to 30 mg IR instead of the extended release.  I spent time weighing the risks and benefits of this, expressing my concern given his diagnosis of bipolar disorder.  I spent time learning about his sleep cycle and sleep schedule.  He reports that he usually stays up until about 5 AM, and will then sleep until about 1 PM.  He reports that he takes Adderall when he wakes up, and then takes another Adderall around 3 or 4 PM.  He then ends up staying up again until 5 AM every night.  I educated him on  the danger of stimulants especially in the context of what appears to be a circadian rhythm delay complicated by stimulant use.  I spent time with the patient educating him on the sleep-wake cycle, specifically discussing cycles of energy and arousal, varying with cycles of circadian rhythm.  I drew out a chart for him to show him how his circadian rhythm cycle is off, and this contributes to a disconnect with his awake arousal cycle.  I educated him that stimulants may worsen  this, and I am discontinuing the stimulants.  I also expressed concern that he continues to experiment with the friend's treatments, and over the past few months has experimented with cannabis, CBD oil, over-the-counter vitamins and natural supplements, but recently experimenting with ketamine, and often makes self-directed changes to his medications which he lets writer know about at follow-up.   I spent time educating him that this would most certainly affect his brains ability to reach homeostasis if were always introducing new neurochemicals.  I suggested that we keep a very simple regimen of Lamictal 200 mg, and melatonin nightly, and I wrote down instructions to help guide him back towards a homeostasis with his circadian rhythm.  He then asks Probation officer about mushrooms and LSD, and has read a little bit about how this can help depression, and the use of "micro dosing".  I instructed him that I absolutely do not recommend this, and I again echoed the need for a consistent medication regimen to allow his brain time to heal, so that we also have a better insight into his needs.  I also spent time with him educating him about personality structures that may contribute to difficulty with coping, and recommended he engage in DBT therapy, and provided him a referral to [facility] counseling.  This is within his insurance network and he was agreeable to making this appointment and engaging in care with their team.  Visit Diagnosis:  ICD-10-CM   1. Bipolar I disorder ([place]) F31.9   2. Substance use disorder F19.90   3. History of medication noncompliance Z91.14   4. 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 ADD (attention deficit disorder)   . ADHD (attention deficit hyperactivity disorder)    . Allergy   . Anxiety   . Arthritis    neck  . Asthma   . Bipolar 1 disorder ([place])   . Depression   . Pulmonary embolism ([place]) 10/2016    Past Surgical History:  Procedure Laterality Date  . NO PAST SURGERIES      Family Psychiatric History: See intake H&P for full details. Reviewed, with no updates at this time.   Family History:  Family History  Problem Relation Age of Onset  . Cancer Father        testicular  . ADD / ADHD Father   . Depression Father   . Alcohol abuse Mother   . Depression Mother   . Anxiety disorder Sister   . Bipolar disorder Maternal Grandmother   . Dementia Maternal Grandmother   . Bipolar disorder Maternal Aunt   . Bipolar disorder Cousin   . Diabetes Neg Hx   . Heart disease Neg Hx   . Early death Neg Hx   . Stroke Neg Hx     Social History:  Social History   Social History  . Marital status: Single    Spouse name: N/A  . Number of children: N/A  . Years of education: N/A   Occupational History  . cook    Social History Main Topics  . Smoking status: Never Smoker  . Smokeless tobacco: Never Used  . Alcohol use 0.0 oz/week     Comment: none at present  . Drug use: No     Comment: none at present. periodic use of marijuana lasted used holidays  . Sexual activity: Not Currently    Partners: Female    Birth control/ protection: Condom   Other Topics Concern  . None   Social History Narrative   Exercise - assorted (walking, running, cycling, and cardio)    Allergies: No Known Allergies  Metabolic Disorder Labs: Lab Results  Component Value Date   HGBA1C 5.3 09/18/2016   MPG 105 09/18/2016   No results found for: PROLACTIN Lab Results  Component Value Date   CHOL 109 09/18/2016   TRIG 95 09/18/2016   HDL 43 09/18/2016   CHOLHDL 2.5 09/18/2016   VLDL 19 09/18/2016   LDLCALC 47 09/18/2016   LDLCALC 79 01/16/2015   Lab Results  Component Value Date   TSH 1.448 09/18/2016    Therapeutic Level Labs: Lab  Results  Component Value Date   LITHIUM 1.20 09/18/2016   No results found for: VALPROATE No components found for:  CBMZ  Current Medications: Current Outpatient Prescriptions  Medication Sig Dispense Refill  . Cyanocobalamin (VITAMIN B 12 PO) Take 1 tablet by mouth.    . Glucosamine-Chondroit-Vit C-Mn (GLUCOSAMINE 1500 COMPLEX PO) Take 1 tablet by mouth every evening.    . lamoTRIgine (LAMICTAL) 200 MG tablet Take 1 tablet (200 mg total) by mouth daily. 90 tablet 1  . multivitamin-iron-minerals-folic acid (CENTRUM) chewable tablet Chew 1 tablet by mouth daily.    . Omega-3 Fatty Acids (FISH OIL)  1000 MG CAPS Take 1 capsule by mouth 2 (two) times daily.    Marland Kitchen tiZANidine (ZANAFLEX) 2 MG tablet Take by mouth 2 (two) times daily.     No current facility-administered medications for this visit.      Musculoskeletal: Strength & Muscle Tone: within normal limits Gait & Station: normal Patient leans: N/A  Psychiatric Specialty Exam: ROS  Blood pressure 120/68, pulse 88, height '6\' 3"'$  (1.905 m), weight 161 lb (73 kg).Body mass index is 20.12 kg/m.  General Appearance: Casual and Fairly Groomed  Eye Contact:  Fair  Speech:  Clear and Coherent  Volume:  Normal  Mood:  Depressed and Dysphoric  Affect:  Congruent  Thought Process:  Goal Directed and Descriptions of Associations: Intact  Orientation:  Full (Time, Place, and Person)  Thought Content: Logical   Suicidal Thoughts:  No  Homicidal Thoughts:  No  Memory:  Immediate;   Fair  Judgement:  Poor  Insight:  Shallow  Psychomotor Activity:  Normal  Concentration:  Concentration: Fair  Recall:  [employer] of Knowledge: Fair  Language: Good  Akathisia:  Negative  Handed:  Right  AIMS (if indicated): not done  Assets:  Communication Skills Desire for Improvement Housing Transportation  ADL's:  Intact  Cognition: WNL  Sleep:  Circadian rhythm delay   Screenings: AUDIT     Admission (Discharged) from 09/13/2016 in [place]  Alcohol Use Disorder Identification Test Final Score (AUDIT)  2    ECT-MADRS     ECT Treatment from 02/28/2017 in [place] ECT Treatment from 02/21/2017 in [place]  MADRS Total Score  30  28    Mini-Mental     ECT Treatment from 02/28/2017 in [place] ECT Treatment from 02/21/2017 in [place]  Total Score (max 30 points )  30  28    PHQ2-9     Office Visit from 02/04/2017 in [facility] Office Visit from 12/30/2016 in [facility] Office Visit from 01/16/2015 in [facility] from 05/02/2013 in [facility]  PHQ-2 Total Score  6  5  0  0  PHQ-9 Total Score  21  19  -  -       Assessment and Plan: Isaiah Garza is a 26 year old male with bipolar depression, who presents today for medication management follow-up.  His course has been complicated by inconsistent medication adherence, and self-directed changes in medications, in addition complicated by his experimentation with multiple substances.  He has been tried on a myriad of evidence-based treatments for depression, and specifically for bipolar depression, in addition to treatment for ADHD.  None of these interventions have provided any meaningful or sustained improvement, due to poor adherence and substance use.  He presents today now sharing with Probation officer that he received ketamine treatment despite my recommendations that we hold off on proceeding with this.  He reports that he had absolutely no improvement with ketamine, and this cost him approximately $2500.  He also shares that he is tapered off of his antipsychotic, Latuda, and tapered off of his sleep aid Remeron, and continues to use Adderall extended release at extremely inappropriate times of the  day due to circadian rhythm sleep disorder.  I spent ample time today providing psychoeducation about the danger of these abrupt and unguided medication changes, the danger  of substances, as he continues to be curious about things such as LSD and mushrooms that he believes may improve his depression.  He does not present with any acute psychosis or mania, but appears to have difficulty with accepting some element with his illness, and turns to maladaptive ways of coping with this.  I believe he would benefit from DBT, and a consistent structured and simplified medication regimen.  I will keep the patient on Lamictal 200 mg daily for mood stability, and have suggested he worked diligently as the instructions I provided to help correct his circadian rhythm sleep disorder.  I have also recommended he participate in DBT and he is agreeable to this referral.  He shares with Probation officer that he has not followed up with his therapist for over 1 month, due to not paying his therapy appointment bills.  He presents with therapy interfering behaviors, and if he continues to engage in such behaviors, we will need to consider an increased level of care such as IOP or PHP.  1. Bipolar I disorder ([place])   2. Substance use disorder   3. History of medication noncompliance   4. Circadian rhythm sleep disorder     Status of current problems: unchanged  Labs Ordered: No orders of the defined types were placed in this encounter.   Labs Reviewed: NA  Collateral Obtained/Records Reviewed: N/A  Plan:  Patient self discontinued Latuda and Remeron Discontinue Adderall given inappropriate use and discontinue Restoril given an appropriate timing of administration Recommend patient use melatonin 3 mg nightly at sundown, and follow instructions to address circadian rhythm delay Continue Lamictal 200 mg daily Recommend DBT therapy; referral provided  I spent 40 minutes with the patient in direct face-to-face clinical care.   Greater than 50% of this time was spent in counseling and coordination of care with the patient.    Aundra Dubin, MD 07/21/2017, 10:24 AM

## 2017-08-15 ENCOUNTER — Telehealth (HOSPITAL_COMMUNITY): Payer: Self-pay

## 2017-08-15 NOTE — Telephone Encounter (Signed)
Thank you, we have tried a myriad of approaches, and his case is complicated by substance use.  Id like him to participate in the [facility] program for more intensive care.

## 2017-08-15 NOTE — Telephone Encounter (Signed)
Medication management - Attempted to contact pt. back after he left a message he has had increased problems with sleep and focus since taken off Adderall and Restoril.  Was trying to contact Dr. Rene KocherEksir to inform as both medications were discontinued at last evaluation.  Could not leave patient a message as his voicemail was full.

## 2017-08-16 NOTE — Telephone Encounter (Signed)
Thank you Shawn!

## 2017-08-16 NOTE — Telephone Encounter (Signed)
Medication management - Telephone call with pt to inform Dr. Rene KocherEksir would like him to consider trying PHP to work on improving symptom management and to address current symptoms. Explained the program to pt and he is receptive to try it.  Informed this nurse would have one of the PHP counselors call him back to set up his initial evaluation/CCA and patient verified his phone number was the best way to reach him.  Patient stated understanding the program was 5 days a week, M-F, and from 9-2 and is still interested.  Will have counselor contact patient to schedule.

## 2017-08-16 NOTE — Telephone Encounter (Signed)
Just spoke to pt and he will be coming in for a CCA tomorrow, 11/28 at 2:30

## 2017-08-17 ENCOUNTER — Other Ambulatory Visit (HOSPITAL_COMMUNITY): Payer: BLUE CROSS/BLUE SHIELD | Attending: Psychiatry | Admitting: Professional

## 2017-08-17 DIAGNOSIS — F314 Bipolar disorder, current episode depressed, severe, without psychotic features: Secondary | ICD-10-CM | POA: Insufficient documentation

## 2017-08-17 NOTE — Telephone Encounter (Signed)
Excellent thank you

## 2017-08-18 ENCOUNTER — Other Ambulatory Visit (HOSPITAL_COMMUNITY): Payer: BLUE CROSS/BLUE SHIELD

## 2017-08-18 NOTE — Psych (Signed)
Comprehensive Clinical Assessment (CCA) Note  08/18/2017 Isaiah NeighborsCharles A Garza [id number]  Visit Diagnosis:      ICD-10-CM   1. Bipolar 1 disorder, depressed, severe (HCC) F31.4       CCA Part One  Part One has been completed on paper by the patient.  (See scanned document in Chart Review)  CCA Part Two A  Intake/Chief Complaint:  CCA Intake With Chief Complaint CCA Part Two Date: 08/17/17 CCA Part Two Time: 1500 Chief Complaint/Presenting Problem: Pt was referred by Dr. Rene KocherEksir for PHP. Patient reported that he had a manic episode one year ago and has not been able to carry out his normal routine since. Patient stated that he has not been able to "return back to normal." Patient has a hard time getting out of bed and sleeping an excessive amount than what he is used to before his depression started. Patient has lost all interest in his normal leisure activities including hanging out with friends, working out, riding his unicycle, and dancing. Pt reports he is unable to hold a job due to his depression symptoms and lives at home with his mother due to lack of finances. Patient shared that he does not enjoy his life anymore. Pt reports he is experiencing passive SI; for example "I just wish I didn't exist." Patient reported that he has experienced several manic episdes in the past that has required hospitalization, but never has resulted in depression until now. Patient says that he does have some anxiety that leads to him have racing thoughts about everything,. Patient shared that he feels that he can only sleep and watch videos throughout the day. Pt reports he did ECT in July '18 with some success and Ketamine in Aug. '18 with no success.   Patients Currently Reported Symptoms/Problems: Patient reports that his depression has increased since his last manic episode one year ago. Pt states he is experiencing passive SI. Patient also shared that he has been experiencing some anxiety as well. Patient  stated that he has had mood swings, appetite changes, memory problems, loss of interest, low energy, obssesive thoughts, a change in sexual interest, and poor concentration. Patient shared that he has noticed a change in his sleep cycle, sleeping between 10-14 hours a night and has trouble staying awake once he wakes up. Patient shared that because of these systems he feels miserable all the time and does not want to do anything. Individual's Strengths: Some insight; some motivation for treatment Type of Services Patient Feels Are Needed: Pt states he is searching for anything that will help him manage the depression symptoms so he can "get closer to my 'normal' and function."  Mental Health Symptoms Depression:  Depression: Change in energy/activity, Difficulty Concentrating, Fatigue, Hopelessness, Sleep (too much or little), Worthlessness  Mania:     Anxiety:      Psychosis:     Trauma:     Obsessions:     Compulsions:     Inattention:     Hyperactivity/Impulsivity:     Oppositional/Defiant Behaviors:     Borderline Personality:     Other Mood/Personality Symptoms:      Mental Status Exam Appearance and self-care  Stature:  Stature: Tall  Weight:  Weight: Average weight  Clothing:  Clothing: Casual  Grooming:  Grooming: Normal  Cosmetic use:  Cosmetic Use: None  Posture/gait:  Posture/Gait: Normal  Motor activity:  Motor Activity: Not Remarkable  Sensorium  Attention:  Attention: Normal  Concentration:  Concentration: Normal  Orientation:  Orientation: X5  Recall/memory:  Recall/Memory: Normal  Affect and Mood  Affect:  Affect: Depressed  Mood:  Mood: Depressed  Relating  Eye contact:  Eye Contact: Fleeting  Facial expression:  Facial Expression: Depressed  Attitude toward examiner:  Attitude Toward Examiner: Cooperative  Thought and Language  Speech flow: Speech Flow: Normal  Thought content:  Thought Content: Appropriate to mood and circumstances  Preoccupation:      Hallucinations:     Organization:     Company secretary of Knowledge:  Fund of Knowledge: Average  Intelligence:  Intelligence: Average  Abstraction:  Abstraction: Normal  Judgement:  Judgement: Poor  Reality Testing:  Reality Testing: Adequate  Insight:  Insight: Fair  Decision Making:  Decision Making: Only simple  Social Functioning  Social Maturity:  Social Maturity: Isolates(Pt reports isolating is a new thing for him. He does not want to be around others because "I don't want them to see what I'm like now. I'm not who I was.")  Social Judgement:     Stress  Stressors:  Stressors: Illness, Work, Arts administrator, Housing(Pt wants to get his depression symptoms under control so he can start working and move out of his mother's home.)  Coping Ability:  Coping Ability: Horticulturist, commercial Deficits:     Supports:      Family and Psychosocial History: Family history Marital status: Single  Childhood History:  Childhood History By whom was/is the patient raised?: Both parents Additional childhood history information: Pt's parents divorced when pt was 41 Description of patient's relationship with caregiver when they were a child: Pt reports his relationship with his mother was strained due to her abuse of alcoholism.  Pt reports she would "go into rages" after drinking too much, though they were never directed at him.  Pt reports his father was "flighty" but there when he needed him. Patient's description of current relationship with people who raised him/her: Pt currently lives with his mother and states their relationship is good.  Pt reports she is very supportive and patient with him.  Pt reports he sees his dad about once a month and they have a "decent" relationship. Does patient have siblings?: Yes Number of Siblings: 1 Description of patient's current relationship with siblings: 1 younger sister; Pt states his relationship with his sister is great and he talks to her often and sees  her about once a month. Did patient suffer any verbal/emotional/physical/sexual abuse as a child?: No Did patient suffer from severe childhood neglect?: No Has patient ever been sexually abused/assaulted/raped as an adolescent or adult?: No Was the patient ever a victim of a crime or a disaster?: No Witnessed domestic violence?: No Has patient been effected by domestic violence as an adult?: No  CCA Part Two B  Employment/Work Situation: Employment / Work Psychologist, occupational Employment situation: Biomedical scientist job has been impacted by current illness: Yes Describe how patient's job has been impacted: Pt is unable to get another job due to his current depression symptoms What is the longest time patient has a held a job?: 2 years Where was the patient employed at that time?: [employer] Has patient ever been in the [employer]?: No Are There Guns or Other Weapons in Your Home?: No  Education: Education Did Garment/textile technologist From [employer]?: Yes Did You Have Any Difficulty At [employer]?: Yes("concentration") Were Any Medications Ever Prescribed For These Difficulties?: Yes Medications Prescribed For School Difficulties?: Adderall  Religion: Religion/Spirituality Are You A Religious Person?: No  Leisure/Recreation: Leisure /  Recreation Leisure and Hobbies: dance, be outside, [place], hang out with friends, watch videos online  Exercise/Diet: Exercise/Diet Do You Exercise?: No Have You Gained or Lost A Significant Amount of Weight in the Past Six Months?: No Do You Follow a Special Diet?: Yes Type of Diet: Vegetarian Do You Have Any Trouble Sleeping?: Yes Explanation of Sleeping Difficulties: Pt reports he sleeps too much and typically goes to sleep around 3a and gets up around 2p.  CCA Part Two C  Alcohol/Drug Use: Alcohol / Drug Use Pain Medications: See MAR Prescriptions: See MAR Over the Counter: See MAR History of alcohol / drug use?: Yes(Pt reports he experimented with  numerous drugs in high school and college.  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  Pt reports he was smoking pot almost daily until a couple of months ago) Substance #1 Name of Substance 1: Cannabis(Pt reports he has had no trouble due to the use, such as financial, legal...) 1 - Age of First Use: "high school" 1 - Amount (size/oz): "unsure" 1 - Frequency: "almost daily until a few months ago" 1 - Duration: "for a few years" 1 - Last Use / Amount: "a few months ago"    CCA Part Three  ASAM's:  Six Dimensions of Multidimensional Assessment  Dimension 1:  Acute Intoxication and/or Withdrawal Potential:     Dimension 2:  Biomedical Conditions and Complications:     Dimension 3:  Emotional, Behavioral, or Cognitive Conditions and Complications:     Dimension 4:  Readiness to Change:     Dimension 5:  Relapse, Continued use, or Continued Problem Potential:     Dimension 6:  Recovery/Living Environment:      Substance use Disorder (SUD)    Social Function:  Social Functioning Social Maturity: Isolates(Pt reports isolating is a new thing for him. He does not want to be around others because "I don't want them to see what I'm like now. I'm not who I was.")  Stress:  Stress Stressors: Illness, Work, Arts administrator, Housing(Pt wants to get his depression symptoms under control so he can start working and move out of his mother's home.) Coping Ability: Exhausted Patient Takes Medications The Way The Doctor Instructed?: Yes Priority Risk: Moderate Risk  Risk Assessment- Self-Harm Potential: Risk Assessment For Self-Harm Potential Thoughts of Self-Harm: Vague current thoughts Method: No plan Availability of Means: No access/NA Additional Information for Self-Harm Potential: Acts of Self-harm Additional Comments for Self-Harm Potential: Pt reports he cut himself some in high school but has not since.  Pt reports he has not tried to  commit suicide.  Risk Assessment -Dangerous to Others Potential: Risk Assessment For Dangerous to Others Potential Method: No Plan  DSM5 Diagnoses: Patient Active Problem List   Diagnosis Date Noted  . Bipolar I disorder (HCC) 11/03/2016  . Cannabis use disorder, moderate, in controlled environment (HCC) 08/08/2016    Recommendations for Services/Supports/Treatments: Recommendations for Services/Supports/Treatments Recommendations For Services/Supports/Treatments: Partial Hospitalization(Pt needs to gain coping skills. Pt reports he is worried about being able to get up to come to group. Pt reports he wants to call ins to see how much group will cost.)    Referrals to Alternative Service(s): Referred to Alternative Service(s):   Place:   Date:   Time:    Referred to Alternative Service(s):   Place:   Date:   Time:    Referred to Alternative Service(s):   Place:  Date:   Time:    Referred to Alternative Service(s):   Place:   Date:   Time:     Quinn AxeWhitney J Slayter Moorhouse, LPCA

## 2017-08-19 ENCOUNTER — Telehealth (HOSPITAL_COMMUNITY): Payer: Self-pay | Admitting: Professional

## 2017-08-19 ENCOUNTER — Other Ambulatory Visit (HOSPITAL_COMMUNITY): Payer: Self-pay

## 2017-08-22 ENCOUNTER — Other Ambulatory Visit (HOSPITAL_COMMUNITY): Payer: Self-pay

## 2017-08-23 ENCOUNTER — Other Ambulatory Visit (HOSPITAL_COMMUNITY): Payer: Self-pay

## 2017-08-23 ENCOUNTER — Ambulatory Visit (HOSPITAL_COMMUNITY): Payer: Self-pay

## 2017-08-24 ENCOUNTER — Other Ambulatory Visit (HOSPITAL_COMMUNITY): Payer: Self-pay

## 2017-08-25 ENCOUNTER — Ambulatory Visit (HOSPITAL_COMMUNITY): Payer: Self-pay

## 2017-08-25 ENCOUNTER — Ambulatory Visit: Payer: Self-pay | Admitting: Internal Medicine

## 2017-08-25 ENCOUNTER — Other Ambulatory Visit (HOSPITAL_COMMUNITY): Payer: Self-pay

## 2017-08-26 ENCOUNTER — Other Ambulatory Visit (HOSPITAL_COMMUNITY): Payer: Self-pay

## 2017-08-29 ENCOUNTER — Other Ambulatory Visit (HOSPITAL_COMMUNITY): Payer: Self-pay

## 2017-08-30 ENCOUNTER — Other Ambulatory Visit (HOSPITAL_COMMUNITY): Payer: Self-pay

## 2017-08-30 ENCOUNTER — Ambulatory Visit (HOSPITAL_COMMUNITY): Payer: Self-pay

## 2017-08-31 ENCOUNTER — Telehealth (HOSPITAL_COMMUNITY): Payer: Self-pay

## 2017-08-31 ENCOUNTER — Other Ambulatory Visit (HOSPITAL_COMMUNITY): Payer: Self-pay

## 2017-08-31 DIAGNOSIS — F319 Bipolar disorder, unspecified: Secondary | ICD-10-CM

## 2017-08-31 NOTE — Telephone Encounter (Signed)
Patient is calling to let you know that he can not afford PHP. He said he is still not sleeping and would really like to have something for sleep. Please review and advise, thank you

## 2017-09-01 ENCOUNTER — Other Ambulatory Visit (HOSPITAL_COMMUNITY): Payer: Self-pay

## 2017-09-01 ENCOUNTER — Ambulatory Visit (HOSPITAL_COMMUNITY): Payer: Self-pay

## 2017-09-01 MED ORDER — RAMELTEON 8 MG PO TABS: 8.0000 mg | ORAL_TABLET | Freq: Every day | ORAL | 0 refills | Status: DC

## 2017-09-01 NOTE — Telephone Encounter (Signed)
We can send ramelteon 8 mg nightly - it should be taken every night at 7 Pm. 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

## 2017-09-01 NOTE — Telephone Encounter (Signed)
Shawn, can you take care of this please? Thank you

## 2017-09-01 NOTE — Telephone Encounter (Signed)
Medication management - Telephone call with pt. to inform Dr. Rene KocherEksir would prescribe him Ramelteon 8mg , one at 7pm nightly but not sure what his co-payment may be. Pt. to call back if too high or needs a Prior Authorization completed.  Agreed to call in new order to patient's [facility] on [address] and patient will call us back if any problems getting if filled.  Called [facility] and spoke with Crystal, pharmacist to provide one time 30 day order for Ramelteon 8mg , one at 7pm nightly.  Patient to let us know later how the medication is working and if will need another order prior to appointment 10/17/17.

## 2017-09-02 ENCOUNTER — Other Ambulatory Visit (HOSPITAL_COMMUNITY): Payer: Self-pay

## 2017-09-02 ENCOUNTER — Telehealth (HOSPITAL_COMMUNITY): Payer: Self-pay

## 2017-09-02 DIAGNOSIS — G4720 Circadian rhythm sleep disorder, unspecified type: Principal | ICD-10-CM

## 2017-09-02 DIAGNOSIS — F518 Other sleep disorders not due to a substance or known physiological condition: Secondary | ICD-10-CM

## 2017-09-02 MED ORDER — ZALEPLON 10 MG PO CAPS: 10.0000 mg | ORAL_CAPSULE | Freq: Every day | ORAL | 0 refills | Status: DC

## 2017-09-02 NOTE — Telephone Encounter (Signed)
Medication management - Telephone call with pt after meeting with Dr. Rene KocherEksir to inform Rozerem required other medications to be tried first to be covered by his insurance. Dr. Rene KocherEksir wants pt. to try Sonata 10 mg daily at 10 pm to help with sleep difficulties.   Patient stated understanding and verified with Dr. Rene KocherEksir again to make sure this was for 10 mg at 10 pm daily.  Called [facility] on [address] to cancel Rozerem order and provided 30 day prescription order for Sonata 10 mg, one capsule daily at 10 pm, #30 with no refills to Ty, pharmacist.  Patient to call back if any problems getting the medication and to let us know if helps with sleep.

## 2017-09-05 ENCOUNTER — Encounter (HOSPITAL_COMMUNITY): Payer: Self-pay | Admitting: Psychiatry

## 2017-09-05 ENCOUNTER — Other Ambulatory Visit (HOSPITAL_COMMUNITY): Payer: Self-pay

## 2017-09-06 ENCOUNTER — Other Ambulatory Visit (HOSPITAL_COMMUNITY): Payer: Self-pay

## 2017-09-06 ENCOUNTER — Ambulatory Visit (HOSPITAL_COMMUNITY): Payer: Self-pay

## 2017-09-07 ENCOUNTER — Other Ambulatory Visit (HOSPITAL_COMMUNITY): Payer: Self-pay

## 2017-09-08 ENCOUNTER — Ambulatory Visit (HOSPITAL_COMMUNITY): Payer: Self-pay

## 2017-09-08 ENCOUNTER — Ambulatory Visit (INDEPENDENT_AMBULATORY_CARE_PROVIDER_SITE_OTHER): Payer: BLUE CROSS/BLUE SHIELD | Admitting: Internal Medicine

## 2017-09-08 ENCOUNTER — Encounter: Payer: Self-pay | Admitting: Internal Medicine

## 2017-09-08 ENCOUNTER — Other Ambulatory Visit (HOSPITAL_COMMUNITY): Payer: Self-pay

## 2017-09-08 VITALS — BP 116/74 | HR 114 | Temp 97.9°F | Wt 156.0 lb

## 2017-09-08 DIAGNOSIS — Z86711 Personal history of pulmonary embolism: Secondary | ICD-10-CM | POA: Diagnosis not present

## 2017-09-08 DIAGNOSIS — F3177 Bipolar disorder, in partial remission, most recent episode mixed: Secondary | ICD-10-CM

## 2017-09-08 DIAGNOSIS — Z23 Encounter for immunization: Secondary | ICD-10-CM | POA: Diagnosis not present

## 2017-09-08 DIAGNOSIS — F32A Depression, unspecified: Secondary | ICD-10-CM

## 2017-09-08 DIAGNOSIS — F419 Anxiety disorder, unspecified: Secondary | ICD-10-CM | POA: Diagnosis not present

## 2017-09-08 DIAGNOSIS — F329 Major depressive disorder, single episode, unspecified: Secondary | ICD-10-CM

## 2017-09-09 ENCOUNTER — Other Ambulatory Visit (HOSPITAL_COMMUNITY): Payer: Self-pay

## 2017-09-11 ENCOUNTER — Encounter: Payer: Self-pay | Admitting: Internal Medicine

## 2017-09-11 NOTE — Patient Instructions (Signed)
Pulmonary Embolism A pulmonary embolism (PE) is a sudden blockage or decrease of blood flow in one lung or both lungs. Most blockages come from a blood clot that forms in a lower leg, thigh, or arm vein (deep vein thrombosis, DVT) and travels to the lungs. A clot is blood that has thickened into a gel or solid. PE is a dangerous and life-threatening condition that needs to be treated right away. What are the causes? This condition is usually caused by a blood clot that forms in a vein and moves to the lungs. In rare cases, it may be caused by air, fat, part of a tumor, or other tissue that moves through the veins and into the lungs. What increases the risk? The following factors may make you more likely to develop this condition:  Having DVT or a history of DVT.  Being older than age 60.  Personal or family history of blood clots or blood clotting disease.  Major or lengthy surgery.  Orthopedic surgery, especially hip or knee replacement.  Traumatic injury, such as breaking a hip or leg.  Spinal cord injury.  Stroke.  Taking medicines that contain estrogen. These include birth control pills and hormone replacement therapy.  Long-term (chronic) lung or heart disease.  Cancer and chemotherapy.  Having a central venous catheter.  Pregnancy and the period after delivery.  What are the signs or symptoms? Symptoms of this condition usually start suddenly and include:  Shortness of breath while active or at rest.  Coughing or coughing up blood or blood-tinged mucus.  Chest pain that is often worse with deep breaths.  Rapid or irregular heartbeat.  Feeling light-headed or dizzy.  Fainting.  Feeling anxious.  Sweating.  Pain and swelling in a leg. This is a symptom of DVT, which can lead to PE.  How is this diagnosed? This condition may be diagnosed based on:  Your medical history.  A physical exam.  Blood tests to check blood oxygen level and how well your blood  clots, and a D-dimer blood test, which checks your blood for a substance that is released when a blood clot breaks apart.  CT pulmonary angiogram. This test checks blood flow in and around your lungs.  Ventilation-perfusion scan, also called a lung VQ scan. This test measures air flow and blood flow to the lungs.  Ultrasound of the legs to look for blood clots.  How is this treated? Treatment for this conditions depends on many factors, such as the cause of your PE, your risk for bleeding or developing more clots, and other medical conditions you have. Treatment aims to remove, dissolve, or stop blood clots from forming or growing larger. Treatment may include:  Blood thinning medicines (anticoagulants) to stop clots from forming or growing. These medicines may be given as a pill, as an injection, or through an IV tube (infusion).  Medicines that dissolve clots (thrombolytics).  A procedure in which a flexible tube is used to remove a blood clot (embolectomy) or deliver medicine to destroy it (catheter-directed thrombolysis).  A procedure in which a filter is inserted into a large vein that carries blood to the heart (inferior vena cava). This filter (vena cava filter) catches blood clots before they reach the lungs.  Surgery to remove the clot (surgical embolectomy). This is rare.  You may need a combination of immediate, long-term (up to 3 months after diagnosis), and extended (more than 3 months after diagnosis) treatments. Your treatment may continue for several months (maintenance therapy).   You and your health care provider will work together to choose the treatment program that is best for you. Follow these instructions at home: If you are taking an anticoagulant medicine:  Take the medicine every day at the same time each day.  Understand what foods and drugs interact with your medicine.  Understand the side effects of this medicine, including excessive bruising or bleeding. Ask  your health care provider or pharmacist about other side effects. General instructions  Take over-the-counter and prescription medicines only as told by your health care provider.  Anticoagulant medicines may cause side effects, including easy bruising and difficulty stopping bleeding. If you are prescribed an anticoagulant: ? Hold pressure over cuts for longer than usual. ? Tell your dentist and other health care providers that you are taking anticoagulants before you have any procedure that may cause bleeding. ? Avoid contact sports. ? Be extra careful when handling sharp objects. ? Use a soft toothbrush. Floss with waxed dental floss. ? Shave with an electric razor.  Wear a medical alert bracelet or carry a medical alert card that says you have had a PE.  Ask your health care provider when you may return to your normal activities.  Talk with your health care provider about any travel plans. It is important to make sure that you are still able to take your medicine while on trips.  Keep all follow-up visits as told by your health care provider. This is important. How is this prevented? Take these actions to lower your risk of developing another PE:  Exercise regularly. Take frequent walks. For at least 30 minutes every day, engage in: ? Activity that involves moving your arms and legs. ? Activity that encourages good blood flow through your body by increasing your heart rate.  While traveling, drink plenty of water and avoid drinking alcohol. Ask your health care provider if you should wear below-the-knee compression stockings.  Avoid sitting or lying in bed for long periods of time without moving your legs. Exercise your arms and legs every hour during long-distance travel (over 4 hours).  If you are hospitalized or have surgery, ask your health care provider about your risks and what treatments can help prevent blood clots.  Maintain a healthy weight. Ask your health care  provider what weight is healthy for you.  If you are a woman who is over age 35, avoid unnecessary use of medicines that contain estrogen, including birth control pills.  Do not use any products that contain nicotine or tobacco, such as cigarettes and e-cigarettes. This is especially important if you take estrogen medicines. If you need help quitting, ask your health care provider.  See your health care provider for regular checkups. This may include blood tests and ultrasound testing on your legs to check for new blood clots.  Contact a health care provider if:  You missed a dose of your blood thinner medicine. Get help right away if:  You have new or increased pain, swelling, warmth, or redness in an arm or leg.  You have numbness or tingling in an arm or leg.  You have shortness of breath while active or at rest.  You have chest pain.  You have a rapid or irregular heartbeat.  You feel light-headed or dizzy.  You cough up blood.  You have blood in your vomit, stool, or urine.  You have a fever.  You have abdomen (abdominal) pain.  You have a severe fall or head injury.  You have a   severe headache.  You have vision changes.  You cannot move your arms or legs.  You are confused or have memory loss.  You are bleeding for 10 minutes or more, even with strong pressure on the wound. These symptoms may represent a serious problem that is an emergency. Do not wait to see if the symptoms will go away. Get medical help right away. Call your local emergency services (911 in the [place] Do not drive yourself to the hospital. Summary  A pulmonary embolism (PE) is a sudden blockage or decrease of blood flow in one lung or both lungs. PE is a dangerous and life-threatening condition that needs to be treated right away.  Having deep vein thrombosis (DVT) or a history of DVT is the most common risk factor for PE.  Treatments for this condition usually include medicines to thin  your blood (anticoagulants) or medicines to break apart blood clots (thrombolytics).  If you are prescribed blood thinners, it is important to take the medicine every single day at the same time each day.  If you have signs of PE or DVT, call your local emergency services (911 in the [place] This information is not intended to replace advice given to you by your health care provider. Make sure you discuss any questions you have with your health care provider. Document Released: 09/03/2000 Document Revised: 10/09/2016 Document Reviewed: 10/09/2016 Elsevier Interactive Patient Education  2018 Elsevier Inc.  

## 2017-09-11 NOTE — Progress Notes (Signed)
Subjective:    Patient ID: Isaiah Garza, male    DOB: 1991/06/16, 26 y.o.   MRN: [id number]  HPI  Pt presents to the clinic today to follow up PE. He was diagnosed 10/2016, during a hospitalization for Bipolar. He was treated with 6 months of Eliquis. He was following with [facility], but reports he never went back for the workup for clotting disorders. He reports he has changed insurances and needs a referral to hematology in the [facility] system to complete the workup. He is off the Eliquis and denies issues with chest pain or shortness of breath.   Additionally, he tells me that he is currently in between psychiatrist. He has an appt with a new 09/22/17. He would like to know if I would prescribe his Clonazepam 1 mg BID until he can see his new psychiatrist.  Review of Systems      Past Medical History:  Diagnosis Date  . ADD (attention deficit disorder)   . ADHD (attention deficit hyperactivity disorder)   . Allergy   . Anxiety   . Arthritis    neck  . Asthma   . Bipolar 1 disorder (HCC)   . Depression   . Pulmonary embolism (HCC) 10/2016    Current Outpatient Medications  Medication Sig Dispense Refill  . amphetamine-dextroamphetamine (ADDERALL) 20 MG tablet Take 1 tablet by mouth 3 (three) times daily.  0  . carisoprodol (SOMA) 350 MG tablet Take 350 mg by mouth 2 (two) times daily as needed.  0  . Cyanocobalamin (VITAMIN B 12 PO) Take 1 tablet by mouth.    . Glucosamine-Chondroit-Vit C-Mn (GLUCOSAMINE 1500 COMPLEX PO) Take 1 tablet by mouth every evening.    . lamoTRIgine (LAMICTAL) 200 MG tablet Take 1 tablet (200 mg total) by mouth daily. 90 tablet 1  . multivitamin-iron-minerals-folic acid (CENTRUM) chewable tablet Chew 1 tablet by mouth daily.    . Omega-3 Fatty Acids (FISH OIL) 1000 MG CAPS Take 1 capsule by mouth 2 (two) times daily.    Marland Kitchen tiZANidine (ZANAFLEX) 2 MG tablet Take by mouth 2 (two) times daily.    . zaleplon (SONATA) 10 MG capsule Take 1 capsule (10 mg  total) by mouth daily at 10 pm. 30 capsule 0   No current facility-administered medications for this visit.     No Known Allergies  Family History  Problem Relation Age of Onset  . Cancer Father        testicular  . ADD / ADHD Father   . Depression Father   . Alcohol abuse Mother   . Depression Mother   . Anxiety disorder Sister   . Bipolar disorder Maternal Grandmother   . Dementia Maternal Grandmother   . Bipolar disorder Maternal Aunt   . Bipolar disorder Cousin   . Diabetes Neg Hx   . Heart disease Neg Hx   . Early death Neg Hx   . Stroke Neg Hx     Social History   Socioeconomic History  . Marital status: Single    Spouse name: Not on file  . Number of children: Not on file  . Years of education: Not on file  . Highest education level: Not on file  Social Needs  . Financial resource strain: Not on file  . Food insecurity - worry: Not on file  . Food insecurity - inability: Not on file  . Transportation needs - medical: Not on file  . Transportation needs - non-medical: Not on file  Occupational  History  . Occupation: cook  Tobacco Use  . Smoking status: Never Smoker  . Smokeless tobacco: Never Used  Substance and Sexual Activity  . Alcohol use: Yes    Alcohol/week: 0.0 oz    Comment: none at present  . Drug use: No    Comment: none at present. periodic use of marijuana lasted used holidays  . Sexual activity: Not Currently    Partners: Female    Birth control/protection: Condom  Other Topics Concern  . Not on file  Social History Narrative   Exercise - assorted (walking, running, cycling, and cardio)     Constitutional: Denies fever, malaise, fatigue, headache or abrupt weight changes.  Respiratory: Denies difficulty breathing, shortness of breath, cough or sputum production.   Cardiovascular: Denies chest pain, chest tightness, palpitations or swelling in the hands or feet.   Psych: Pt has a history of anxiety and depression. Denies SI/HI.  No  other specific complaints in a complete review of systems (except as listed in HPI above).  Objective:   Physical Exam   BP 116/74   Pulse (!) 114   Temp 97.9 F (36.6 C) (Oral)   Wt 156 lb (70.8 kg)   SpO2 99%   BMI 19.50 kg/m  Wt Readings from Last 3 Encounters:  09/08/17 156 lb (70.8 kg)  02/07/17 160 lb (72.6 kg)  11/10/16 168 lb (76.2 kg)    General: Appears his stated age, well developed, well nourished in NAD. Cardiovascular: Normal rate and rhythm. S1,S2 noted.  No murmur, rubs or gallops noted. No JVD or BLE edema. No carotid bruits noted. Pulmonary/Chest: Normal effort and positive vesicular breath sounds. No respiratory distress. No wheezes, rales or ronchi noted.  Psychiatric: Mood and affect normal. Behavior is normal. Judgment and thought content normal.     BMET    Component Value Date/Time   NA 138 02/07/2017 1529   K 3.8 02/07/2017 1529   CL 103 02/07/2017 1529   CO2 29 02/07/2017 1529   GLUCOSE 108 (H) 02/07/2017 1529   BUN 14 02/07/2017 1529   CREATININE 0.87 02/07/2017 1529   CREATININE 0.88 01/16/2015 1531   CALCIUM 9.8 02/07/2017 1529   GFRNONAA >60 02/07/2017 1529   GFRAA >60 02/07/2017 1529    Lipid Panel     Component Value Date/Time   CHOL 109 09/18/2016 0817   TRIG 95 09/18/2016 0817   HDL 43 09/18/2016 0817   CHOLHDL 2.5 09/18/2016 0817   VLDL 19 09/18/2016 0817   LDLCALC 47 09/18/2016 0817    CBC    Component Value Date/Time   WBC 5.7 02/07/2017 1529   RBC 5.06 02/07/2017 1529   HGB 15.2 02/07/2017 1529   HCT 44.8 02/07/2017 1529   PLT 229 02/07/2017 1529   MCV 88.6 02/07/2017 1529   MCH 30.0 02/07/2017 1529   MCHC 33.9 02/07/2017 1529   RDW 14.0 02/07/2017 1529   LYMPHSABS 1.3 01/16/2015 1531   MONOABS 0.4 01/16/2015 1531   EOSABS 0.1 01/16/2015 1531   BASOSABS 0.0 01/16/2015 1531    Hgb A1C Lab Results  Component Value Date   HGBA1C 5.3 09/18/2016           Assessment & Plan:   History of PE:  Off  anticoagulation Referral to hematology placed for coagulopathy workup  Anxiety, Depression, Bipolar:  After noticing Clonazepam was not on his current med list, [facility] No [facility] of Clonazepam. I also looked him up on [facility]- no  dispenses of Clonazepam in the last 6 months. When I asked him about this, he told me he was getting it from [facility] on [address]. I called the pharmacy and they have no record of him picking up any Clonazepam. I confronted him with this information and advised him I would not be filling his Clonazepam He should follow up with psych as previously scheduled  Return precautions discussed Nicki ReaperBAITY, Jowan Skillin, NP

## 2017-09-12 ENCOUNTER — Other Ambulatory Visit (HOSPITAL_COMMUNITY): Payer: Self-pay

## 2017-09-13 ENCOUNTER — Ambulatory Visit (HOSPITAL_COMMUNITY): Payer: Self-pay

## 2017-09-13 ENCOUNTER — Other Ambulatory Visit (HOSPITAL_COMMUNITY): Payer: Self-pay

## 2017-09-14 ENCOUNTER — Other Ambulatory Visit (HOSPITAL_COMMUNITY): Payer: Self-pay

## 2017-09-15 ENCOUNTER — Other Ambulatory Visit (HOSPITAL_COMMUNITY): Payer: Self-pay

## 2017-09-15 ENCOUNTER — Ambulatory Visit (HOSPITAL_COMMUNITY): Payer: Self-pay

## 2017-09-16 ENCOUNTER — Other Ambulatory Visit (HOSPITAL_COMMUNITY): Payer: Self-pay

## 2017-10-14 ENCOUNTER — Encounter: Payer: Self-pay | Admitting: Hematology and Oncology

## 2017-10-14 ENCOUNTER — Encounter (HOSPITAL_COMMUNITY): Payer: Self-pay | Admitting: Psychiatry

## 2017-10-17 ENCOUNTER — Ambulatory Visit (HOSPITAL_COMMUNITY): Payer: Self-pay | Admitting: Psychiatry

## 2017-10-19 ENCOUNTER — Telehealth (HOSPITAL_COMMUNITY): Payer: Self-pay | Admitting: Psychiatry

## 2017-10-19 NOTE — Telephone Encounter (Signed)
Patient requested refill on zaleplon, but did not keep scheduled appointment.  Reviewing the [facility] [place] substance database, it appears that the patient has began seeing a different psychiatrist locally, Dr. Milagros Evenerupinder Kaur, in [place], [place].  It appears that he has been seeing this provider since September 06, 2017.  We will cancel any scheduled appointments and send discharge letter from the clinic, as he has begun his psychiatric care elsewhere.  Patient is not appropriate to return to this office given his treatment interfering behaviors, and concerns about drug-seeking.

## 2017-10-19 NOTE — Telephone Encounter (Signed)
10/19/17 4:37PM Discharge letter sent certified mail [phone].Marland Kitchen.Marguerite Olea/sh

## 2017-12-12 ENCOUNTER — Telehealth (HOSPITAL_COMMUNITY): Payer: Self-pay | Admitting: Psychiatry

## 2017-12-12 NOTE — Telephone Encounter (Signed)
12/12/17 5:15pm The certified mail was returned unable to forward.  #[phone].Marland Kitchen.Marguerite Olea/sh

## 2017-12-19 ENCOUNTER — Ambulatory Visit (HOSPITAL_COMMUNITY): Payer: BLUE CROSS/BLUE SHIELD | Admitting: Psychiatry

## 2019-05-30 ENCOUNTER — Ambulatory Visit (HOSPITAL_COMMUNITY)
Admission: EM | Admit: 2019-05-30 | Discharge: 2019-05-30 | Disposition: A | Discharge status: Home / Self Care | Payer: Self-pay | Attending: Psychiatry | Admitting: Psychiatry

## 2019-05-30 ENCOUNTER — Encounter (HOSPITAL_COMMUNITY): Payer: Self-pay | Admitting: Behavioral Health

## 2019-05-30 ENCOUNTER — Emergency Department (HOSPITAL_COMMUNITY)
Admission: EM | Admit: 2019-05-30 | Discharge: 2019-05-31 | Disposition: A | Discharge status: Other Acute Inpatient Hospital | Payer: Self-pay | Attending: Emergency Medicine | Admitting: Emergency Medicine

## 2019-05-30 ENCOUNTER — Encounter (HOSPITAL_COMMUNITY): Payer: Self-pay | Admitting: Emergency Medicine

## 2019-05-30 DIAGNOSIS — Z008 Encounter for other general examination: Secondary | ICD-10-CM

## 2019-05-30 DIAGNOSIS — Z8043 Family history of malignant neoplasm of testis: Secondary | ICD-10-CM | POA: Insufficient documentation

## 2019-05-30 DIAGNOSIS — F319 Bipolar disorder, unspecified: Secondary | ICD-10-CM | POA: Insufficient documentation

## 2019-05-30 DIAGNOSIS — Z818 Family history of other mental and behavioral disorders: Secondary | ICD-10-CM | POA: Insufficient documentation

## 2019-05-30 DIAGNOSIS — F312 Bipolar disorder, current episode manic severe with psychotic features: Secondary | ICD-10-CM | POA: Diagnosis present

## 2019-05-30 DIAGNOSIS — J45909 Unspecified asthma, uncomplicated: Secondary | ICD-10-CM | POA: Insufficient documentation

## 2019-05-30 DIAGNOSIS — F419 Anxiety disorder, unspecified: Secondary | ICD-10-CM | POA: Insufficient documentation

## 2019-05-30 DIAGNOSIS — F29 Unspecified psychosis not due to a substance or known physiological condition: Secondary | ICD-10-CM | POA: Insufficient documentation

## 2019-05-30 DIAGNOSIS — Z86718 Personal history of other venous thrombosis and embolism: Secondary | ICD-10-CM | POA: Insufficient documentation

## 2019-05-30 DIAGNOSIS — Z7289 Other problems related to lifestyle: Secondary | ICD-10-CM | POA: Insufficient documentation

## 2019-05-30 DIAGNOSIS — F909 Attention-deficit hyperactivity disorder, unspecified type: Secondary | ICD-10-CM | POA: Insufficient documentation

## 2019-05-30 DIAGNOSIS — Z811 Family history of alcohol abuse and dependence: Secondary | ICD-10-CM | POA: Insufficient documentation

## 2019-05-30 DIAGNOSIS — Z20828 Contact with and (suspected) exposure to other viral communicable diseases: Secondary | ICD-10-CM | POA: Insufficient documentation

## 2019-05-30 DIAGNOSIS — F1990 Other psychoactive substance use, unspecified, uncomplicated: Secondary | ICD-10-CM | POA: Insufficient documentation

## 2019-05-30 DIAGNOSIS — M549 Dorsalgia, unspecified: Secondary | ICD-10-CM | POA: Insufficient documentation

## 2019-05-30 DIAGNOSIS — Z86711 Personal history of pulmonary embolism: Secondary | ICD-10-CM | POA: Insufficient documentation

## 2019-05-30 DIAGNOSIS — G8929 Other chronic pain: Secondary | ICD-10-CM | POA: Insufficient documentation

## 2019-05-30 LAB — CBC
HCT: 47.4 % (ref 39.0–52.0)
Hemoglobin: 15.9 g/dL (ref 13.0–17.0)
MCH: 30.1 pg (ref 26.0–34.0)
MCHC: 33.5 g/dL (ref 30.0–36.0)
MCV: 89.6 fL (ref 80.0–100.0)
Platelets: 299 10*3/uL (ref 150–400)
RBC: 5.29 MIL/uL (ref 4.22–5.81)
RDW: 12.6 % (ref 11.5–15.5)
WBC: 5.3 10*3/uL (ref 4.0–10.5)
nRBC: 0 % (ref 0.0–0.2)

## 2019-05-30 LAB — RAPID URINE DRUG SCREEN, HOSP PERFORMED
Amphetamines: POSITIVE — AB
Barbiturates: NOT DETECTED
Benzodiazepines: NOT DETECTED
Cocaine: NOT DETECTED
Opiates: NOT DETECTED
Tetrahydrocannabinol: POSITIVE — AB

## 2019-05-30 LAB — COMPREHENSIVE METABOLIC PANEL
ALT: 15 U/L (ref 0–44)
AST: 18 U/L (ref 15–41)
Albumin: 4.5 g/dL (ref 3.5–5.0)
Alkaline Phosphatase: 48 U/L (ref 38–126)
Anion gap: 7 (ref 5–15)
BUN: 14 mg/dL (ref 6–20)
CO2: 27 mmol/L (ref 22–32)
Calcium: 9.4 mg/dL (ref 8.9–10.3)
Chloride: 105 mmol/L (ref 98–111)
Creatinine, Ser: 0.86 mg/dL (ref 0.61–1.24)
GFR calc Af Amer: >60 mL/min (ref >60)
GFR calc non Af Amer: >60 mL/min (ref >60)
Glucose, Bld: 101 mg/dL — ABNORMAL HIGH (ref 70–99)
Potassium: 4.4 mmol/L (ref 3.5–5.1)
Sodium: 139 mmol/L (ref 135–145)
Total Bilirubin: 1 mg/dL (ref 0.3–1.2)
Total Protein: 7.5 g/dL (ref 6.5–8.1)

## 2019-05-30 LAB — ETHANOL: Alcohol, Ethyl (B): <10 mg/dL (ref <10)

## 2019-05-30 NOTE — H&P (Addendum)
Behavioral Health Medical Screening Exam  Isaiah Garza is an 28 y.o. male with history of bipolar I disorder. He was brought to [facility] via [facility] after his mother called 47. Patient is a poor historian but reports that he and his mother had been fighting. Mother's phone was called with no response. Patient is delusional on assessment and states that he was brought to the hospital by angels. He states, "Things are powerful and I don't want them to fall into the wrong hands."   He reports prior hospitalization in January 2020 for "seeing and doing impossible things." He states he is seen by an NP at the [facility] and prescribed Vraylar and Adderall. PDMP review also shows prescription for Xanax 1 mg tabs last filled 05/05/19. Med compliance unclear.  Total Time spent with patient: 15 minutes  Psychiatric Specialty Exam: Physical Exam  Nursing note and vitals reviewed. Constitutional: He is oriented to person, place, and time. He appears well-developed and well-nourished.  Cardiovascular: Normal rate.  Respiratory: Effort normal.  Neurological: He is alert and oriented to person, place, and time.    Review of Systems  Constitutional: Negative.   Respiratory: Negative for cough and shortness of breath.   Cardiovascular: Negative for chest pain.  Psychiatric/Behavioral: Negative for depression, hallucinations and suicidal ideas. The patient is not nervous/anxious and does not have insomnia.     Blood pressure (!) 146/88, pulse 98, temperature 98.2 F (36.8 C), temperature source Oral, resp. rate 16, SpO2 100 %.There is no height or weight on file to calculate BMI.  General Appearance: Casual  Eye Contact:  Good  Speech:  Normal Rate  Volume:  Normal  Mood:  Euthymic  Affect:  Constricted  Thought Process:  Disorganized  Orientation:  Full (Time, Place, and Person)  Thought Content:  Delusions  Suicidal Thoughts:  No  Homicidal Thoughts:  No  Memory:  Immediate;   Fair Recent;    Fair Remote;   Fair  Judgement:  Impaired  Insight:  Lacking  Psychomotor Activity:  Normal  Concentration: Concentration: Fair and Attention Span: Fair  Recall:  [place]: Fair  Akathisia:  No  Handed:  Right  AIMS (if indicated):     Assets:  Communication Skills Housing Resilience Social Support  Sleep:       Musculoskeletal: Strength & Muscle Tone: within normal limits Gait & Station: normal Patient leans: N/A  Blood pressure (!) 146/88, pulse 98, temperature 98.2 F (36.8 C), temperature source Oral, resp. rate 16, SpO2 100 %.  Recommendations:  Based on my evaluation the patient does not appear to have an emergency medical condition.  Connye Burkitt, NP 05/30/2019, 4:16 PM

## 2019-05-30 NOTE — BH Assessment (Signed)
Assessment Note  Isaiah NeighborsCharles A Garza is a 28 y.o. male who [facility] to [facility] on a voluntary basis (transported by police, per report). Per report, police were summoned after he had an altercation with his mother, Marcell AngerMarty Aycock.  Pt was last assessed by TTS in 2017 for mania.  [place] lives in [place] with his mother, and he is employed.  Pt receives outpatient psychiatric and therapy services through [facility] in [place].  Per report, he has a history of Bipolar I Disorder and ADHD.  Pt was very pleasant but guarded during assessment.  When asked why he presented to the ED, Pt stated, ''I don't know.  You should tell me... Let's just see where this goes.''  Pt denied suicidal ideation, homicidal ideation, hallucination, and self-injurious behavior.  Author asked Pt what kind of help he wanted -- ''Well, we can dig into it... Things are powerful... too powerful... It could fall into the wrong hands, and then people could be hurt or killed.''  What sort of things?  Client pointed up, gesturing to the sky.  Author denied having the sense that he was being followed or that people were trying to harm him.  When asked if he felt safe in public, Pt stated, ''Oh yes, I'm very safe.  Even if I were killed, I would be safe.'' When asked to explain this statement, Pt laughed.  Pt told attending NP that angels transported him to the hospital.  Pt reported that he has not slept well for the last few days.   During assessment, Pt presented as alert and oriented to time, place, and name.  Pt was guarded about presenting concern -- he only endorsed body pain on his intake sheet, and when asked what sort of help could be offered, he said, ''Really, that's for you to tell me.''  Pt's was dressed in street clothes, and he appeared appropriately groomed.  He had red nail polish on fingers.  Pt's mood was pleasant and preoccupied.  Affect was mood-congruent.  Demeanor was guarded and coy.  Pt's speech was circumstantial.   Thought processes were within normal range, and thought content was circumstantial.  Pt denied hallucination or delusion, but it is apparent that he is experiencing some delusion.  Pt's memory and concentration were fair.  Insight was poor.  Judgment and impulse control were fair.  Author attempted to reach Pt's mother [name] at [phone].  The call went to voicemail, and the mailbox was not set up, making messages impossible.    Consulted with S. Rankin, NP, who also spoke with Pt.  She recommended inpatient placement for treatment of manic episode/delusion.  Diagnosis: Bipolar I Disorder, Manic; ADHD  Past Medical History:  Past Medical History:  Diagnosis Date  . ADD (attention deficit disorder)   . ADHD (attention deficit hyperactivity disorder)   . Allergy   . Anxiety   . Arthritis    neck  . Asthma   . Bipolar 1 disorder (HCC)   . Depression   . Pulmonary embolism (HCC) 10/2016    Past Surgical History:  Procedure Laterality Date  . NO PAST SURGERIES      Family History:  Family History  Problem Relation Age of Onset  . Cancer Father        testicular  . ADD / ADHD Father   . Depression Father   . Alcohol abuse Mother   . Depression Mother   . Anxiety disorder Sister   . Bipolar disorder Maternal Grandmother   .  Dementia Maternal Grandmother   . Bipolar disorder Maternal Aunt   . Bipolar disorder Cousin   . Diabetes Neg Hx   . Heart disease Neg Hx   . Early death Neg Hx   . Stroke Neg Hx     Social History:  reports that he has never smoked. He has never used smokeless tobacco. He reports current alcohol use. He reports that he does not use drugs.  Additional Social History:  Alcohol / Drug Use Pain Medications: See MAR Prescriptions: See MAR Over the Counter: See MAR History of alcohol / drug use?: Yes Substance #1 Name of Substance 1: Marijuana Substance #2 Name of Substance 2: Alcohol  CIWA: CIWA-Ar BP: (!) 146/88 Pulse Rate: 98 COWS:     Allergies: No Known Allergies  Home Medications: (Not in a hospital admission)   OB/GYN Status:  No LMP for male patient.  General Assessment Data Location of Assessment: [facility] Assessment Services TTS Assessment: In system Is this a Tele or Face-to-Face Assessment?: Face-to-Face Is this an Initial Assessment or a Re-assessment for this encounter?: Initial Assessment Patient Accompanied by:: N/A Language Other than English: No Living Arrangements: Other (Comment) What gender do you identify as?: Male Marital status: Single Pregnancy Status: No Living Arrangements: Parent(Reported that he lives with mother  ''Stars'' Wadel) Can pt return to current living arrangement?: Yes Admission Status: Voluntary Is patient capable of signing voluntary admission?: Yes Referral Source: Self/Family/Friend Insurance type: None indicated     Crisis Care Plan Living Arrangements: Parent(Reported that he lives with mother  ''Stars'' Brosseau) Name of Psychiatrist: [facility] Name of Therapist: [facility]  Education Status Is patient currently in school?: No Is the patient employed, 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 : No Suicidal Intent: No Has patient had any suicidal intent within the past 6 months prior to admission? : No Is patient at risk for suicide?: No Suicidal Plan?: No Has patient had any suicidal plan within the past 6 months prior to admission? : No Access to Means: No What has been your use of drugs/alcohol within the last 12 months?: Episodic use of marijuana, per report Previous Attempts/Gestures: No Intentional Self Injurious Behavior: None Family Suicide History: Unknown Recent stressful life event(s): Other (Comment)(Pt could not say) Persecutory voices/beliefs?: No Depression: No Depression  Symptoms: Insomnia(Recent insomnia) Substance abuse history and/or treatment for substance abuse?: Yes Suicide prevention information given to non-admitted patients: Not applicable  Risk to Others within the past 6 months Homicidal Ideation: No Does patient have any lifetime risk of violence toward others beyond the six months prior to admission? : No Thoughts of Harm to Others: No Current Homicidal Intent: No Current Homicidal Plan: No Access to Homicidal Means: No History of harm to others?: No Assessment of Violence: None Noted Criminal Charges Pending?: No Does patient have a court date: No Is patient on probation?: No  Psychosis Hallucinations: None noted(Pt denied) Delusions: Unspecified(See notes)  Mental Status Report Appearance/Hygiene: Unremarkable, Other (Comment)(Street clothes) Eye Contact: Good Motor Activity: Freedom of movement, Mannerisms, Unremarkable Speech: Unremarkable, Other (Comment)(Guarded) Level of Consciousness: Alert Mood: Pleasant, Preoccupied Affect: Preoccupied(Pleasantly evasive) Anxiety Level: None Thought Processes: Circumstantial Judgement: Partial Orientation: Person, Place, Time Obsessive Compulsive Thoughts/Behaviors: None  Cognitive Functioning Concentration: Good Memory: Unable to Assess Is patient IDD: No Insight: Poor Impulse Control: Fair Sleep: Unable to Assess  ADLScreening [facility] Assessment Services) Patient's cognitive ability adequate to safely complete daily activities?: Yes Patient able to express need for assistance with ADLs?: Yes Independently performs ADLs?: Yes (appropriate for developmental age)  Prior Inpatient Therapy Prior Inpatient Therapy: Yes Prior Therapy Dates: 2017 Prior Therapy Facilty/Provider(s): [facility] Reason for Treatment: Bipolar  Prior Outpatient Therapy Prior Outpatient Therapy: Yes Prior Therapy Dates: Ongoing Prior Therapy Facilty/Provider(s): [facility] Reason for Treatment:  Bipolar Disorder Does patient have an ACCT team?: No Does patient have Intensive In-House Services?  : No Does patient have [facility] services? : No Does patient have P4CC services?: No  ADL Screening (condition at time of admission) Patient's cognitive ability adequate to safely complete daily activities?: Yes Is the patient deaf or have difficulty hearing?: No Does the patient have difficulty seeing, even when wearing glasses/contacts?: No Does the patient have difficulty concentrating, remembering, or making decisions?: No Patient able to express need for assistance with ADLs?: Yes Does the patient have difficulty dressing or bathing?: No Independently performs ADLs?: Yes (appropriate for developmental age) Does the patient have difficulty walking or climbing stairs?: No Weakness of Legs: None Weakness of Arms/Hands: None  Home Assistive Devices/Equipment Home Assistive Devices/Equipment: None  Therapy Consults (therapy consults require a physician order) PT Evaluation Needed: No OT Evalulation Needed: No SLP Evaluation Needed: No Abuse/Neglect Assessment (Assessment to be complete while patient is alone) Abuse/Neglect Assessment Can Be Completed: Yes Physical Abuse: Denies Verbal Abuse: Denies Sexual Abuse: Denies Exploitation of patient/patient's resources: Denies Self-Neglect: Denies Values / Beliefs Cultural Requests During Hospitalization: None Spiritual Requests During Hospitalization: None Consults Spiritual Care Consult Needed: No Social Work Consult Needed: No Merchant navy officer (For Healthcare) Does Patient Have a Medical Advance Directive?: No          Disposition:  Disposition Initial Assessment Completed for this Encounter: Yes Disposition of Patient: Admit Type of inpatient treatment program: (Per S. Rankin, NP, Pt meets inpt criteria)  On Site Evaluation by:   Reviewed with Physician:    Dorris Fetch Ripley Bogosian 05/30/2019 4:25 PM

## 2019-05-30 NOTE — ED Triage Notes (Signed)
Patient is a poor historian , can not answer direct questions from triage nurse , pt. speaking to himself and unable to focus at arrival .

## 2019-05-31 ENCOUNTER — Inpatient Hospital Stay (HOSPITAL_COMMUNITY)
Admission: AD | Admit: 2019-05-31 | Discharge: 2019-06-06 | DRG: 885 | Disposition: A | Discharge status: Home / Self Care | Payer: Federal, State, Local not specified - Other | Source: Intra-hospital | Attending: Psychiatry | Admitting: Psychiatry

## 2019-05-31 ENCOUNTER — Other Ambulatory Visit: Payer: Self-pay

## 2019-05-31 ENCOUNTER — Encounter (HOSPITAL_COMMUNITY): Payer: Self-pay

## 2019-05-31 DIAGNOSIS — G4700 Insomnia, unspecified: Secondary | ICD-10-CM | POA: Diagnosis present

## 2019-05-31 DIAGNOSIS — Z20828 Contact with and (suspected) exposure to other viral communicable diseases: Secondary | ICD-10-CM | POA: Diagnosis present

## 2019-05-31 DIAGNOSIS — F419 Anxiety disorder, unspecified: Secondary | ICD-10-CM | POA: Diagnosis present

## 2019-05-31 DIAGNOSIS — F312 Bipolar disorder, current episode manic severe with psychotic features: Secondary | ICD-10-CM | POA: Diagnosis present

## 2019-05-31 DIAGNOSIS — I10 Essential (primary) hypertension: Secondary | ICD-10-CM | POA: Diagnosis present

## 2019-05-31 DIAGNOSIS — F1220 Cannabis dependence, uncomplicated: Secondary | ICD-10-CM | POA: Diagnosis present

## 2019-05-31 LAB — SARS CORONAVIRUS 2 BY RT PCR (HOSPITAL ORDER, PERFORMED IN ~~LOC~~ HOSPITAL LAB): SARS Coronavirus 2: NEGATIVE

## 2019-05-31 MED ORDER — ALUM & MAG HYDROXIDE-SIMETH 200-200-20 MG/5ML PO SUSP
30.0000 mL | ORAL | Status: DC | PRN
  Administered 2019-06-03 – 2019-06-05 (×3): 30 mL via ORAL
  Filled 2019-05-31 (×3): qty 30

## 2019-05-31 MED ORDER — HYDROXYZINE HCL 50 MG PO TABS
50.0000 mg | ORAL_TABLET | Freq: Three times a day (TID) | ORAL | Status: DC | PRN
  Administered 2019-06-01 (×2): 50 mg via ORAL
  Filled 2019-05-31 (×2): qty 1

## 2019-05-31 MED ORDER — ACETAMINOPHEN 325 MG PO TABS: 650.0000 mg | ORAL_TABLET | ORAL | Status: DC | PRN

## 2019-05-31 MED ORDER — OMEGA-3-ACID ETHYL ESTERS 1 G PO CAPS
1.0000 g | ORAL_CAPSULE | Freq: Two times a day (BID) | ORAL | Status: DC
  Administered 2019-05-31 – 2019-06-06 (×12): 1 g via ORAL
  Filled 2019-05-31 (×15): qty 1

## 2019-05-31 MED ORDER — BENZTROPINE MESYLATE 0.5 MG PO TABS
0.5000 mg | ORAL_TABLET | Freq: Two times a day (BID) | ORAL | Status: DC
  Administered 2019-05-31 – 2019-06-06 (×12): 0.5 mg via ORAL
  Filled 2019-05-31 (×15): qty 1

## 2019-05-31 MED ORDER — CARIPRAZINE HCL 1.5 MG PO CAPS
3.0000 mg | ORAL_CAPSULE | Freq: Every day | ORAL | Status: DC
  Administered 2019-05-31: 3 mg via ORAL
  Filled 2019-05-31 (×2): qty 2

## 2019-05-31 MED ORDER — TEMAZEPAM 30 MG PO CAPS
30.0000 mg | ORAL_CAPSULE | Freq: Every day | ORAL | Status: DC
  Administered 2019-06-01 – 2019-06-04 (×5): 30 mg via ORAL
  Filled 2019-05-31 (×6): qty 1

## 2019-05-31 MED ORDER — ACETAMINOPHEN 325 MG PO TABS
650.0000 mg | ORAL_TABLET | Freq: Four times a day (QID) | ORAL | Status: DC | PRN
  Administered 2019-05-31 – 2019-06-06 (×12): 650 mg via ORAL
  Filled 2019-05-31 (×11): qty 2

## 2019-05-31 MED ORDER — CLONIDINE HCL 0.1 MG PO TABS
0.1000 mg | ORAL_TABLET | Freq: Three times a day (TID) | ORAL | Status: DC | PRN
  Filled 2019-05-31: qty 1

## 2019-05-31 MED ORDER — LAMOTRIGINE 25 MG PO TABS
25.0000 mg | ORAL_TABLET | Freq: Two times a day (BID) | ORAL | Status: DC
  Administered 2019-05-31 – 2019-06-04 (×8): 25 mg via ORAL
  Filled 2019-05-31 (×11): qty 1

## 2019-05-31 MED ORDER — MAGNESIUM HYDROXIDE 400 MG/5ML PO SUSP: 30.0000 mL | Freq: Every day | ORAL | Status: DC | PRN

## 2019-05-31 MED ORDER — RISPERIDONE 3 MG PO TABS
3.0000 mg | ORAL_TABLET | Freq: Two times a day (BID) | ORAL | Status: DC
  Administered 2019-05-31 – 2019-06-03 (×7): 3 mg via ORAL
  Filled 2019-05-31 (×11): qty 1

## 2019-05-31 NOTE — ED Notes (Signed)
Patient called and spoke with mother and told her where he was going and that he felt better.

## 2019-05-31 NOTE — ED Notes (Signed)
breakfast tray at bedside 

## 2019-05-31 NOTE — BHH Suicide Risk Assessment (Signed)
[facility] Admission Suicide Risk Assessment   Nursing information obtained from:  Patient Demographic factors:  Male Current Mental Status:  NA Loss Factors:  NA Historical Factors:  NA Risk Reduction Factors:  NA  Total Time spent with patient: 45 minutes Principal Problem: <principal problem not specified> Diagnosis:  Active Problems:   Bipolar I disorder, current or most recent episode manic, with psychotic features (HCC)  Subjective Data: BPAD  Continued Clinical Symptoms:  Alcohol Use Disorder Identification Test Final Score (AUDIT): 0 The "Alcohol Use Disorders Identification Test", Guidelines for Use in Primary Care, Second Edition.  World Pharmacologist [facility]). Score between 0-7:  no or low risk or alcohol related problems. Score between 8-15:  moderate risk of alcohol related problems. Score between 16-19:  high risk of alcohol related problems. Score 20 or above:  warrants further diagnostic evaluation for alcohol dependence and treatment.   CLINICAL FACTORS:   Alcohol/Substance Abuse/Dependencies  Musculoskeletal: Strength & Muscle Tone: within normal limits Gait & Station: normal Patient leans: N/A  Psychiatric Specialty Exam: Physical Exam  Nursing note and vitals reviewed. Constitutional: He appears well-developed and well-nourished.  Cardiovascular: Normal rate and regular rhythm.    Review of Systems  Constitutional: Negative.   Eyes: Negative.   Cardiovascular: Negative.   Gastrointestinal: Negative.   Musculoskeletal: Positive for myalgias.  Skin: Negative.   Neurological: Negative.   Psychiatric/Behavioral: Negative.   History of head injury  Blood pressure (!) 123/91, pulse 81, temperature 98 F (36.7 C), temperature source Oral, resp. rate 16, height 6\' 3"  (1.905 m), weight 66.7 kg, SpO2 100 %.Body mass index is 18.37 kg/m.  General Appearance: Bizarre  Eye Contact:  Fair  Speech:  Clear and Coherent  Volume:  Increased  Mood:  Euphoric   Affect:  Appropriate  Thought Process:  Irrelevant and Descriptions of Associations: Loose  Orientation:  Full (Time, Place, and Person)  Thought Content:  Illogical and Delusions  Suicidal Thoughts:  No  Homicidal Thoughts:  neg  Memory:  Immediate;   Fair Recent;   Fair Remote;   Fair  Judgement:  Fair  Insight:  Present  Psychomotor Activity:  Normal  Concentration:  Concentration: Fair and Attention Span: Fair  Recall:  [employer] of Knowledge:  Fair  Language:  Fair  Akathisia:  Negative  Handed:  Right  AIMS (if indicated):     Assets:  Leisure Time Physical Health Resilience Social Support  ADL's:  Intact  Cognition:  WNL  Sleep:      COGNITIVE FEATURES THAT CONTRIBUTE TO RISK:  Loss of executive function    SUICIDE RISK:   Minimal: No identifiable suicidal ideation.  Patients presenting with no risk factors but with morbid ruminations; may be classified as minimal risk based on the severity of the depressive symptoms  PLAN OF CARE: Admit for stabilization.  Further, father is hopeful that we can dissuade the patient from traveling to [place] and relocating there as he is clearly not well enough for this type of move  I certify that inpatient services furnished can reasonably be expected to improve the patient's condition.   Johnn Hai, MD 05/31/2019, 3:02 PM

## 2019-05-31 NOTE — ED Notes (Signed)
Patient ate entire breakfast acting differently he dropped his sausage on the floor he picked it up and ate it, ask him to please not do that the floor is very dirty. Also ate banana peeling. Waiting TTS

## 2019-05-31 NOTE — BHH Counselor (Signed)
Adult Comprehensive Assessment  Patient ID: Isaiah Garza, male   DOB: [birth year], 28 y.o.   MRN: [id number]  Information Source: Information source: Patient  Current Stressors:  Patient states their primary concerns and needs for treatment are:: I don't need to be here. I am here for you guys. Patient states their goals for this hospitilization and ongoing recovery are:: I need internet, seeing is believing. Educational / Learning stressors: no Employment / Job issues: Not really Family Relationships: no Surveyor, quantityinancial / Lack of resources (include bankruptcy): no Housing / Lack of housing: no Physical health (include injuries & life threatening diseases): I could die at any second due to blood clots Social relationships: All in harmony Substance abuse: no Bereavement / Loss: you could say that but I am trying to make things right  Living/Environment/Situation:  Living Arrangements: Parent Who else lives in the home?: mom How long has patient lived in current situation?: I don't feel like I will be able to go back. 3 years. What is atmosphere in current home: Comfortable  Family History:  Marital status: Single What is your sexual orientation?: heterosexual Does patient have children?: No  Childhood History:  By whom was/is the patient raised?: Both parents Additional childhood history information: Pt's parents divorced when pt was [age] Description of patient's relationship with caregiver when they were a child: Pt reports his relationship with his mother was strained due to her abuse of alcoholism.  Pt reports she would "go into rages" after drinking too much, though they were never directed at him.  Pt reports his father was "flighty" but there when he needed him. Patient's description of current relationship with people who raised him/her: harmony I love them and they love me How were you disciplined when you got in trouble as a child/adolescent?: We would talk to him when he  misbehaved. Does patient have siblings?: Yes Number of Siblings: 1 Description of patient's current relationship with siblings: 1 younger sister; Pt states his relationship with his sister is great and he talks to her often and sees her about once a month. 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?: No Are There Guns or Other Weapons in Your Home?: No  Financial Resources:   Financial resources: Income from employment, Private insurance Does patient have a representative payee or guardian?: No  Alcohol/Substance Abuse:   What has been your use of drugs/alcohol within the last 12 months?: I have tried everything trying to fix my brain but none of it worked. no daily use anymore. used to do adderoll and marijuana for pain. Alcohol/Substance Abuse  Treatment Hx: Denies past history Has alcohol/substance abuse ever caused legal problems?: No  Social Support System:   Patient's Community Support System: Good Describe Community  Support System: everything Type of faith/religion: all of them How does patient's faith help to cope with current illness?: it does  Leisure/Recreation:   Leisure and Hobbies: dance, be outside, [place], hang out with friends, watch videos online  Strengths/Needs:   What is the patient's perception of their strengths?: a little of this and that Patient states they can use these personal strengths during their treatment to contribute to their recovery: its not about what I am good at but its more why I am here. I have to complete my mission so we can all go home Patient states these barriers may affect/interfere with their treatment: no Patient states these barriers may affect their return to the community: no  Discharge Plan:   Currently receiving community mental health services: Yes (From Whom)(I can't afford a therapist but regularly seeing a NP. [facility]) Patient states concerns and preferences for aftercare planning are: The whole system failed after discharge from the hospital. Patient states they will know when they are safe and ready for discharge when: Whenever I complete my mission. Does patient have access to transportation?: Yes Patient description of barriers related to discharge medications: I can't afford anything but luckily my doctor gives me samples. Will patient be returning to same living situation after discharge?: Yes  Summary/Recommendations:   Summary and Recommendations (to be completed by the evaluator): Patient is a 28 year old male admitted with known to have a history of bipolar disorder, history of substance abuse, and history of severe depression in the context of his bipolar disorder requiring at one point ECT therapy and at another time ketamine therapy. Patient presents with grandiose behaviors, talking in circles and other times nonsensical. Patient reports   Primary stressors when asked to patient he replied,  "no one believes me at first so I  don't expect you to but I am here for you." Patient denies SI and even refused the SPE handout stating, "I don't need that". Patient reports "trying everything" when inquired about SA, he reports this was to "fix my brain". Patient will benefit from crisis stabilization, medication evaluation, group therapy and psychoeducation, in addition to case management for discharge planning. At discharge it is recommended that Patient adhere to the established discharge plan and continue in treatment.  Tye Savoy. 05/31/2019

## 2019-05-31 NOTE — Progress Notes (Signed)
This patient has been accepted to [facility].  Accepting provider: Dr.Farah  Bed: 505-1  RN Call for Report: [phone]  This patient is currently voluntary and will be able to use [place] transportation.  Stephanie Acre, LCSW-A Clinical Social Worker

## 2019-05-31 NOTE — Progress Notes (Signed)
Recreation Therapy Notes  INPATIENT RECREATION THERAPY ASSESSMENT  Patient Details Name: CHAD DONOGHUE MRN: [id number] DOB: 04/18/1991 Today's Date: 05/31/2019       Information Obtained From: Patient  Able to Participate in Assessment/Interview: Yes  Patient Presentation: Alert  Reason for Admission (Per Patient): Other (Comments)(Pt stated he was here for his family)  Patient Stressors: Other (Comment)(Pretending to be someone I wasn't)  Coping Skills:   Isolation, Journal, TV, Sports, Music, Deep Breathing, Meditate, Talk, Art, Prayer, Avoidance, Read, Hot Bath/Shower, Dance  Leisure Interests (2+):  Games - Video games, Individual - Other (Comment)(Martial arts; Thinking; Dance)  Frequency of Recreation/Participation: Other (Comment)(Daily)  Awareness of Community Resources:  Yes  Community Resources:  [employer], Other (Comment)(Basketball court; [place])  Current Use: No  If no, Barriers?: Other (Comment)(Pt stated he had been isolating himself)  Expressed Interest in Mio Schellinger: No  [place] of Residence:  [place]  Patient Main Form of Transportation: Car  Patient Strengths:  Helping others; Seeing the good/light in others; Save people from darkness  Patient Identified Areas of Improvement:  All of them  Patient Goal for Hospitalization:  "to prove there's no such thing as crazy"  Current SI (including self-harm):  No  Current HI:  No  Current AVH: No  Staff Intervention Plan: Group Attendance, Collaborate with Interdisciplinary Treatment Team  Consent to Intern Participation: N/A    Victorino Sparrow, LRT/CTRS  Victorino Sparrow A 05/31/2019, 2:54 PM

## 2019-05-31 NOTE — BH Assessment (Signed)
[facility] Assessment Progress Note   Clinician contacted nurse Dorothea Ogle about need for patient to have COVID 19 test.  He said he would take care of it.  Pt accepted to [facility] pending results.  Room assignment later in the morning.

## 2019-05-31 NOTE — ED Notes (Signed)
Patient's belongings inventoried and placed in [facility]4. Cell Phone sent to security. Patient signed Medical Clearance Policy Form and placed in Pt's psych hold folder.

## 2019-05-31 NOTE — Progress Notes (Signed)
Patient ID: Isaiah Garza, male   DOB: 11/10/1990, 28 y.o.   MRN: [id number] Patient admitted to the unit due to increased manic behaviors.  Patient was euphoric and joking during assessment.  Patient denies SI, HI and AVH and reports that he is here to show his friends that they are angels. Patient's belongings were searched and he was found to be free of all contraband.  Skin assessment was complete and patient found free of all injury.   Patient admitted to the unit without incident.

## 2019-05-31 NOTE — ED Provider Notes (Addendum)
[facility] EMERGENCY DEPARTMENT Provider Note   CSN: [id number] Arrival date & time: 05/30/19  1805     History   Chief Complaint No chief complaint on file.   HPI Isaiah Garza is a 27 y.o. male with history of bipolar 1 disorder, pulmonary embolism, ADHD, reported chronic back pain who presents for medical clearance from behavioral health walk-in clinic.  Patient was recommended for inpatient treatment.  Patient is very vague, but reports that he is keeping up from [place] to someone and that is the reason he is here.  He reports he raised his voice and argument and, whom I suspect this is mother, called the police.  Patient never referred to her as his mother.  Patient denies any new pain today.  He states he has history of chronic back pain and he has been evaluated for this in the past.  He denies any new symptoms.  When asked about patient history of pulmonary embolism, he states it was a year or 2 ago.  He is not currently anticoagulated.  He states he was supposed to follow-up for a coagulation work-up to figure out reason, however he did not have insurance and could not.  He has not had a recurrence of pain and denies any chest pain or shortness of breath today.  Patient reports he no longer takes Lamictal and only takes Dietitian, however there is Adderall listed on his behavioral health screening exam from yesterday afternoon.  There is also questionable Xanax prescription on PDMP.  When I asked the patient which medications he is taking, he states only very large and some medications as needed, but he states he does not need them.  He would not elaborate upon which are they are.     HPI  Past Medical History:  Diagnosis Date  . ADD (attention deficit disorder)   . ADHD (attention deficit hyperactivity disorder)   . Allergy   . Anxiety   . Arthritis    neck  . Asthma   . Bipolar 1 disorder ([place])   . Depression   . Pulmonary embolism ([place]) 10/2016    Patient  Active Problem List   Diagnosis Date Noted  . Bipolar 1 disorder ([place]) 05/30/2019  . Bipolar I disorder ([place]) 11/03/2016  . Cannabis use disorder, moderate, in controlled environment ([employer]) 08/08/2016    Past Surgical History:  Procedure Laterality Date  . NO PAST SURGERIES          Home Medications    Prior to Admission medications   Medication Sig Start Date End Date Taking? Authorizing Provider  lamoTRIgine (LAMICTAL) 200 MG tablet Take 1 tablet (200 mg total) by mouth daily. 03/16/17 03/16/18  Aundra Dubin, MD    Family History Family History  Problem Relation Age of Onset  . Cancer Father        testicular  . ADD / ADHD Father   . Depression Father   . Alcohol abuse Mother   . Depression Mother   . Anxiety disorder Sister   . Bipolar disorder Maternal Grandmother   . Dementia Maternal Grandmother   . Bipolar disorder Maternal Aunt   . Bipolar disorder Cousin   . Diabetes Neg Hx   . Heart disease Neg Hx   . Early death Neg Hx   . Stroke Neg Hx     Social History Social History   Tobacco Use  . Smoking status: Never Smoker  . Smokeless tobacco: Never Used  Substance Use  Topics  . Alcohol use: Yes    Alcohol/week: 0.0 standard drinks    Comment: none at present  . Drug use: No    Comment: none at present. periodic use of marijuana lasted used holidays     Allergies   Patient has no known allergies.   Review of Systems Review of Systems  Constitutional: Negative for chills and fever.  HENT: Negative for facial swelling and sore throat.   Respiratory: Negative for shortness of breath.   Cardiovascular: Negative for chest pain.  Gastrointestinal: Negative for abdominal pain, nausea and vomiting.  Genitourinary: Negative for dysuria.  Musculoskeletal: Positive for back pain (chronic).  Skin: Negative for rash and wound.  Neurological: Negative for headaches.  Psychiatric/Behavioral: The patient is not nervous/anxious.      Physical  Exam Updated Vital Signs BP 126/80 (BP Location: Left Arm)   Pulse 85   Temp 98.7 F (37.1 C) (Oral)   Resp 16   SpO2 100%   Physical Exam Vitals signs and nursing note reviewed.  Constitutional:      General: He is not in acute distress.    Appearance: He is well-developed. He is not diaphoretic.  HENT:     Head: Normocephalic and atraumatic.     Mouth/Throat:     Pharynx: No oropharyngeal exudate.  Eyes:     General: No scleral icterus.       Right eye: No discharge.        Left eye: No discharge.     Conjunctiva/sclera: Conjunctivae normal.     Pupils: Pupils are equal, round, and reactive to light.  Neck:     Musculoskeletal: Normal range of motion and neck supple.     Thyroid: No thyromegaly.  Cardiovascular:     Rate and Rhythm: Normal rate and regular rhythm.     Heart sounds: Normal heart sounds. No murmur. No friction rub. No gallop.   Pulmonary:     Effort: Pulmonary effort is normal. No respiratory distress.     Breath sounds: Normal breath sounds. No stridor. No wheezing or rales.  Abdominal:     General: Bowel sounds are normal. There is no distension.     Palpations: Abdomen is soft.     Tenderness: There is no abdominal tenderness. There is no guarding or rebound.  Lymphadenopathy:     Cervical: No cervical adenopathy.  Skin:    General: Skin is warm and dry.     Coloration: Skin is not pale.     Findings: No rash.  Neurological:     Mental Status: He is alert.     Coordination: Coordination normal.  Psychiatric:        Attention and Perception: He does not perceive auditory or visual hallucinations.        Mood and Affect: Mood normal.        Speech: Speech normal.        Thought Content: Thought content does not include homicidal or suicidal ideation.     Comments: Pleasant, answers questions appropriately; although very emphatic with word choice      ED Treatments / Results  Labs (all labs ordered are listed, but only abnormal results are  displayed) Labs Reviewed  COMPREHENSIVE METABOLIC PANEL - Abnormal; Notable for the following components:      Result Value   Glucose, Bld 101 (*)    All other components within normal limits  RAPID URINE DRUG SCREEN, HOSP PERFORMED - Abnormal; Notable for the following components:  Amphetamines POSITIVE (*)    Tetrahydrocannabinol POSITIVE (*)    All other components within normal limits  ETHANOL  CBC    EKG None  Radiology No results found.  Procedures Procedures (including critical care time)  Medications Ordered in ED Medications - No data to display   Initial Impression / Assessment and Plan / ED Course  I have reviewed the triage vital signs and the nursing notes.  Pertinent labs & imaging results that were available during my care of the patient were reviewed by me and considered in my medical decision making (see chart for details).        Patient presents for medical clearance for admission to behavioral health Hospital.  Labs are unremarkable.  Patient reports no new medical problems today.  Patient vitals are stable.  No emergent medical conditions found today.  Patient is medically cleared for admission to behavioral health. Patient is here voluntarily at this time.  Final Clinical Impressions(s) / ED Diagnoses   Final diagnoses:  Medical clearance for psychiatric admission    ED Discharge Orders    None           Emi HolesLaw, Kierstan Auer M, Cordelia Poche-C 05/31/19 [id number]    Wickline, Donald, MD 05/31/19 [phone]
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
## 2019-06-01 DIAGNOSIS — F1220 Cannabis dependence, uncomplicated: Secondary | ICD-10-CM

## 2019-06-01 NOTE — Progress Notes (Signed)
D: Patient denies SI, HI or AVH. Patient presents as anxious and animated but cooperative.  Pt. Spent the majority of the evening in his room sleeping.  Pt. C/o a headache for which he was medicated.  Pt. Denies any other needs at this time.    A: Patient given emotional support from RN. Patient encouraged to come to staff with concerns and/or questions. Patient's medication routine continued. Patient's orders and plan of care reviewed.   R: Patient remains appropriate and cooperative. Will continue to monitor patient q15 minutes for safety.

## 2019-06-01 NOTE — Progress Notes (Signed)
Adult Psychoeducational Group Note  Date:  06/01/2019 Time:  10:32 PM  Group Topic/Focus:  Wrap-Up Group:   The focus of this group is to help patients review their daily goal of treatment and discuss progress on daily workbooks.  Participation Level:  Active  Participation Quality:  Appropriate  Affect:  Appropriate  Cognitive:  Appropriate  Insight: Appropriate  Engagement in Group:  Developing/Improving  Modes of Intervention:  Discussion  Additional Comments:  Pt stated his goal for today was to focus on his treatment. Pt stated he felt he accomplished his goal today. Pt stated his relationship with his family has improved since he was admitted here. Pt stated he felt better about himself today. Pt rated his overall day an 7 out of 10. Pt stated his appetite was pretty good today. Pt stated his goal for tonight is to get another good night's rest. Pt stated he had pain in his neck, legs, back, and feet tonight. Pt nurse was made aware of the situation. Pt stated if anything change he would alert staff.  Candy Sledge 06/01/2019, 10:32 PM

## 2019-06-01 NOTE — Progress Notes (Signed)
Patient ID: Isaiah Garza, male   DOB: 08-May-1991, 28 y.o.   MRN: [id number]  [place] NOVEL CORONAVIRUS (COVID-19) DAILY CHECK-OFF SYMPTOMS - answer yes or no to each - every day NO YES  Have you had a fever in the past 24 hours?  . Fever (Temp > 37.80C / 100F) X   Have you had any of these symptoms in the past 24 hours? . New Cough .  Sore Throat  .  Shortness of Breath .  Difficulty Breathing .  Unexplained Body Aches   X   Have you had any one of these symptoms in the past 24 hours not related to allergies?   . Runny Nose .  Nasal Congestion .  Sneezing   X   If you have had runny nose, nasal congestion, sneezing in the past 24 hours, has it worsened?  X   EXPOSURES - check yes or no X   Have you traveled outside the state in the past 14 days?  X   Have you been in contact with someone with a confirmed diagnosis of COVID-19 or PUI in the past 14 days without wearing appropriate PPE?  X   Have you been living in the same home as a person with confirmed diagnosis of COVID-19 or a PUI (household contact)?    X   Have you been diagnosed with COVID-19?    X              What to do next: Answered NO to all: Answered YES to anything:   Proceed with unit schedule Follow the [facility] Inpatient Flowsheet.

## 2019-06-01 NOTE — Plan of Care (Signed)
  Problem: Coping: Goal: Ability to identify and develop effective coping behavior will improve Outcome: Progressing   Problem: Self-Concept: Goal: Level of anxiety will decrease Outcome: Progressing   Problem: Education: Goal: Verbalization of understanding the information provided will improve Outcome: Progressing

## 2019-06-01 NOTE — Progress Notes (Signed)
Spirituality group facilitated by Simone Curia, MDIv, [place].  Group Description:  Group focused on topic of hope.  Patients participated in facilitated discussion around topic, connecting with one another around experiences and definitions for hope.  Group members engaged with visual explorer photos, reflecting on what hope looks like for them today.  Group engaged in discussion around how their definitions of hope are present today in hospital.   Modalities: Psycho-social ed, Adlerian, Narrative, MI Patient Progress: Thaddeus introduced himself as Programmer, systems"  Actively engaged in group.  His reflections on hope were around love and "seeing one love."  When reflecting on what he is hopeful for today - he reported that his sister is getting married tomorrow and he knows he will be there is spirit even if he is not able to be there in person.

## 2019-06-01 NOTE — Tx Team (Signed)
Interdisciplinary Treatment and Diagnostic Plan Update  06/01/2019 Time of Session: [place] MRN: [id number]  Principal Diagnosis: <principal problem not specified>  Secondary Diagnoses: Active Problems:   Bipolar I disorder, current or most recent episode manic, with psychotic features ([place])   Current Medications:  Current Facility-Administered Medications  Medication Dose Route Frequency Provider Last Rate Last Dose  . acetaminophen (TYLENOL) tablet 650 mg  650 mg Oral Q6H PRN Johnn Hai, MD   650 mg at 06/01/19 0742  . alum & mag hydroxide-simeth (MAALOX/MYLANTA) 200-200-20 MG/5ML suspension 30 mL  30 mL Oral Q4H PRN Johnn Hai, MD      . benztropine (COGENTIN) tablet 0.5 mg  0.5 mg Oral BID Johnn Hai, MD   0.5 mg at 06/01/19 0741  . cloNIDine (CATAPRES) tablet 0.1 mg  0.1 mg Oral TID PRN Johnn Hai, MD      . hydrOXYzine (ATARAX/VISTARIL) tablet 50 mg  50 mg Oral TID PRN Johnn Hai, MD      . lamoTRIgine (LAMICTAL) tablet 25 mg  25 mg Oral BID Johnn Hai, MD   25 mg at 06/01/19 0741  . magnesium hydroxide (MILK OF MAGNESIA) suspension 30 mL  30 mL Oral Daily PRN Johnn Hai, MD      . omega-3 acid ethyl esters (LOVAZA) capsule 1 g  1 g Oral BID Johnn Hai, MD   1 g at 06/01/19 0741  . risperiDONE (RISPERDAL) tablet 3 mg  3 mg Oral BID Johnn Hai, MD   3 mg at 06/01/19 0740  . temazepam (RESTORIL) capsule 30 mg  30 mg Oral QHS Johnn Hai, MD   30 mg at 06/01/19 0133   PTA Medications: Medications Prior to Admission  Medication Sig Dispense Refill Last Dose  . ALPRAZolam (XANAX) 1 MG tablet Take 0.5-1 mg by mouth 2 (two) times daily as needed for anxiety.      Marland Kitchen amphetamine-dextroamphetamine (ADDERALL) 20 MG tablet Take 60 mg by mouth daily.      Marland Kitchen buPROPion (WELLBUTRIN XL) 150 MG 24 hr tablet Take 150 mg by mouth daily.     . cariprazine (VRAYLAR) capsule Take 3 mg by mouth every other day.     . Glucosamine-Chondroit-Vit C-Mn (GLUCOSAMINE 1500  COMPLEX PO) Take 1 tablet by mouth daily.     Marland Kitchen lamoTRIgine (LAMICTAL) 200 MG tablet Take 1 tablet (200 mg total) by mouth daily. (Patient not taking: Reported on 05/31/2019) 90 tablet 1   . LORazepam (ATIVAN) 2 MG tablet Take 2 mg by mouth every 6 (six) hours as needed for sleep.     Marland Kitchen omega-3 acid ethyl esters (LOVAZA) 1 g capsule Take 1 g by mouth daily.       Patient Stressors:    Patient Strengths:    Treatment Modalities: Medication Management, Group therapy, Case management,  1 to 1 session with clinician, Psychoeducation, Recreational therapy.   Physician Treatment Plan for Primary Diagnosis: <principal problem not specified> Long Term Goal(s): Improvement in symptoms so as ready for discharge Improvement in symptoms so as ready for discharge   Short Term Goals: Ability to identify changes in lifestyle to reduce recurrence of condition will improve Ability to verbalize feelings will improve Ability to disclose and discuss suicidal ideas Ability to demonstrate self-control will improve Ability to verbalize feelings will improve Ability to disclose and discuss suicidal ideas Ability to demonstrate self-control will improve  Medication Management: Evaluate patient's response, side effects, and tolerance of medication regimen.  Therapeutic Interventions: 1 to 1  sessions, Unit Group sessions and Medication administration.  Evaluation of Outcomes: Not Met  Physician Treatment Plan for Secondary Diagnosis: Active Problems:   Bipolar I disorder, current or most recent episode manic, with psychotic features ([place])  Long Term Goal(s): Improvement in symptoms so as ready for discharge Improvement in symptoms so as ready for discharge   Short Term Goals: Ability to identify changes in lifestyle to reduce recurrence of condition will improve Ability to verbalize feelings will improve Ability to disclose and discuss suicidal ideas Ability to demonstrate self-control will  improve Ability to verbalize feelings will improve Ability to disclose and discuss suicidal ideas Ability to demonstrate self-control will improve     Medication Management: Evaluate patient's response, side effects, and tolerance of medication regimen.  Therapeutic Interventions: 1 to 1 sessions, Unit Group sessions and Medication administration.  Evaluation of Outcomes: Not Met   RN Treatment Plan for Primary Diagnosis: <principal problem not specified> Long Term Goal(s): Knowledge of disease and therapeutic regimen to maintain health will improve  Short Term Goals: Ability to identify and develop effective coping behaviors will improve and Compliance with prescribed medications will improve  Medication Management: RN will administer medications as ordered by provider, will assess and evaluate patient's response and provide education to patient for prescribed medication. RN will report any adverse and/or side effects to prescribing provider.  Therapeutic Interventions: 1 on 1 counseling sessions, Psychoeducation, Medication administration, Evaluate responses to treatment, Monitor vital signs and CBGs as ordered, Perform/monitor CIWA, COWS, AIMS and Fall Risk screenings as ordered, Perform wound care treatments as ordered.  Evaluation of Outcomes: Not Met   LCSW Treatment Plan for Primary Diagnosis: <principal problem not specified> Long Term Goal(s): Safe transition to appropriate next level of care at discharge, Engage patient in therapeutic group addressing interpersonal concerns.  Short Term Goals: Engage patient in aftercare planning with referrals and resources, Increase social support and Increase skills for wellness and recovery  Therapeutic Interventions: Assess for all discharge needs, 1 to 1 time with Social worker, Explore available resources and support systems, Assess for adequacy in community support network, Educate family and significant other(s) on suicide prevention,  Complete Psychosocial Assessment, Interpersonal group therapy.  Evaluation of Outcomes: Not Met   Progress in Treatment: Attending groups: No. Participating in groups: No. Taking medication as prescribed: Yes. Toleration medication: Yes. Family/Significant other contact made: No, will contact:  when given permission Patient understands diagnosis: No. Discussing patient identified problems/goals with staff: Yes. Medical problems stabilized or resolved: Yes. Denies suicidal/homicidal ideation: Yes. Issues/concerns per patient self-inventory: No. Other: none  New problem(s) identified: No, Describe:  none  New Short Term/Long Term Goal(s):  Patient Goals:    Discharge Plan or Barriers:   Reason for Continuation of Hospitalization: Delusions  Medication stabilization  Estimated Length of Stay: 3-5 days  Attendees: Patient: 06/01/2019   Physician: Dr. Mallie Darting, MD 06/01/2019   Nursing: Elesa Massed, RN 06/01/2019   RN Care Manager: 06/01/2019   Social Worker: Lurline Idol, LCSW 06/01/2019   Recreational Therapist:  06/01/2019   Other:  06/01/2019   Other:  06/01/2019   Other: 06/01/2019        Scribe for Treatment Team: Joanne Chars, [place] 06/01/2019 10:08 AM

## 2019-06-01 NOTE — Progress Notes (Signed)
[facility]  06/01/2019 1:43 PM Isaiah Garza  MRN:  [id number]  Subjective: Isaiah MostCharles reports, "I'm just here for you guys. I have already been discharged. I'm just here to help. The medicines I'm taking here are making me feel like a zombie".  Objective: This is the fourth psychiatric admission for Isaiah MostCharles, a 28 year old patient who is known to have a history of bipolar disorder, history of substance abuse, and history of severe depression in the context of his bipolar disorder requiring at one point ECT therapy and at another time ketamine therapy. He came to our attention on 9/9 after his mother had phoned 911, he had had an altercation with her and was noted to be delusional saying vague/grandiose things such as "things are powerful and I do not want them to fall into the wrong hands" 06-01-19; Isaiah MostCharles is seen, chart reviewed. The chart findings discussed with the treatment team. He presents alert, visible on the attending group sessions. Upon this follow-up care assessment, patient presents delusional. See the subjective reports above. He says he is just here for "you guys" as he has already been discharged. His thought process seem very disorganized. He is taking & tolerating his treatment regimen. There are no behavioral issues reported by staff. He currently denies any SIHI, AVH, delusional thoughts or paranoia. Isaiah MostCharles is in agreement to continue current plan of care as already in progress.  Principal Problem: Bipolar I disorder, current or Garza recent episode manic, with psychotic features (HCC)  Diagnosis: Principal Problem:   Bipolar I disorder, current or Garza recent episode manic, with psychotic features ([facility]) Active Problems:   Cannabis use disorder, moderate, in controlled environment (H[facility])  Total Time spent with patient: 25 minutes  Past Psychiatric History: Bipolar 1 disorder.  Past Medical History:  Past Medical History:  Diagnosis Date  . ADD (attention deficit  disorder)   . ADHD (attention deficit hyperactivity disorder)   . Allergy   . Anxiety   . Arthritis    neck  . Asthma   . Bipolar 1 disorder (HCC)   . Depression   . Pulmonary embolism (HCC) 10/2016    Past Surgical History:  Procedure Laterality Date  . NO PAST SURGERIES     Family History:  Family History  Problem Relation Age of Onset  . Cancer Father        testicular  . ADD / ADHD Father   . Depression Father   . Alcohol abuse Mother   . Depression Mother   . Anxiety disorder Sister   . Bipolar disorder Maternal Grandmother   . Dementia Maternal Grandmother   . Bipolar disorder Maternal Aunt   . Bipolar disorder Cousin   . Diabetes Neg Hx   . Heart disease Neg Hx   . Early death Neg Hx   . Stroke Neg Hx    Family Psychiatric  History: See H&P.  Social History:  Social History   Substance and Sexual Activity  Alcohol Use Not Currently  . Alcohol/week: 0.0 standard drinks   Comment: none at present     Social History   Substance and Sexual Activity  Drug Use Yes  . Types: Marijuana    Social History   Socioeconomic History  . Marital status: Single    Spouse name: Not on file  . Number of children: Not on file  . Years of education: Not on file  . Highest education level: Not on file  Occupational History  . Not on file  Social Needs  . Financial resource strain: Not on file  . Food insecurity    Worry: Not on file    Inability: Not on file  . Transportation needs    Medical: Not on file    Non-medical: Not on file  Tobacco Use  . Smoking status: Never Smoker  . Smokeless tobacco: Never Used  Substance and Sexual Activity  . Alcohol use: Not Currently    Alcohol/week: 0.0 standard drinks    Comment: none at present  . Drug use: Yes    Types: Marijuana  . Sexual activity: Not Currently    Partners: Female    Birth control/protection: Condom  Lifestyle  . Physical activity    Days per week: Not on file    Minutes per session: Not on  file  . Stress: Not on file  Relationships  . Social Musician on phone: Not on file    Gets together: Not on file    Attends religious service: Not on file    Active member of club or organization: Not on file    Attends meetings of clubs or organizations: Not on file    Relationship status: Not on file  Other Topics Concern  . Not on file  Social History Narrative   Pt lives in [place] with his mother; he stated that he works and is followed by Augusto Garbe, NP with [facility].   Additional Social History:   Sleep: Good  Appetite:  Good  Current Medications: Current Facility-Administered Medications  Medication Dose Route Frequency Provider Last Rate Last Dose  . acetaminophen (TYLENOL) tablet 650 mg  650 mg Oral Q6H PRN Malvin Johns, MD   650 mg at 06/01/19 0742  . alum & mag hydroxide-simeth (MAALOX/MYLANTA) 200-200-20 MG/5ML suspension 30 mL  30 mL Oral Q4H PRN Malvin Johns, MD      . benztropine (COGENTIN) tablet 0.5 mg  0.5 mg Oral BID Malvin Johns, MD   0.5 mg at 06/01/19 0741  . cloNIDine (CATAPRES) tablet 0.1 mg  0.1 mg Oral TID PRN Malvin Johns, MD      . hydrOXYzine (ATARAX/VISTARIL) tablet 50 mg  50 mg Oral TID PRN Malvin Johns, MD      . lamoTRIgine (LAMICTAL) tablet 25 mg  25 mg Oral BID Malvin Johns, MD   25 mg at 06/01/19 0741  . magnesium hydroxide (MILK OF MAGNESIA) suspension 30 mL  30 mL Oral Daily PRN Malvin Johns, MD      . omega-3 acid ethyl esters (LOVAZA) capsule 1 g  1 g Oral BID Malvin Johns, MD   1 g at 06/01/19 0741  . risperiDONE (RISPERDAL) tablet 3 mg  3 mg Oral BID Malvin Johns, MD   3 mg at 06/01/19 0740  . temazepam (RESTORIL) capsule 30 mg  30 mg Oral QHS Malvin Johns, MD   30 mg at 06/01/19 0133   Lab Results:  Results for orders placed or performed during the hospital encounter of 05/30/19 (from the past 48 hour(s))  Comprehensive metabolic panel     Status: Abnormal   Collection Time: 05/30/19  7:46 PM  Result Value  Ref Range   Sodium 139 135 - 145 mmol/L   Potassium 4.4 3.5 - 5.1 mmol/L   Chloride 105 98 - 111 mmol/L   CO2 27 22 - 32 mmol/L   Glucose, Bld 101 (H) 70 - 99 mg/dL   BUN 14 6 - 20 mg/dL   Creatinine, Ser 1.61  0.61 - 1.24 mg/dL   Calcium 9.4 8.9 - 16.110.3 mg/dL   Total Protein 7.5 6.5 - 8.1 g/dL   Albumin 4.5 3.5 - 5.0 g/dL   AST 18 15 - 41 U/L   ALT 15 0 - 44 U/L   Alkaline Phosphatase 48 38 - 126 U/L   Total Bilirubin 1.0 0.3 - 1.2 mg/dL   GFR calc non Af Amer >60 >60 mL/min   GFR calc Af Amer >60 >60 mL/min   Anion gap 7 5 - 15    Comment: Performed at [facility], [address] [place], [place] [address]  Ethanol     Status: None   Collection Time: 05/30/19  7:46 PM  Result Value Ref Range   Alcohol, Ethyl (B) <10 <10 mg/dL    Comment: (NOTE) Lowest detectable limit for serum alcohol is 10 mg/dL. For medical purposes only. Performed at [facility], [address] [place], [place] [address]   cbc     Status: None   Collection Time: 05/30/19  7:46 PM  Result Value Ref Range   WBC 5.3 4.0 - 10.5 K/uL   RBC 5.29 4.22 - 5.81 MIL/uL   Hemoglobin 15.9 13.0 - 17.0 g/dL   HCT 11.947.4 14.739.0 - 82.952.0 %   MCV 89.6 80.0 - 100.0 fL   MCH 30.1 26.0 - 34.0 pg   MCHC 33.5 30.0 - 36.0 g/dL   RDW 56.212.6 13.011.5 - 86.515.5 %   Platelets 299 150 - 400 K/uL   nRBC 0.0 0.0 - 0.2 %    Comment: Performed at [facility], [address] [place], [place] [address]  Rapid urine drug screen (hospital performed)     Status: Abnormal   Collection Time: 05/30/19  7:58 PM  Result Value Ref Range   Opiates NONE DETECTED NONE DETECTED   Cocaine NONE DETECTED NONE DETECTED   Benzodiazepines NONE DETECTED NONE DETECTED   Amphetamines POSITIVE (A) NONE DETECTED   Tetrahydrocannabinol POSITIVE (A) NONE DETECTED   Barbiturates NONE DETECTED NONE DETECTED    Comment: (NOTE) DRUG SCREEN FOR MEDICAL PURPOSES ONLY.  IF CONFIRMATION IS NEEDED FOR ANY PURPOSE, NOTIFY LAB WITHIN 5 DAYS. LOWEST  DETECTABLE LIMITS FOR URINE DRUG SCREEN Drug Class                     Cutoff (ng/mL) Amphetamine and metabolites    1000 Barbiturate and metabolites    200 Benzodiazepine                 200 Tricyclics and metabolites     300 Opiates and metabolites        300 Cocaine and metabolites        300 THC                            50 Performed at [facility], [address] [place], [place] [address]   SARS Coronavirus 2 [facility](Hospital order, Performed in [facility] hospital lab) Nasopharyngeal Nasopharyngeal Swab     Status: None   Collection Time: 05/31/19  4:18 AM   Specimen: Nasopharyngeal Swab  Result Value Ref Range   SARS Coronavirus 2 NEGATIVE NEGATIVE    Comment: (NOTE) If result is NEGATIVE SARS-CoV-2 target nucleic acids are NOT DETECTED. The SARS-CoV-2 RNA is generally detectable in upper and lower  respiratory specimens during the acute phase of infection. The lowest  concentration  of SARS-CoV-2 viral copies this assay can detect is 250  copies / mL. A negative result does not preclude SARS-CoV-2 infection  and should not be used as the sole basis for treatment or other  patient management decisions.  A negative result may occur with  improper specimen collection / handling, submission of specimen other  than nasopharyngeal swab, presence of viral mutation(s) within the  areas targeted by this assay, and inadequate number of viral copies  (<250 copies / mL). A negative result must be combined with clinical  observations, patient history, and epidemiological information. If result is POSITIVE SARS-CoV-2 target nucleic acids are DETECTED. The SARS-CoV-2 RNA is generally detectable in upper and lower  respiratory specimens dur ing the acute phase of infection.  Positive  results are indicative of active infection with SARS-CoV-2.  Clinical  correlation with patient history and other diagnostic information is  necessary to determine patient infection status.  Positive  results do  not rule out bacterial infection or co-infection with other viruses. If result is PRESUMPTIVE POSTIVE SARS-CoV-2 nucleic acids MAY BE PRESENT.   A presumptive positive result was obtained on the submitted specimen  and confirmed on repeat testing.  While 2019 novel coronavirus  (SARS-CoV-2) nucleic acids may be present in the submitted sample  additional confirmatory testing may be necessary for epidemiological  and / or clinical management purposes  to differentiate between  SARS-CoV-2 and other Sarbecovirus currently known to infect humans.  If clinically indicated additional testing with an alternate test  methodology [id number]) is advised. The SARS-CoV-2 RNA is generally  detectable in upper and lower respiratory sp ecimens during the acute  phase of infection. The expected result is Negative. Fact Sheet for Patients:  BoilerBrush.com.cy Fact Sheet for Healthcare Providers: https://pope.com/ This test is not yet approved or cleared by the [place] FDA and has been authorized for detection and/or diagnosis of SARS-CoV-2 by FDA under an Emergency Use Authorization (EUA).  This EUA will remain in effect (meaning this test can be used) for the duration of the COVID-19 declaration under Section 564(b)(1) of the Act, 21 U.S.C. section 360bbb-3(b)(1), unless the authorization is terminated or revoked sooner. Performed at [facility], [address], [place], [place] [address]    Blood Alcohol level:  Lab Results  Component Value Date   [facility] <10 05/30/2019   ETH <5 09/12/2016   Metabolic Disorder Labs: Lab Results  Component Value Date   HGBA1C 5.3 09/18/2016   MPG 105 09/18/2016   No results found for: PROLACTIN Lab Results  Component Value Date   CHOL 109 09/18/2016   TRIG 95 09/18/2016   HDL 43 09/18/2016   CHOLHDL 2.5 09/18/2016   VLDL 19 09/18/2016   LDLCALC 47 09/18/2016   LDLCALC 79 01/16/2015    Physical Findings: AIMS: Facial and Oral Movements Muscles of Facial Expression: None, normal Lips and Perioral Area: None, normal Jaw: None, normal Tongue: None, normal,Extremity Movements Upper (arms, wrists, hands, fingers): None, normal Lower (legs, knees, ankles, toes): None, normal, Trunk Movements Neck, shoulders, hips: None, normal, Overall Severity Severity of abnormal movements (highest score from questions above): None, normal Incapacitation due to abnormal movements: None, normal Patient's awareness of abnormal movements (rate only patient's report): No Awareness, Dental Status Current problems with teeth and/or dentures?: No Does patient usually wear dentures?: No  CIWA:    COWS:     Musculoskeletal: Strength & Muscle Tone: within normal limits Gait & Station: normal Patient leans: N/A  Psychiatric  Specialty Exam: Physical Exam  Nursing note and vitals reviewed. Constitutional: He is oriented to person, place, and time. He appears well-developed.  Cardiovascular: Normal rate.  Respiratory: Effort normal.  Genitourinary:    Genitourinary Comments: Deferred   Musculoskeletal: Normal range of motion.  Neurological: He is alert and oriented to person, place, and time.  Skin: Skin is warm.    Review of Systems  Constitutional: Negative for chills and fever.  Respiratory: Negative for cough, shortness of breath and wheezing.   Cardiovascular: Negative for chest pain and palpitations.  Gastrointestinal: Negative for nausea and vomiting.  Neurological: Negative for dizziness and headaches.  Psychiatric/Behavioral: Positive for depression, hallucinations (Hx. psychosis) and substance abuse (Hx. Amphetamine & THC use disorders). Negative for memory loss and suicidal ideas. The patient is not nervous/anxious and does not have insomnia.     Blood pressure 132/88, pulse 86, temperature 98.7 F (37.1 C), temperature source Oral, resp. rate 18, height 6\' 3"  (1.905 m),  weight 66.7 kg, SpO2 100 %.Body mass index is 18.37 kg/m.  General Appearance: Bizarre  Eye Contact:  Fair  Speech:  Clear and Coherent  Volume:  Increased  Mood:  Euphoric  Affect:  Appropriate  Thought Process:  Irrelevant and Descriptions of Associations: Loose  Orientation:  Full (Time, Place, and Person)  Thought Content:  Illogical and Delusions  Suicidal Thoughts:  No  Homicidal Thoughts:  neg  Memory:  Immediate;   Fair Recent;   Fair Remote;   Fair  Judgement:  Fair  Insight:  Present  Psychomotor Activity:  Normal  Concentration:  Concentration: Fair and Attention Span: Fair  Recall:  [employer] of Knowledge:  Fair  Language:  Fair  Akathisia:  Negative  Handed:  Right  AIMS (if indicated):     Assets:  Leisure Time Physical Health Resilience Social Support  ADL's:  Intact  Cognition:  WNL  Sleep: 5.0       Treatment Plan Summary: Daily contact with patient to assess and evaluate symptoms and progress in treatment and Medication management.  -Continue inpatient hospitalization.  -Will continue today 06/01/2019 plan as below except where it is noted.  -Mood control  -Continue risperdal 3 mg po bid.   -Continue Lamictal 25 mg po bid. -EPS.             -Continue benztropine 0.5 mg po bid.  -Anxiety  -Continue Vistaril 50 mg po Q 8 hrs prn.  -Insomnia  -Continue temazepam 30 po Q hs.  -Vitamin supplementation    -Continue Omega-3 acid 1 gm po bid.  -HTN  -Continue cloNIDine 0.1 mg po tid prn for dbp >90  -Encourage participation in groups and therapeutic milieu  -Disposition planning will be ongoing  Lindell Spar, NP, PMHNP, FNP-BC 06/01/2019, 1:43 PM

## 2019-06-01 NOTE — Progress Notes (Signed)
Recreation Therapy Notes  Date: 9.11.20 Time: 1000 Location: [address] Dayroom  Group Topic: Triggers  Goal Area(s) Addresses:  Patient will identify what triggers a response from them. Patient will identify how to deal with triggers when they arise. Patient will identify how to avoid dealing with triggers.  Behavioral Response:  Engaged  Intervention:  Worksheet  Activity: Triggers.  Patients were to identify their three biggest triggers.  Patients were to also identify ways to avoid dealing with triggers and how they face triggers head on.  Education: Communication, Discharge Planning  Education Outcome: Acknowledges understanding/In group clarification offered/Needs additional education.   Clinical Observations/Feedback:  Pt expressed his biggest trigger is other people's pain/suffering.  Pt avoid triggers by breathing and being at peace.  Pt deals with triggers head on by "see the love/light and show others the way".    Victorino Sparrow, LRT/CTRS     Victorino Sparrow A 06/01/2019 11:56 AM

## 2019-06-02 NOTE — [facility] Group Notes (Signed)
  [facility] LCSW Group Therapy Note  Date/Time:  06/02/2019 11:15AM-12:00PM  Type of Therapy and Topic:  Group Therapy:  Feelings About Hospitalization  Participation Level:  Active   Description of Group This process group involved patients discussing their feelings related to being hospitalized, as well as the benefits they see to being in the hospital.  These feelings and benefits were itemized.  The group then brainstormed specific ways in which they could seek those same benefits when they discharge and return home.  Therapeutic Goals 1. Patient will identify and describe positive and negative feelings related to hospitalization 2. Patient will verbalize benefits of hospitalization to themselves personally 3. Patients will brainstorm together ways they can obtain similar benefits in the outpatient setting, identify barriers to wellness and possible solutions  Summary of Patient Progress:  The patient expressed his primary feelings about being hospitalized are conflicted because there are some positive elements and some negative elements to it.  He stated he wishes he was at home, not in the hospital and said that the way he will prevent future hospitalizations is to "not tell the truth about how I feel."  He could not be dissuaded from this.  Therapeutic Modalities Cognitive Behavioral Therapy Motivational Interviewing    Selmer Dominion, LCSW 06/02/2019, 12:32 PM

## 2019-06-02 NOTE — Progress Notes (Signed)
Adult Psychoeducational Group Note  Date:  06/02/2019 Time:  11:17 PM  Group Topic/Focus:  Wrap-Up Group:   The focus of this group is to help patients review their daily goal of treatment and discuss progress on daily workbooks.  Participation Level:  Active  Participation Quality:  Appropriate  Affect:  Appropriate  Cognitive:  Appropriate  Insight: Appropriate  Engagement in Group:  Developing/Improving  Modes of Intervention:  Discussion  Additional Comments:  Pt stated his goals for today was to talk with his doctor about his medication issue, focus on treatment, and to stay awake.  Pt stated he accomplished all his goals today. Pt stated his relationship with his family needs improvement. Pt stated his family does not trust him and he does not trust anyone in his family.  Pt stated he felt better about himself today. Pt rated his overall day an 7 out of 10. Pt stated his appetite was pretty good today. Pt stated his goal for tonight is to get a good night's rest. Pt stated he had pain all over his body tonight. Pt nurse was made aware of the situation.  Pt stated if anything change he would alert staff.  Candy Sledge 06/02/2019, 11:17 PM

## 2019-06-02 NOTE — Progress Notes (Addendum)
[facility] MD Progress Note  06/02/2019 2:18 PM Delore Menner Welch  MRN:  [id number]  Subjective: Dantre reports, "My mood is stable. The medicines I'm taking here are making me feel norm".  Objective: This is the fourth psychiatric admission for Apolinar, a 28 year old patient who is known to have a history of bipolar disorder, history of substance abuse, and history of severe depression in the context of his bipolar disorder requiring at one point ECT therapy and at another time ketamine therapy. He came to our attention on 9/9 after his mother had phoned 911, he had had an altercation with her and was noted to be delusional saying vague/grandiose things such as "things are powerful and I do not want them to fall into the wrong hands" 06-02-19; Goro is seen, chart reviewed. The chart findings discussed with the treatment team. He presents alert, visible on the attending group sessions. Upon this follow-up care assessment, patient presents clearer today than yesterday. He says his medications are making him feel numb. He is however, presenting with a reactive affects. He is taking & tolerating his treatment regimen. There are no behavioral issues reported by staff. He currently denies any SIHI, AVH, delusional thoughts or paranoia. Rory is in agreement to continue current plan of care as already in progress.  Principal Problem: Bipolar I disorder, current or most recent episode manic, with psychotic features (HCC)  Diagnosis: Principal Problem:   Bipolar I disorder, current or most recent episode manic, with psychotic features (HCC) Active Problems:   Cannabis use disorder, moderate, in controlled environment (HCC)  Total Time spent with patient: 15 minutes  Past Psychiatric History: Bipolar 1 disorder.  Past Medical History:  Past Medical History:  Diagnosis Date  . ADD (attention deficit disorder)   . ADHD (attention deficit hyperactivity disorder)   . Allergy   . Anxiety   . Arthritis    neck  . Asthma   . Bipolar 1 disorder (HCC)   . Depression   . Pulmonary embolism (HCC) 10/2016    Past Surgical History:  Procedure Laterality Date  . NO PAST SURGERIES     Family History:  Family History  Problem Relation Age of Onset  . Cancer Father        testicular  . ADD / ADHD Father   . Depression Father   . Alcohol abuse Mother   . Depression Mother   . Anxiety disorder Sister   . Bipolar disorder Maternal Grandmother   . Dementia Maternal Grandmother   . Bipolar disorder Maternal Aunt   . Bipolar disorder Cousin   . Diabetes Neg Hx   . Heart disease Neg Hx   . Early death Neg Hx   . Stroke Neg Hx    Family Psychiatric  History: See H&P.  Social History:  Social History   Substance and Sexual Activity  Alcohol Use Not Currently  . Alcohol/week: 0.0 standard drinks   Comment: none at present     Social History   Substance and Sexual Activity  Drug Use Yes  . Types: Marijuana    Social History   Socioeconomic History  . Marital status: Single    Spouse name: Not on file  . Number of children: Not on file  . Years of education: Not on file  . Highest education level: Not on file  Occupational History  . Not on file  Social Needs  . Financial resource strain: Not on file  . Food insecurity    Worry: Not on  file    Inability: Not on file  . Transportation needs    Medical: Not on file    Non-medical: Not on file  Tobacco Use  . Smoking status: Never Smoker  . Smokeless tobacco: Never Used  Substance and Sexual Activity  . Alcohol use: Not Currently    Alcohol/week: 0.0 standard drinks    Comment: none at present  . Drug use: Yes    Types: Marijuana  . Sexual activity: Not Currently    Partners: Female    Birth control/protection: Condom  Lifestyle  . Physical activity    Days per week: Not on file    Minutes per session: Not on file  . Stress: Not on file  Relationships  . Social Herbalist on phone: Not on file    Gets  together: Not on file    Attends religious service: Not on file    Active member of club or organization: Not on file    Attends meetings of clubs or organizations: Not on file    Relationship status: Not on file  Other Topics Concern  . Not on file  Social History Narrative   Pt lives in [place] with his mother; he stated that he works and is followed by Colonel Bald, NP with [place].   Additional Social History:   Sleep: Good  Appetite:  Good  Current Medications: Current Facility-Administered Medications  Medication Dose Route Frequency Provider Last Rate Last Dose  . acetaminophen (TYLENOL) tablet 650 mg  650 mg Oral Q6H PRN Johnn Hai, MD   650 mg at 06/01/19 2121  . alum & mag hydroxide-simeth (MAALOX/MYLANTA) 200-200-20 MG/5ML suspension 30 mL  30 mL Oral Q4H PRN Johnn Hai, MD      . benztropine (COGENTIN) tablet 0.5 mg  0.5 mg Oral BID Johnn Hai, MD   0.5 mg at 06/02/19 0834  . cloNIDine (CATAPRES) tablet 0.1 mg  0.1 mg Oral TID PRN Johnn Hai, MD      . hydrOXYzine (ATARAX/VISTARIL) tablet 50 mg  50 mg Oral TID PRN Johnn Hai, MD   50 mg at 06/01/19 2323  . lamoTRIgine (LAMICTAL) tablet 25 mg  25 mg Oral BID Johnn Hai, MD   25 mg at 06/02/19 0834  . magnesium hydroxide (MILK OF MAGNESIA) suspension 30 mL  30 mL Oral Daily PRN Johnn Hai, MD      . omega-3 acid ethyl esters (LOVAZA) capsule 1 g  1 g Oral BID Johnn Hai, MD   1 g at 06/02/19 0834  . risperiDONE (RISPERDAL) tablet 3 mg  3 mg Oral BID Johnn Hai, MD   3 mg at 06/02/19 0834  . temazepam (RESTORIL) capsule 30 mg  30 mg Oral QHS Johnn Hai, MD   30 mg at 06/01/19 2118   Lab Results:  No results found for this or any previous visit (from the past 84 hour(s)). Blood Alcohol level:  Lab Results  Component Value Date   ETH <10 05/30/2019   ETH <5 16/06/9603   Metabolic Disorder Labs: Lab Results  Component Value Date   HGBA1C 5.3 09/18/2016   MPG 105 09/18/2016   No  results found for: PROLACTIN Lab Results  Component Value Date   CHOL 109 09/18/2016   TRIG 95 09/18/2016   HDL 43 09/18/2016   CHOLHDL 2.5 09/18/2016   VLDL 19 09/18/2016   LDLCALC 47 09/18/2016   LDLCALC 79 01/16/2015   Physical Findings: AIMS: Facial and Oral Movements  Muscles of Facial Expression: None, normal Lips and Perioral Area: None, normal Jaw: None, normal Tongue: None, normal,Extremity Movements Upper (arms, wrists, hands, fingers): None, normal Lower (legs, knees, ankles, toes): None, normal, Trunk Movements Neck, shoulders, hips: None, normal, Overall Severity Severity of abnormal movements (highest score from questions above): None, normal Incapacitation due to abnormal movements: None, normal Patient's awareness of abnormal movements (rate only patient's report): No Awareness, Dental Status Current problems with teeth and/or dentures?: No Does patient usually wear dentures?: No  CIWA:    COWS:     Musculoskeletal: Strength & Muscle Tone: within normal limits Gait & Station: normal Patient leans: N/A  Psychiatric Specialty Exam: Physical Exam  Nursing note and vitals reviewed. Constitutional: He is oriented to person, place, and time. He appears well-developed.  Cardiovascular: Normal rate.  Respiratory: Effort normal.  Genitourinary:    Genitourinary Comments: Deferred   Musculoskeletal: Normal range of motion.  Neurological: He is alert and oriented to person, place, and time.  Skin: Skin is warm.    Review of Systems  Constitutional: Negative for chills and fever.  Respiratory: Negative for cough, shortness of breath and wheezing.   Cardiovascular: Negative for chest pain and palpitations.  Gastrointestinal: Negative for nausea and vomiting.  Neurological: Negative for dizziness and headaches.  Psychiatric/Behavioral: Positive for depression, hallucinations (Hx. psychosis) and substance abuse (Hx. Amphetamine & THC use disorders). Negative for  memory loss and suicidal ideas. The patient is not nervous/anxious and does not have insomnia.     Blood pressure 136/88, pulse (!) 102, temperature (!) 97.5 F (36.4 C), temperature source Oral, resp. rate 18, height 6\' 3"  (1.905 m), weight 66.7 kg, SpO2 100 %.Body mass index is 18.37 kg/m.  General Appearance: Bizarre  Eye Contact:  Fair  Speech:  Clear and Coherent  Volume:  Increased  Mood:  Euphoric  Affect:  Appropriate  Thought Process:  Irrelevant and Descriptions of Associations: Loose  Orientation:  Full (Time, Place, and Person)  Thought Content:  Illogical and Delusions  Suicidal Thoughts:  No  Homicidal Thoughts:  neg  Memory:  Immediate;   Fair Recent;   Fair Remote;   Fair  Judgement:  Fair  Insight:  Present  Psychomotor Activity:  Normal  Concentration:  Concentration: Fair and Attention Span: Fair  Recall:  [employer]Fair  Fund of Knowledge:  Fair  Language:  Fair  Akathisia:  Negative  Handed:  Right  AIMS (if indicated):     Assets:  Leisure Time Physical Health Resilience Social Support  ADL's:  Intact  Cognition:  WNL  Sleep: 5.0       Treatment Plan Summary: Daily contact with patient to assess and evaluate symptoms and progress in treatment and Medication management.  -Continue inpatient hospitalization.  -Will continue today 06/02/2019 plan as below except where it is noted.  -Mood control  -Continue risperdal 3 mg po bid.   -Continue Lamictal 25 mg po bid. -EPS.             -Continue benztropine 0.5 mg po bid.  -Anxiety  -Continue Vistaril 50 mg po Q 8 hrs prn.  -Insomnia  -Continue temazepam 30 po Q hs.  -Vitamin supplementation    -Continue Omega-3 acid 1 gm po bid.  -HTN  -Continue cloNIDine 0.1 mg po tid prn for dbp >90  -Encourage participation in groups and therapeutic milieu  -Disposition planning will be ongoing  Armandina StammerAgnes Nwoko, NP, PMHNP, FNP-BC 06/02/2019, 2:18 PMPatient ID: Bing Neighborsharles A Gomer, male   DOB:  April 19, 1991, 28 y.o.   MRN:  [id number] Attest to NP progress note

## 2019-06-02 NOTE — Progress Notes (Signed)
DAR NOTE: Patient presents with anxious affect and depressed mood.  Complained of pain all his body. Pt stated his was brain was busted open and could see blood flowing. Pt kept pacing on the hallway most of the night stating he could not sleep since he slept most of the day.  Denies  auditory and visual hallucinations. Maintained on routine safety checks.  Medications given as prescribed.  Support and encouragement offered as needed.  Will continue to monitor.

## 2019-06-03 NOTE — Plan of Care (Signed)
Nurse discussed anxiety, depression, coping skills with patient. 

## 2019-06-03 NOTE — [facility] Group Notes (Signed)
[facility] LCSW Group Therapy Note  Date/Time:  06/03/2019  11:00AM-12:00PM  Type of Therapy and Topic:  Group Therapy:  Music and Mood  Participation Level:  Active   Description of Group: In this process group, members listened to a variety of genres of music and identified that different types of music evoke different responses.  Patients were encouraged to identify music that was soothing for them and music that was energizing for them.  Patients discussed how this knowledge can help with wellness and recovery in various ways including managing depression and anxiety as well as encouraging healthy sleep habits.    Therapeutic Goals: 1. Patients will explore the impact of different varieties of music on mood 2. Patients will verbalize the thoughts they have when listening to different types of music 3. Patients will identify music that is soothing to them as well as music that is energizing to them 4. Patients will discuss how to use this knowledge to assist in maintaining wellness and recovery 5. Patients will explore the use of music as a coping skill  Summary of Patient Progress:  At the beginning of group, patient expressed that he felt "quite well because I've figure out why I'm here."  He said that he was feeling groggy because of his medications.  At the end of group, during which he participated fully and with obvious enjoyment, he said he felt "blessed."  Therapeutic Modalities: Solution Focused Brief Therapy Activity   Selmer Dominion, LCSW

## 2019-06-03 NOTE — Progress Notes (Signed)
Patient has been isolative to his room resting until medication pass. He reported that the Risperdal makes him way too drowsy. Writer encouraged him to speak to his doctor about this medication. He reported having had a good day. Writer asked what plans he had after discharge. He reports that he plans to go to [place] with a one way ticket. He received snack and sat up in the dayroom briefly before returning to his room.Safety maintained with 15 min checks.

## 2019-06-03 NOTE — Progress Notes (Addendum)
[facility]  06/03/2019 11:42 AM Isaiah Garza  MRN:  [id number]  Subjective: Isaiah Garza reports, "I'm doing great. I'm just ready to be discharged. I have been doing what I'm asked to do, taking the medicines. I'm feeling good".  Objective: This is the fourth psychiatric admission for Isaiah Garza, a 28 year old patient who is known to have a history of bipolar disorder, history of substance abuse, and history of severe depression in the context of his bipolar disorder requiring at one point ECT therapy and at another time ketamine therapy. He came to our attention on 9/9 after his mother had phoned 911, he had had an altercation with her and was noted to be delusional saying vague/grandiose things such as "things are powerful and I do not want them to fall into the wrong hands" 06-03-19; Isaiah Garza is seen, chart reviewed. The chart findings discussed with the treatment team. He presents alert, visible on the attending group sessions. Upon this follow-up care assessment, patient presents in a good mood. He says his medications are helping & he is taking them as he should. His affect is good & reactive. He is taking & tolerating his treatment regimen. Denies any side effects. There are no behavioral issues reported by staff. He currently denies any SIHI, AVH, delusional thoughts or paranoia. Isaiah Garza is in agreement to continue current plan of care as already in progress. He wrote a letter to the attending psychiatrist, this was placed on his desk in his office.  Principal Problem: Bipolar I disorder, current or most recent episode manic, with psychotic features (HCC)  Diagnosis: Principal Problem:   Bipolar I disorder, current or most recent episode manic, with psychotic features (HCC) Active Problems:   Cannabis use disorder, moderate, in controlled environment (HCC)  Total Time spent with patient: 15 minutes  Past Psychiatric History: Bipolar 1 disorder.  Past Medical History:  Past Medical  History:  Diagnosis Date  . ADD (attention deficit disorder)   . ADHD (attention deficit hyperactivity disorder)   . Allergy   . Anxiety   . Arthritis    neck  . Asthma   . Bipolar 1 disorder (HCC)   . Depression   . Pulmonary embolism (HCC) 10/2016    Past Surgical History:  Procedure Laterality Date  . NO PAST SURGERIES     Family History:  Family History  Problem Relation Age of Onset  . Cancer Father        testicular  . ADD / ADHD Father   . Depression Father   . Alcohol abuse Mother   . Depression Mother   . Anxiety disorder Sister   . Bipolar disorder Maternal Grandmother   . Dementia Maternal Grandmother   . Bipolar disorder Maternal Aunt   . Bipolar disorder Cousin   . Diabetes Neg Hx   . Heart disease Neg Hx   . Early death Neg Hx   . Stroke Neg Hx    Family Psychiatric  History: See H&P.  Social History:  Social History   Substance and Sexual Activity  Alcohol Use Not Currently  . Alcohol/week: 0.0 standard drinks   Comment: none at present     Social History   Substance and Sexual Activity  Drug Use Yes  . Types: Marijuana    Social History   Socioeconomic History  . Marital status: Single    Spouse name: Not on file  . Number of children: Not on file  . Years of education: Not on file  .  Highest education level: Not on file  Occupational History  . Not on file  Social Needs  . Financial resource strain: Not on file  . Food insecurity    Worry: Not on file    Inability: Not on file  . Transportation needs    Medical: Not on file    Non-medical: Not on file  Tobacco Use  . Smoking status: Never Smoker  . Smokeless tobacco: Never Used  Substance and Sexual Activity  . Alcohol use: Not Currently    Alcohol/week: 0.0 standard drinks    Comment: none at present  . Drug use: Yes    Types: Marijuana  . Sexual activity: Not Currently    Partners: Female    Birth control/protection: Condom  Lifestyle  . Physical activity    Days  per week: Not on file    Minutes per session: Not on file  . Stress: Not on file  Relationships  . Social Musicianconnections    Talks on phone: Not on file    Gets together: Not on file    Attends religious service: Not on file    Active member of club or organization: Not on file    Attends meetings of clubs or organizations: Not on file    Relationship status: Not on file  Other Topics Concern  . Not on file  Social History Narrative   [place] lives in [place] with his mother; he stated that he works and is followed by Augusto GarbeJohn Cliff, NP with [facility].   Additional Social History:   Sleep: Good  Appetite:  Good  Current Medications: Current Facility-Administered Medications  Medication Dose Route Frequency Provider Last Rate Last Dose  . acetaminophen (TYLENOL) tablet 650 mg  650 mg Oral Q6H PRN Malvin JohnsFarah, Brian, MD   650 mg at 06/03/19 0839  . alum & mag hydroxide-simeth (MAALOX/MYLANTA) 200-200-20 MG/5ML suspension 30 mL  30 mL Oral Q4H PRN Malvin JohnsFarah, Brian, MD   30 mL at 06/03/19 0958  . benztropine (COGENTIN) tablet 0.5 mg  0.5 mg Oral BID Malvin JohnsFarah, Brian, MD   0.5 mg at 06/03/19 0803  . cloNIDine (CATAPRES) tablet 0.1 mg  0.1 mg Oral TID PRN Malvin JohnsFarah, Brian, MD      . hydrOXYzine (ATARAX/VISTARIL) tablet 50 mg  50 mg Oral TID PRN Malvin JohnsFarah, Brian, MD   50 mg at 06/01/19 2323  . lamoTRIgine (LAMICTAL) tablet 25 mg  25 mg Oral BID Malvin JohnsFarah, Brian, MD   25 mg at 06/03/19 0802  . magnesium hydroxide (MILK OF MAGNESIA) suspension 30 mL  30 mL Oral Daily PRN Malvin JohnsFarah, Brian, MD      . omega-3 acid ethyl esters (LOVAZA) capsule 1 g  1 g Oral BID Malvin JohnsFarah, Brian, MD   1 g at 06/03/19 0802  . risperiDONE (RISPERDAL) tablet 3 mg  3 mg Oral BID Malvin JohnsFarah, Brian, MD   3 mg at 06/03/19 0802  . temazepam (RESTORIL) capsule 30 mg  30 mg Oral QHS Malvin JohnsFarah, Brian, MD   30 mg at 06/02/19 2124   Lab Results:  No results found for this or any previous visit (from the past 48 hour(s)). Blood Alcohol level:  Lab Results   Component Value Date   [facility]   ETH <5 09/12/2016   Metabolic Disorder Labs: Lab Results  Component Value Date   HGBA1C 5.3 09/18/2016   MPG 105 09/18/2016   No results found for: PROLACTIN Lab Results  Component Value Date   CHOL 109 09/18/2016  TRIG 95 09/18/2016   HDL 43 09/18/2016   CHOLHDL 2.5 09/18/2016   VLDL 19 09/18/2016   LDLCALC 47 09/18/2016   LDLCALC 79 01/16/2015   Physical Findings: AIMS: Facial and Oral Movements Muscles of Facial Expression: None, normal Lips and Perioral Area: None, normal Jaw: None, normal Tongue: None, normal,Extremity Movements Upper (arms, wrists, hands, fingers): None, normal Lower (legs, knees, ankles, toes): None, normal, Trunk Movements Neck, shoulders, hips: None, normal, Overall Severity Severity of abnormal movements (highest score from questions above): None, normal Incapacitation due to abnormal movements: None, normal Patient's awareness of abnormal movements (rate only patient's report): No Awareness, Dental Status Current problems with teeth and/or dentures?: No Does patient usually wear dentures?: No  CIWA:  CIWA-Ar Total: 1 COWS:  COWS Total Score: 2  Musculoskeletal: Strength & Muscle Tone: within normal limits Gait & Station: normal Patient leans: N/A  Psychiatric Specialty Exam: Physical Exam  Nursing note and vitals reviewed. Constitutional: He is oriented to person, place, and time. He appears well-developed.  Cardiovascular: Normal rate.  Respiratory: Effort normal.  Genitourinary:    Genitourinary Comments: Deferred   Musculoskeletal: Normal range of motion.  Neurological: He is alert and oriented to person, place, and time.  Skin: Skin is warm.    Review of Systems  Constitutional: Negative for chills and fever.  Respiratory: Negative for cough, shortness of breath and wheezing.   Cardiovascular: Negative for chest pain and palpitations.  Gastrointestinal: Negative for nausea and  vomiting.  Neurological: Negative for dizziness and headaches.  Psychiatric/Behavioral: Positive for depression, hallucinations (Hx. psychosis) and substance abuse (Hx. Amphetamine & THC use disorders). Negative for memory loss and suicidal ideas. The patient is not nervous/anxious and does not have insomnia.     Blood pressure 131/80, pulse 92, temperature 98.4 F (36.9 C), temperature source Oral, resp. rate 18, height 6\' 3"  (1.905 m), weight 66.7 kg, SpO2 100 %.Body mass index is 18.37 kg/m.  General Appearance: Casual, thin-framed  Eye Contact:  Fair  Speech:  Clear and Coherent  Volume: Normal  Mood:  Euphoric  Affect:  Appropriate  Thought Process:  Irrelevant and Descriptions of Associations: Loose  Orientation:  Full (Time, Place, and Person)  Thought Content: Ruminations.  Suicidal Thoughts: Denies  Homicidal Thoughts: Denies  Memory:  Immediate;   Fair Recent;   Fair Remote;   Fair  Judgement:  Fair  Insight: Present  Psychomotor Activity:  Normal  Concentration:  Concentration: Fair and Attention Span: Fair  Recall:  [employer] of Knowledge:  Fair  Language:  Fair  Akathisia:  Negative  Handed:  Right  AIMS (if indicated):     Assets:  Leisure Time Physical Health Resilience Social Support  ADL's:  Intact  Cognition:  WNL  Sleep: 5.0       Treatment Plan Summary: Daily contact with patient to assess and evaluate symptoms and progress in treatment and Medication management.  -Continue inpatient hospitalization.  -Will continue today 06/03/2019 plan as below except where it is noted.  -Mood control  -Continue risperdal 3 mg po bid.   -Continue Lamictal 25 mg po bid. -EPS.             -Continue benztropine 0.5 mg po bid.  -Anxiety  -Continue Vistaril 50 mg po Q 8 hrs prn.  -Insomnia  -Continue temazepam 30 po Q hs.  -Vitamin supplementation    -Continue Omega-3 acid 1 gm po bid.  -HTN  -Continue cloNIDine 0.1 mg po tid prn for dbp  >  60  -Encourage participation in groups and therapeutic milieu  -Disposition planning will be ongoing  Armandina Stammer, NP, PMHNP, FNP-BC 06/03/2019, 11:42 AMPatient ID: Isaiah Neighbors Cermak, male   DOB: 03-02-1991, 5 y.o.   MRN: [id number] Patient ID: Isaiah Neighbors Kukla, male   DOB: 05/11/1991, 34 y.o.   MRN: [id number] Attest to NP progress note

## 2019-06-03 NOTE — Progress Notes (Signed)
D:  Patient's self inventory sheet, patient sleeps good, no sleep medication.  Good appetite, normal energy level, good concentration.  Good appetite, normal energy level, good concentration.  Denied depression, hopeless and anxiety.  Denied withdrawals.  Denied SI.  Physical problems.  Headaches blurred vision.  Physical pain, back, wrists, neck, body aches.  Pain medication not helpful.  Goal is discharge. Will discuss discharge information with MD.  No discharge plan at this time. A:  Medications administered per MD orders.  Emotional support and encouragement given patient. R:  Patient denied SI and HI, contracts for safety.  Denied A/V hallucinations.  Safety maintained with 15 minute checks. Marland Kitchen  Marland Kitchen

## 2019-06-03 NOTE — Progress Notes (Signed)
Patient had a visit from his father on tonight and he reports the visit was good. He has been up in the dayroom more tonight than the previous night. He still reports that the risperdal seems too strong for him because it causes him to sleep during the daytime. He was pleasant and polite. Writer gave compliment on his hair and he smiled. Safety maintained with 15 min checks.

## 2019-06-03 NOTE — Progress Notes (Signed)
Adult Psychoeducational Group Note  Date:  06/03/2019 Time:  10:39 PM  Group Topic/Focus:  Wrap-Up Group:   The focus of this group is to help patients review their daily goal of treatment and discuss progress on daily workbooks.  Participation Level:  Active  Participation Quality:  Appropriate  Affect:  Appropriate  Cognitive:  Appropriate  Insight: Appropriate  Engagement in Group:  Developing/Improving  Modes of Intervention:  Discussion  Additional Comments:  Pt stated his goal for today was to focus on his treatment plan. Pt stated he accomplished his goal today.  Pt stated his relationship with his family has improved since he was admitted here. Pt stated his father came by for visitation and he contacted his mother today as well.  Pt stated he felt better about himself today. Pt rated his overall day an 8 out of 10. Pt stated his appetite was pretty good today. Pt stated his goal for tonight is to get a good night's rest. Pt stated he had pain in his back, neck, arm, and head tonight. Pt nurse was made aware of the situation.  Pt stated if anything change he would alert staff.  Candy Sledge 06/03/2019, 10:39 PM

## 2019-06-04 MED ORDER — LAMOTRIGINE 25 MG PO TABS
50.0000 mg | ORAL_TABLET | Freq: Two times a day (BID) | ORAL | Status: DC
  Administered 2019-06-04 – 2019-06-06 (×4): 50 mg via ORAL
  Filled 2019-06-04 (×6): qty 2

## 2019-06-04 MED ORDER — RISPERIDONE 2 MG PO TABS
2.0000 mg | ORAL_TABLET | Freq: Two times a day (BID) | ORAL | Status: DC
  Administered 2019-06-04 – 2019-06-06 (×4): 2 mg via ORAL
  Filled 2019-06-04 (×6): qty 1

## 2019-06-04 NOTE — [facility] Suicide Risk Assessment (Signed)
[place] INPATIENT:  Family/Significant Other Suicide Prevention Education  Suicide Prevention Education:  Education Completed; Isaiah Garza, mother [phone]  has been identified by the patient as the family member/significant other with whom the patient will be residing, and identified as the person(s) who will aid the patient in the event of a mental health crisis (suicidal ideations/suicide attempt).  With written consent from the patient, the family member/significant other has been provided the following suicide prevention education, prior to the and/or following the discharge of the patient.  The suicide prevention education provided includes the following:  Suicide risk factors  Suicide prevention and interventions  National Suicide Hotline telephone number  [facility] assessment telephone number  [facility] Emergency Assistance [place] and/or Residential Mobile Crisis Unit telephone number  Request made of family/significant other to:  Remove weapons (e.g., guns, rifles, knives), all items previously/currently identified as safety concern.    Remove drugs/medications (over-the-counter, prescriptions, illicit drugs), all items previously/currently identified as a safety concern.  The family member/significant other verbalizes understanding of the suicide prevention education information provided.  The family member/significant other agrees to remove the items of safety concern listed above.  Patient has lived with mother for the past 3 years. Patient's father, Isaiah Garza, has spoken with psychiatry.  Mother has general concerns regarding the patient's readiness for discharge, "I don't see how a couple days will be enough." She shares he has a long history of mental illness, with significant periods of deep depression. She has concerns about his functioning and thinks he would benefit from disability income.   Mother has no specific safety  concerns regarding SI/HI. She notes the patient can be severely depressed "but I don't think he'd kill himself."  Mother asked questions regarding medication changes, but declined a more detailed review stating the patient's father usually handles medication.   Isaiah Garza 06/04/2019, 3:51 PM

## 2019-06-04 NOTE — Progress Notes (Signed)
[facility] MD Progress Note  06/04/2019 2:02 PM Isaiah Garza  MRN:  [id number] Subjective:    Patient generally cordial focused on discharge his father feels that he will not be ready quickly and is in no hurry spoke with father with his permission.  At any rate the patient himself is alert oriented and generally cooperative without current thoughts of harming self or others is describing is previously expressed delusional material as "badly time jokes" when he elaborated that other people thought he was a God but he stated he never had those delusional believes he denies hallucinations.  He does confess that he abused hallucinogens/acid or LSD something of that sort with his coworker or boss prior to coming in and "that is why I am here" and cautioned against these types of abuse  principal Problem: Bipolar I disorder, current or most recent episode manic, with psychotic features ([place]) Diagnosis: Principal Problem:   Bipolar I disorder, current or most recent episode manic, with psychotic features ([place]) Active Problems:   Cannabis use disorder, moderate, in controlled environment ([place])  Total Time spent with patient: 20 minutes  Past Psychiatric History: Extensive  Past Medical History:  Past Medical History:  Diagnosis Date  . ADD (attention deficit disorder)   . ADHD (attention deficit hyperactivity disorder)   . Allergy   . Anxiety   . Arthritis    neck  . Asthma   . Bipolar 1 disorder ([place])   . Depression   . Pulmonary embolism ([place]) 10/2016    Past Surgical History:  Procedure Laterality Date  . NO PAST SURGERIES     Family History:  Family History  Problem Relation Age of Onset  . Cancer Father        testicular  . ADD / ADHD Father   . Depression Father   . Alcohol abuse Mother   . Depression Mother   . Anxiety disorder Sister   . Bipolar disorder Maternal Grandmother   . Dementia Maternal Grandmother   . Bipolar disorder Maternal Aunt   . Bipolar disorder Cousin    . Diabetes Neg Hx   . Heart disease Neg Hx   . Early death Neg Hx   . Stroke Neg Hx    Family Psychiatric  History: neg Social History:  Social History   Substance and Sexual Activity  Alcohol Use Not Currently  . Alcohol/week: 0.0 standard drinks   Comment: none at present     Social History   Substance and Sexual Activity  Drug Use Yes  . Types: Marijuana    Social History   Socioeconomic History  . Marital status: Single    Spouse name: Not on file  . Number of Garza: Not on file  . Years of education: Not on file  . Highest education level: Not on file  Occupational History  . Not on file  Social Needs  . Financial resource strain: Not on file  . Food insecurity    Worry: Not on file    Inability: Not on file  . Transportation needs    Medical: Not on file    Non-medical: Not on file  Tobacco Use  . Smoking status: Never Smoker  . Smokeless tobacco: Never Used  Substance and Sexual Activity  . Alcohol use: Not Currently    Alcohol/week: 0.0 standard drinks    Comment: none at present  . Drug use: Yes    Types: Marijuana  . Sexual activity: Not Currently    Partners: Female  Birth control/protection: Condom  Lifestyle  . Physical activity    Days per week: Not on file    Minutes per session: Not on file  . Stress: Not on file  Relationships  . Social Musicianconnections    Talks on phone: Not on file    Gets together: Not on file    Attends religious service: Not on file    Active member of club or organization: Not on file    Attends meetings of clubs or organizations: Not on file    Relationship status: Not on file  Other Topics Concern  . Not on file  Social History Narrative   Pt lives in [place] with his mother; he stated that he works and is followed by Augusto GarbeJohn Cliff, NP with [facility].   Additional Social History:                         Sleep: Fair  Appetite:  Fair  Current Medications: Current  Facility-Administered Medications  Medication Dose Route Frequency Provider Last Rate Last Dose  . acetaminophen (TYLENOL) tablet 650 mg  650 mg Oral Q6H PRN Malvin JohnsFarah, Kemaria Dedic, MD   650 mg at 06/04/19 0837  . alum & mag hydroxide-simeth (MAALOX/MYLANTA) 200-200-20 MG/5ML suspension 30 mL  30 mL Oral Q4H PRN Malvin JohnsFarah, Cariah Salatino, MD   30 mL at 06/03/19 1820  . benztropine (COGENTIN) tablet 0.5 mg  0.5 mg Oral BID Malvin JohnsFarah, Reegan Bouffard, MD   0.5 mg at 06/04/19 [id number]  . cloNIDine (CATAPRES) tablet 0.1 mg  0.1 mg Oral TID PRN Malvin JohnsFarah, Zuri Bradway, MD      . hydrOXYzine (ATARAX/VISTARIL) tablet 50 mg  50 mg Oral TID PRN Malvin JohnsFarah, Ashtin Rosner, MD   50 mg at 06/01/19 2323  . lamoTRIgine (LAMICTAL) tablet 25 mg  25 mg Oral BID Malvin JohnsFarah, Froylan Hobby, MD   25 mg at 06/04/19 [id number]  . magnesium hydroxide (MILK OF MAGNESIA) suspension 30 mL  30 mL Oral Daily PRN Malvin JohnsFarah, Elizah Lydon, MD      . omega-3 acid ethyl esters (LOVAZA) capsule 1 g  1 g Oral BID Malvin JohnsFarah, Rameses Ou, MD   1 g at 06/04/19 [id number]  . risperiDONE (RISPERDAL) tablet 3 mg  3 mg Oral BID Malvin JohnsFarah, Mckenzey Parcell, MD   Stopped at 06/04/19 [phone]  . temazepam (RESTORIL) capsule 30 mg  30 mg Oral QHS Malvin JohnsFarah, Myleen Brailsford, MD   30 mg at 06/03/19 2056    Lab Results: No results found for this or any previous visit (from the past 48 hour(s)).  Blood Alcohol level:  Lab Results  Component Value Date   ETH <10 05/30/2019   ETH <5 09/12/2016    Metabolic Disorder Labs: Lab Results  Component Value Date   HGBA1C 5.3 09/18/2016   MPG 105 09/18/2016   No results found for: PROLACTIN Lab Results  Component Value Date   CHOL 109 09/18/2016   TRIG 95 09/18/2016   HDL 43 09/18/2016   CHOLHDL 2.5 09/18/2016   VLDL 19 09/18/2016   LDLCALC 47 09/18/2016   LDLCALC 79 01/16/2015    Physical Findings: AIMS: Facial and Oral Movements Muscles of Facial Expression: None, normal Lips and Perioral Area: None, normal Jaw: None, normal Tongue: None, normal,Extremity Movements Upper (arms, wrists, hands, fingers): None,  normal Lower (legs, knees, ankles, toes): None, normal, Trunk Movements Neck, shoulders, hips: None, normal, Overall Severity Severity of abnormal movements (highest score from questions above): None, normal Incapacitation due to abnormal movements: None, normal Patient's awareness of  abnormal movements (rate only patient's report): No Awareness, Dental Status Current problems with teeth and/or dentures?: No Does patient usually wear dentures?: No  CIWA:  CIWA-Ar Total: 1 COWS:  COWS Total Score: 2  Musculoskeletal: Strength & Muscle Tone: within normal limits Gait & Station: normal Patient leans: N/A  Psychiatric Specialty Exam: Physical Exam  ROS  Blood pressure 125/85, pulse 75, temperature 97.7 F (36.5 C), temperature source Oral, resp. rate 18, height 6\' 3"  (1.905 m), weight 66.7 kg, SpO2 100 %.Body mass index is 18.37 kg/m.  General Appearance: Casual  Eye Contact:  Good  Speech:  Clear and Coherent  Volume:  Normal  Mood:  Euthymic  Affect:  Full Range  Thought Process:  Coherent, Linear and Descriptions of Associations: Intact  Orientation:  Other:  Fully oriented  Thought Content:  Logical  Suicidal Thoughts:  No  Homicidal Thoughts:  No  Memory:  Recent;   Fair  Judgement:  Fair  Insight:  Fair  Psychomotor Activity:  Normal  Concentration:  Concentration: Fair  Recall:  [employer] of Knowledge:  Fair  Language:  Fair  Akathisia:  Negative  Handed:  Right  AIMS (if indicated):     Assets:  Physical Health Resilience Social Support  ADL's:  Intact  Cognition:  WNL  Sleep:  Number of Hours: 6.75     Treatment Plan Summary: Daily contact with patient to assess and evaluate symptoms and progress in treatment and Medication management  Continue current reality based and cognitive-based therapy continue meds and illness education continue discuss with family with his permission probable discharge in 1 to 2 days actually doing quite well we will escalate  lamotrigine we will reduce risperidone we will look into patient assistance for cariprazine  Malvin Johns, MD 06/04/2019, 2:02 PM

## 2019-06-04 NOTE — Progress Notes (Signed)
Chaplain follow up during rounding.  Continued support around hospitalization

## 2019-06-04 NOTE — Progress Notes (Signed)
Recreation Therapy Notes  Date: 9.14.20 Time: 1000 Location: [address] Dayroom  Group Topic: Wellness  Goal Area(s) Addresses:  Patient will define components of whole wellness. Patient will verbalize benefit of whole wellness.  Behavioral Response:  Engaged  Intervention:  Music  Activity:  Exercise.  LRT explained instructions for activity to patients.  Each patient was given the opportunity to lead the group in a stretching exercise or exercise of their choice.  Patients were encouraged to listen to their bodies, take breaks if needed and not push themselves if anything hurt.  Education: Wellness, Dentist.   Education Outcome: Acknowledges education/In group clarification offered/Needs additional education.   Clinical Observations/Feedback:  Pt was very active and attentive during session.  Pt lead the group in the stretches and completed all exercises presented in group.    Victorino Sparrow, LRT/CTRS     Ria Comment, Brooklynne Pereida A 06/04/2019 11:05 AM

## 2019-06-05 NOTE — Progress Notes (Signed)
Adult Psychoeducational Group Note  Date:  06/05/2019 Time:  9:13 PM  Group Topic/Focus:  Wrap-Up Group:   The focus of this group is to help patients review their daily goal of treatment and discuss progress on daily workbooks.  Participation Level:  Active  Participation Quality:  Appropriate  Affect:  Appropriate  Cognitive:  Appropriate  Insight: Appropriate  Engagement in Group:  Developing/Improving  Modes of Intervention:  Discussion  Additional Comments: Pt stated her goal for today was to focus on her treatment. Pt stated she felt she accomplished her goal today. Pt stated her relationship with her family has improved since she was admitted here. Pt stated she felt better about herself today. Pt rated her overall day a 7 out of 10. Pt stated her appetite was pretty good today. Pt stated her goal for tonight is to get another good night's rest. Pt stated she had pain in her back and neck tonight. Pt nurse was made aware of the situation. Pt stated if anything change she would alert staff.   Candy Sledge 06/05/2019, 9:13 PM

## 2019-06-05 NOTE — Progress Notes (Signed)
D: Pt denies SI/HI/AVH. Pt is pleasant and cooperative. Pt stated he was doing better. Pt given tinformation to help with his back pain , pt encouraged to look into Complementary and Alternative Methods to help with pain.   A: Pt was offered support and encouragement. Pt was given scheduled medications. Pt was encourage to attend groups. Q 15 minute checks were done for safety.  R: safety maintained on unit.

## 2019-06-05 NOTE — Progress Notes (Signed)
D: Pt denies SI/HI/AVH. Pt is pleasant and cooperative. Pt stated he had good day, pt stated he thought about what we talked about last night. Pt stated he plans to put things he's learning into action to help people.   A: Pt was offered support and encouragement. Pt was given scheduled medications. Pt was encourage to attend groups. Q 15 minute checks were done for safety.   R:Pt attends groups and interacts well with peers and staff. Pt is taking medication. Pt has no complaints.Pt receptive to treatment and safety maintained on unit.

## 2019-06-05 NOTE — Progress Notes (Signed)
Recreation Therapy Notes  Date: 9.15.20 Time: 1000 Location: 500 Hall Dayroom  Group Topic: Communication  Goal Area(s) Addresses:  Patient will effectively communicate with peers in group.  Patient will verbalize benefit of healthy communication. Patient will verbalize positive effect of healthy communication on post d/c goals.  Patient will identify communication techniques that made activity effective for group.   Behavioral Response: Engaged  Intervention:  Pencils, Paper, Geometrical pictures  Activity:  Geometrical Drawings.  One member of the group described a geometrical drawing to the group.  The person describing the picture was to be as detailed as possible.  The remaining members of the group could only ask the speaker to repeat themselves.  They could not ask any detailed questions.  This process will be repeated 2 more times with two different pictures and 2 new presenters.  Education: Communication, Discharge Planning  Education Outcome: Acknowledges understanding/In group clarification offered/Needs additional education.   Clinical Observations/Feedback:  Pt was active during activity.  Peers expressed patient gave good examples using the size of the pencils but was a little too detailed.  Pt expressed he forgot to give specific instructions and felt he could have done better if he had more time to process the picture.  During processing, stated your own perspective can affect communication.  When given a scenario of a shy person writing a letter or e-mail, pt expressed a letter would be a good option to express their feelings but they don't give context which can be misconstrued.    Isaiah Garza, LRT/CTRS    Ria Comment, Robynn Marcel A 06/05/2019 11:21 AM

## 2019-06-05 NOTE — Progress Notes (Signed)
[facility] MD Progress Note  06/05/2019 10:01 AM Isaiah Garza  MRN:  [id number] Subjective:   Patient showing much improvement continues to minimize past symptoms denies current auditory or visual hallucinations denies wanting to harm self mood is generally euthymic he is conversant no delusional material expressed again no thoughts of harming self or others no EPS or TD Principal Problem: Bipolar I disorder, current or most recent episode manic, with psychotic features (HCC) Diagnosis: Principal Problem:   Bipolar I disorder, current or most recent episode manic, with psychotic features (HCC) Active Problems:   Cannabis use disorder, moderate, in controlled environment (HCC)  Total Time spent with patient: 20 minutes  Past Psychiatric History: Extensive bipolar history including ECT in the past  Past Medical History:  Past Medical History:  Diagnosis Date  . ADD (attention deficit disorder)   . ADHD (attention deficit hyperactivity disorder)   . Allergy   . Anxiety   . Arthritis    neck  . Asthma   . Bipolar 1 disorder (HCC)   . Depression   . Pulmonary embolism (HCC) 10/2016    Past Surgical History:  Procedure Laterality Date  . NO PAST SURGERIES     Family History:  Family History  Problem Relation Age of Onset  . Cancer Father        testicular  . ADD / ADHD Father   . Depression Father   . Alcohol abuse Mother   . Depression Mother   . Anxiety disorder Sister   . Bipolar disorder Maternal Grandmother   . Dementia Maternal Grandmother   . Bipolar disorder Maternal Aunt   . Bipolar disorder Cousin   . Diabetes Neg Hx   . Heart disease Neg Hx   . Early death Neg Hx   . Stroke Neg Hx    Family Psychiatric  History: neg Social History:  Social History   Substance and Sexual Activity  Alcohol Use Not Currently  . Alcohol/week: 0.0 standard drinks   Comment: none at present     Social History   Substance and Sexual Activity  Drug Use Yes  . Types: Marijuana     Social History   Socioeconomic History  . Marital status: Single    Spouse name: Not on file  . Number of children: Not on file  . Years of education: Not on file  . Highest education level: Not on file  Occupational History  . Not on file  Social Needs  . Financial resource strain: Not on file  . Food insecurity    Worry: Not on file    Inability: Not on file  . Transportation needs    Medical: Not on file    Non-medical: Not on file  Tobacco Use  . Smoking status: Never Smoker  . Smokeless tobacco: Never Used  Substance and Sexual Activity  . Alcohol use: Not Currently    Alcohol/week: 0.0 standard drinks    Comment: none at present  . Drug use: Yes    Types: Marijuana  . Sexual activity: Not Currently    Partners: Female    Birth control/protection: Condom  Lifestyle  . Physical activity    Days per week: Not on file    Minutes per session: Not on file  . Stress: Not on file  Relationships  . Social Musician on phone: Not on file    Gets together: Not on file    Attends religious service: Not on file  Active member of club or organization: Not on file    Attends meetings of clubs or organizations: Not on file    Relationship status: Not on file  Other Topics Concern  . Not on file  Social History Narrative   [place] lives in [place] with his mother; he stated that he works and is followed by Augusto GarbeJohn Cliff, NP with [facility].   Additional Social History:                         Sleep: Good  Appetite:  Good  Current Medications: Current Facility-Administered Medications  Medication Dose Route Frequency Provider Last Rate Last Dose  . acetaminophen (TYLENOL) tablet 650 mg  650 mg Oral Q6H PRN Malvin JohnsFarah, Carlyon Nolasco, MD   650 mg at 06/04/19 1701  . alum & mag hydroxide-simeth (MAALOX/MYLANTA) 200-200-20 MG/5ML suspension 30 mL  30 mL Oral Q4H PRN Malvin JohnsFarah, Zaahir Pickney, MD   30 mL at 06/05/19 0827  . benztropine (COGENTIN) tablet 0.5 mg  0.5  mg Oral BID Malvin JohnsFarah, Clovia Reine, MD   0.5 mg at 06/05/19 0825  . cloNIDine (CATAPRES) tablet 0.1 mg  0.1 mg Oral TID PRN Malvin JohnsFarah, Regis Wiland, MD      . hydrOXYzine (ATARAX/VISTARIL) tablet 50 mg  50 mg Oral TID PRN Malvin JohnsFarah, Artist Bloom, MD   50 mg at 06/01/19 2323  . lamoTRIgine (LAMICTAL) tablet 50 mg  50 mg Oral BID Malvin JohnsFarah, Domini Vandehei, MD   50 mg at 06/05/19 0825  . magnesium hydroxide (MILK OF MAGNESIA) suspension 30 mL  30 mL Oral Daily PRN Malvin JohnsFarah, Camillia Marcy, MD      . omega-3 acid ethyl esters (LOVAZA) capsule 1 g  1 g Oral BID Malvin JohnsFarah, Britteney Ayotte, MD   1 g at 06/05/19 0825  . risperiDONE (RISPERDAL) tablet 2 mg  2 mg Oral BID Malvin JohnsFarah, Emran Molzahn, MD   2 mg at 06/05/19 0825  . temazepam (RESTORIL) capsule 30 mg  30 mg Oral QHS Malvin JohnsFarah, Demoni Gergen, MD   30 mg at 06/04/19 2257    Lab Results: No results found for this or any previous visit (from the past 48 hour(s)).  Blood Alcohol level:  Lab Results  Component Value Date   ETH <10 05/30/2019   ETH <5 09/12/2016    Metabolic Disorder Labs: Lab Results  Component Value Date   HGBA1C 5.3 09/18/2016   MPG 105 09/18/2016   No results found for: PROLACTIN Lab Results  Component Value Date   CHOL 109 09/18/2016   TRIG 95 09/18/2016   HDL 43 09/18/2016   CHOLHDL 2.5 09/18/2016   VLDL 19 09/18/2016   LDLCALC 47 09/18/2016   LDLCALC 79 01/16/2015    Physical Findings: AIMS: Facial and Oral Movements Muscles of Facial Expression: None, normal Lips and Perioral Area: None, normal Jaw: None, normal Tongue: None, normal,Extremity Movements Upper (arms, wrists, hands, fingers): None, normal Lower (legs, knees, ankles, toes): None, normal, Trunk Movements Neck, shoulders, hips: None, normal, Overall Severity Severity of abnormal movements (highest score from questions above): None, normal Incapacitation due to abnormal movements: None, normal Patient's awareness of abnormal movements (rate only patient's report): No Awareness, Dental Status Current problems with teeth and/or  dentures?: No Does patient usually wear dentures?: No  CIWA:  CIWA-Ar Total: 1 COWS:  COWS Total Score: 2  Musculoskeletal: Strength & Muscle Tone: within normal limits Gait & Station: normal Patient leans: N/A  Psychiatric Specialty Exam: Physical Exam  ROS  Blood pressure 122/83, pulse 90, temperature 98.2  F (36.8 C), temperature source Oral, resp. rate 18, height 6\' 3"  (1.905 m), weight 66.7 kg, SpO2 100 %.Body mass index is 18.37 kg/m.  General Appearance: Casual  Eye Contact:  Good  Speech:  Clear and Coherent  Volume:  Normal  Mood:  Euthymic  Affect: nl  Thought Process:  Goal Directed  Orientation:  Full (Time, Place, and Person)  Thought Content:  Logical and With a generally intact associations  Suicidal Thoughts:  No  Homicidal Thoughts:  neg  Memory:  Immediate;   Fair Recent;   Fair Remote;   Good  Judgement:  Good  Insight:  good  Psychomotor Activity:  Normal  Concentration:  Concentration: Good and Attention Span: Good  Recall:  Good  Fund of Knowledge:  Good  Language:  Good  Akathisia:  Negative  Handed:  Right  AIMS (if indicated):     Assets:  Communication Skills Desire for Improvement Financial Resources/Insurance Leisure Time Physical Health Resilience Social Support  ADL's:  Intact  Cognition:  WNL  Sleep:  Number of Hours: 4.75     Treatment Plan Summary: Daily contact with patient to assess and evaluate symptoms and progress in treatment and Medication management  Continue 15-minute precautions Continue Lamictal Continue Risperdal Probable discharge tomorrow no change in precautions  Dinh Ayotte, MD 06/05/2019, 10:01 AM

## 2019-06-06 MED ORDER — LAMOTRIGINE 25 MG PO TABS: 50.0000 mg | ORAL_TABLET | Freq: Two times a day (BID) | ORAL | 2 refills | Status: DC

## 2019-06-06 MED ORDER — BENZTROPINE MESYLATE 0.5 MG PO TABS: 0.5000 mg | ORAL_TABLET | Freq: Two times a day (BID) | ORAL | 3 refills | Status: DC

## 2019-06-06 MED ORDER — TRAZODONE HCL 300 MG PO TABS: 300.0000 mg | ORAL_TABLET | Freq: Every evening | ORAL | 2 refills | Status: DC | PRN

## 2019-06-06 MED ORDER — TEMAZEPAM 30 MG PO CAPS: 30.0000 mg | ORAL_CAPSULE | Freq: Every day | ORAL | 0 refills | Status: DC

## 2019-06-06 MED ORDER — RISPERIDONE 4 MG PO TABS: 4.0000 mg | ORAL_TABLET | Freq: Every day | ORAL | 1 refills | Status: DC
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
## 2019-06-06 NOTE — Plan of Care (Signed)
Problem: Education: Goal: Ability to state activities that reduce stress will improve 06/06/2019 1249 by Jolene Provost, RN Outcome: Adequate for Discharge 06/06/2019 1221 by Jolene Provost, RN Outcome: Progressing Note: Patient voices no anxiety this morning   Problem: Coping: Goal: Ability to identify and develop effective coping behavior will improve Outcome: Adequate for Discharge   Problem: Self-Concept: Goal: Ability to identify factors that promote anxiety will improve Outcome: Adequate for Discharge Goal: Level of anxiety will decrease Outcome: Adequate for Discharge Goal: Ability to modify response to factors that promote anxiety will improve Outcome: Adequate for Discharge   Problem: Education: Goal: Knowledge of [facility] Education information/materials will improve Outcome: Adequate for Discharge Goal: Emotional status will improve Outcome: Adequate for Discharge Goal: Mental status will improve Outcome: Adequate for Discharge Goal: Verbalization of understanding the information provided will improve Outcome: Adequate for Discharge   Problem: Activity: Goal: Interest or engagement in activities will improve Outcome: Adequate for Discharge Goal: Sleeping patterns will improve 06/06/2019 1249 by Jolene Provost, RN Outcome: Adequate for Discharge 06/06/2019 1221 by Jolene Provost, RN Outcome: Progressing Note: Patent anxious about discharge states he could not sleep   Problem: Coping: Goal: Ability to verbalize frustrations and anger appropriately will improve Outcome: Adequate for Discharge Goal: Ability to demonstrate self-control will improve 06/06/2019 1249 by Jolene Provost, RN Outcome: Adequate for Discharge 06/06/2019 1221 by Jolene Provost, RN Outcome: Progressing Note: Patient behavior is appropriate on the unit   Problem: Health Behavior/Discharge Planning: Goal: Identification of resources available to assist in meeting health  care needs will improve Outcome: Adequate for Discharge Goal: Compliance with treatment plan for underlying cause of condition will improve Outcome: Adequate for Discharge   Problem: Physical Regulation: Goal: Ability to maintain clinical measurements within normal limits will improve Outcome: Adequate for Discharge   Problem: Safety: Goal: Periods of time without injury will increase 06/06/2019 1249 by Jolene Provost, RN Outcome: Adequate for Discharge 06/06/2019 1221 by Jolene Provost, RN Outcome: Progressing Note: Patient denies SI and remains safe on unit.   Problem: Education: Goal: Utilization of techniques to improve thought processes will improve Outcome: Adequate for Discharge Goal: Knowledge of the prescribed therapeutic regimen will improve Outcome: Adequate for Discharge   Problem: Activity: Goal: Interest or engagement in leisure activities will improve Outcome: Adequate for Discharge Goal: Imbalance in normal sleep/wake cycle will improve Outcome: Adequate for Discharge   Problem: Coping: Goal: Coping ability will improve Outcome: Adequate for Discharge Goal: Will verbalize feelings Outcome: Adequate for Discharge   Problem: Health Behavior/Discharge Planning: Goal: Ability to make decisions will improve Outcome: Adequate for Discharge Goal: Compliance with therapeutic regimen will improve 06/06/2019 1249 by Jolene Provost, RN Outcome: Adequate for Discharge 06/06/2019 1221 by Jolene Provost, RN Outcome: Progressing Note: Patient is compliant with medications   Problem: Role Relationship: Goal: Will demonstrate positive changes in social behaviors and relationships Outcome: Adequate for Discharge   Problem: Safety: Goal: Ability to disclose and discuss suicidal ideas will improve Outcome: Adequate for Discharge Goal: Ability to identify and utilize support systems that promote safety will improve Outcome: Adequate for Discharge   Problem:  Self-Concept: Goal: Will verbalize positive feelings about self Outcome: Adequate for Discharge Goal: Level of anxiety will decrease Outcome: Adequate for Discharge   Problem: Activity: Goal: Will identify at least one activity in which they can participate Outcome: Adequate for Discharge   Problem: Coping: Goal: Ability to identify and develop effective coping  behavior will improve Outcome: Adequate for Discharge Goal: Ability to interact with others will improve Outcome: Adequate for Discharge Goal: Demonstration of participation in decision-making regarding own care will improve Outcome: Adequate for Discharge Goal: Ability to use eye contact when communicating with others will improve Outcome: Adequate for Discharge   Problem: Health Behavior/Discharge Planning: Goal: Identification of resources available to assist in meeting health care needs will improve Outcome: Adequate for Discharge   Problem: Self-Concept: Goal: Will verbalize positive feelings about self Outcome: Adequate for Discharge   Problem: Activity: Goal: Will verbalize the importance of balancing activity with adequate rest periods Outcome: Adequate for Discharge   Problem: Education: Goal: Will be free of psychotic symptoms Outcome: Adequate for Discharge Goal: Knowledge of the prescribed therapeutic regimen will improve Outcome: Adequate for Discharge   Problem: Coping: Goal: Coping ability will improve Outcome: Adequate for Discharge Goal: Will verbalize feelings Outcome: Adequate for Discharge   Problem: Health Behavior/Discharge Planning: Goal: Compliance with prescribed medication regimen will improve Outcome: Adequate for Discharge   Problem: Nutritional: Goal: Ability to achieve adequate nutritional intake will improve Outcome: Adequate for Discharge   Problem: Role Relationship: Goal: Ability to communicate needs accurately will improve Outcome: Adequate for Discharge Goal: Ability to  interact with others will improve Outcome: Adequate for Discharge   Problem: Safety: Goal: Ability to redirect hostility and anger into socially appropriate behaviors will improve Outcome: Adequate for Discharge Goal: Ability to remain free from injury will improve 06/06/2019 1249 by Curly RimBaker, Kamali Sakata B, RN Outcome: Adequate for Discharge 06/06/2019 1221 by Curly RimBaker, Sherre Wooton B, RN Outcome: Progressing Note: Patient denies SI and remains safe on unit   Problem: Self-Care: Goal: Ability to participate in self-care as condition permits will improve Outcome: Adequate for Discharge   Problem: Self-Concept: Goal: Will verbalize positive feelings about self Outcome: Adequate for Discharge   Problem: Education: Goal: Ability to make informed decisions regarding treatment will improve Outcome: Adequate for Discharge   Problem: Coping: Goal: Coping ability will improve Outcome: Adequate for Discharge   Problem: Health Behavior/Discharge Planning: Goal: Identification of resources available to assist in meeting health care needs will improve Outcome: Adequate for Discharge   Problem: Medication: Goal: Compliance with prescribed medication regimen will improve Outcome: Adequate for Discharge   Problem: Self-Concept: Goal: Ability to disclose and discuss suicidal ideas will improve Outcome: Adequate for Discharge Goal: Will verbalize positive feelings about self Outcome: Adequate for Discharge

## 2019-06-06 NOTE — Progress Notes (Signed)
Nurse Discharge Note:  D:Patient denies SI/HI at this time. Pt appears calm and cooperative, and no distress noted. AVS/Follow-Up appointments/Prescriptions reviewed with patient and written copies given to patient and he verbalized understanding. Patient alert, oriented and ambulatory at discharge. Patient denies pain.   A: All Personal items in locker returned to patient and he verified receipt. Pt escorted out of the building by staff to meet mom.   R:  Pt States she will comply with outpatient services, and take medications  as prescribed.

## 2019-06-06 NOTE — Progress Notes (Signed)
  [facility] Adult Case Management Discharge Plan :  Will you be returning to the same living situation after discharge:  Yes,  returning to live with a parent. At discharge, do you have transportation home?: Yes,  dad is picking patient up. Do you have the ability to pay for your medications: Yes,  reports he does.  Release of information consent forms completed and in the chart;  Patient's signature needed at discharge.  Patient to Follow up at: [place] on 06/07/2019.   Why: Medication management with Eligah East 9/17 at 2:00p.  Please wear a mask and bring your medications.  Contact information: [place] [address] [phone]           Next level of care provider has access to [place] and Suicide Prevention discussed: Yes,  completed with mother Jerrye Beavers previously.      Has patient been referred to the Quitline?: N/A patient is not a smoker  Patient has been referred for addiction treatment: Pt denied needing.  Tye Savoy, LCSW 06/06/2019, 10:52 AM

## 2019-06-06 NOTE — [facility] Suicide Risk Assessment (Signed)
[facility] Discharge Suicide Risk Assessment   Principal Problem: Bipolar I disorder, current or most recent episode manic, with psychotic features [facility]) Discharge Diagnoses: Principal Problem:   Bipolar I disorder, current or most recent episode manic, with psychotic features ([place]) Active Problems:   Cannabis use disorder, moderate, in controlled environment ([place])   Total Time spent with patient: 45 minutes  Musculoskeletal: Strength & Muscle Tone: within normal limits Gait & Station: normal Patient leans: N/A  Psychiatric Specialty Exam: ROS  Blood pressure 124/76, pulse 79, temperature 98.6 F (37 C), temperature source Oral, resp. rate 18, height 6\' 3"  (1.905 m), weight 66.7 kg, SpO2 100 %.Body mass index is 18.37 kg/m.  General Appearance: Casual  Eye Contact::  Good  Speech:  Clear and Coherent409  Volume:  Normal  Mood:  Euthymic  Affect:  Congruent  Thought Process:  Coherent  Orientation:  Full (Time, Place, and Person)  Thought Content:  Logical  Suicidal Thoughts:  No  Homicidal Thoughts:  No  Memory:  Immediate;   Fair Recent;   Good Remote;   Good  Judgement:  Good  Insight:  Good  Psychomotor Activity:  Normal  Concentration:  Fair  Recall:  Good  Fund of Knowledge:Good  Language: Good  Akathisia:  Negative  Handed:  Right  AIMS (if indicated):     Assets:  Communication Skills Desire for Improvement  Sleep:  Number of Hours: 1.75  Cognition: WNL  ADL's:  Intact   Mental Status Per Nursing Assessment::   On Admission:  NA  Demographic Factors:  Male  Loss Factors: NA  Historical Factors: NA  Risk Reduction Factors:   Positive social support and Positive therapeutic relationship  Continued Clinical Symptoms:  Previous Psychiatric Diagnoses and Treatments  Cognitive Features That Contribute To Risk:  None    Suicide Risk:  Minimal: No identifiable suicidal ideation.  Patients presenting with no risk factors but with morbid ruminations;  may be classified as minimal risk based on the severity of the depressive symptoms    Plan Of Care/Follow-up recommendations:  Activity:  full  Kitara Hebb, MD 06/06/2019, 8:02 AM

## 2019-06-06 NOTE — Plan of Care (Signed)
D: Pt denies SI/HI/AV hallucinations. Pt is pleasant and cooperative. Pt goal for today is to work on getting home as soon as possible. A: Pt was offered support and encouragement. Pt was given scheduled medications. Pt was encourage to attend groups. Q 15 minute checks were done for safety.  R: Pt is taking medication. Pt has no complaints.Pt receptive to treatment and safety maintained on unit.    Problem: Education: Goal: Ability to state activities that reduce stress will improve Outcome: Progressing Note: Patient voices no anxiety this morning   Problem: Activity: Goal: Sleeping patterns will improve Outcome: Progressing Note: Patent anxious about discharge states he could not sleep   Problem: Coping: Goal: Ability to demonstrate self-control will improve Outcome: Progressing Note: Patient behavior is appropriate on the unit   Problem: Safety: Goal: Periods of time without injury will increase Outcome: Progressing Note: Patient denies SI and remains safe on unit.   Problem: Health Behavior/Discharge Planning: Goal: Compliance with therapeutic regimen will improve Outcome: Progressing Note: Patient is compliant with medications   Problem: Safety: Goal: Ability to remain free from injury will improve Outcome: Progressing Note: Patient denies SI and remains safe on unit

## 2019-07-05 ENCOUNTER — Other Ambulatory Visit: Payer: Self-pay

## 2019-07-05 ENCOUNTER — Encounter (HOSPITAL_COMMUNITY): Payer: Self-pay

## 2019-07-05 ENCOUNTER — Emergency Department (HOSPITAL_COMMUNITY)
Admission: EM | Admit: 2019-07-05 | Discharge: 2019-07-09 | Disposition: A | Discharge status: Other Acute Inpatient Hospital | Payer: Self-pay | Attending: Emergency Medicine | Admitting: Emergency Medicine

## 2019-07-05 DIAGNOSIS — F19959 Other psychoactive substance use, unspecified with psychoactive substance-induced psychotic disorder, unspecified: Secondary | ICD-10-CM

## 2019-07-05 DIAGNOSIS — F312 Bipolar disorder, current episode manic severe with psychotic features: Secondary | ICD-10-CM | POA: Diagnosis present

## 2019-07-05 DIAGNOSIS — F3112 Bipolar disorder, current episode manic without psychotic features, moderate: Secondary | ICD-10-CM

## 2019-07-05 DIAGNOSIS — F1210 Cannabis abuse, uncomplicated: Secondary | ICD-10-CM

## 2019-07-05 DIAGNOSIS — Z20828 Contact with and (suspected) exposure to other viral communicable diseases: Secondary | ICD-10-CM | POA: Insufficient documentation

## 2019-07-05 DIAGNOSIS — F3164 Bipolar disorder, current episode mixed, severe, with psychotic features: Secondary | ICD-10-CM | POA: Insufficient documentation

## 2019-07-05 DIAGNOSIS — B029 Zoster without complications: Secondary | ICD-10-CM | POA: Insufficient documentation

## 2019-07-05 DIAGNOSIS — F1220 Cannabis dependence, uncomplicated: Secondary | ICD-10-CM | POA: Diagnosis present

## 2019-07-05 DIAGNOSIS — Z79899 Other long term (current) drug therapy: Secondary | ICD-10-CM | POA: Insufficient documentation

## 2019-07-05 NOTE — ED Triage Notes (Signed)
Pt coming from home c/o rash under right arm pit after using dead sea scrub and would like be evaluated for blood clots due to hx.   Pt's dad at bedside requesting a mental health eval due to concerning facebook posts. Pt has been seen at [facility] in the past.   Pt reports he is not SI, HI or hearing/seein anything right now.

## 2019-07-06 LAB — RAPID URINE DRUG SCREEN, HOSP PERFORMED
Amphetamines: POSITIVE — AB
Barbiturates: NOT DETECTED
Benzodiazepines: NOT DETECTED
Cocaine: NOT DETECTED
Opiates: NOT DETECTED
Tetrahydrocannabinol: POSITIVE — AB

## 2019-07-06 LAB — BASIC METABOLIC PANEL
Anion gap: 11 (ref 5–15)
BUN: 20 mg/dL (ref 6–20)
CO2: 26 mmol/L (ref 22–32)
Calcium: 9.7 mg/dL (ref 8.9–10.3)
Chloride: 103 mmol/L (ref 98–111)
Creatinine, Ser: 0.96 mg/dL (ref 0.61–1.24)
GFR calc Af Amer: >60 mL/min (ref >60)
GFR calc non Af Amer: >60 mL/min (ref >60)
Glucose, Bld: 102 mg/dL — ABNORMAL HIGH (ref 70–99)
Potassium: 3.8 mmol/L (ref 3.5–5.1)
Sodium: 140 mmol/L (ref 135–145)

## 2019-07-06 LAB — CBC WITH DIFFERENTIAL/PLATELET
Abs Immature Granulocytes: 0.01 10*3/uL (ref 0.00–0.07)
Basophils Absolute: 0 10*3/uL (ref 0.0–0.1)
Basophils Relative: 1 %
Eosinophils Absolute: 0.2 10*3/uL (ref 0.0–0.5)
Eosinophils Relative: 4 %
HCT: 48.2 % (ref 39.0–52.0)
Hemoglobin: 15.7 g/dL (ref 13.0–17.0)
Immature Granulocytes: 0 %
Lymphocytes Relative: 41 %
Lymphs Abs: 2.4 10*3/uL (ref 0.7–4.0)
MCH: 29.7 pg (ref 26.0–34.0)
MCHC: 32.6 g/dL (ref 30.0–36.0)
MCV: 91.1 fL (ref 80.0–100.0)
Monocytes Absolute: 0.5 10*3/uL (ref 0.1–1.0)
Monocytes Relative: 9 %
Neutro Abs: 2.7 10*3/uL (ref 1.7–7.7)
Neutrophils Relative %: 45 %
Platelets: 285 10*3/uL (ref 150–400)
RBC: 5.29 MIL/uL (ref 4.22–5.81)
RDW: 12.9 % (ref 11.5–15.5)
WBC: 5.9 10*3/uL (ref 4.0–10.5)
nRBC: 0 % (ref 0.0–0.2)

## 2019-07-06 LAB — ETHANOL: Alcohol, Ethyl (B): <10 mg/dL (ref <10)

## 2019-07-06 LAB — SARS CORONAVIRUS 2 (TAT 6-24 HRS): SARS Coronavirus 2: NEGATIVE

## 2019-07-06 MED ORDER — LAMOTRIGINE 25 MG PO TABS
50.0000 mg | ORAL_TABLET | Freq: Two times a day (BID) | ORAL | Status: DC
  Administered 2019-07-06 – 2019-07-09 (×2): 50 mg via ORAL
  Filled 2019-07-06 (×3): qty 2

## 2019-07-06 MED ORDER — VALACYCLOVIR HCL 500 MG PO TABS
1000.0000 mg | ORAL_TABLET | Freq: Three times a day (TID) | ORAL | Status: DC
  Administered 2019-07-06 – 2019-07-09 (×10): 1000 mg via ORAL
  Filled 2019-07-06 (×9): qty 2

## 2019-07-06 MED ORDER — RISPERIDONE 2 MG PO TABS
3.0000 mg | ORAL_TABLET | Freq: Every day | ORAL | Status: DC
  Filled 2019-07-06 (×3): qty 1

## 2019-07-06 MED ORDER — NICOTINE 21 MG/24HR TD PT24: 21.0000 mg | MEDICATED_PATCH | Freq: Every day | TRANSDERMAL | Status: DC

## 2019-07-06 MED ORDER — ACETAMINOPHEN 325 MG PO TABS
650.0000 mg | ORAL_TABLET | Freq: Four times a day (QID) | ORAL | Status: DC | PRN
  Administered 2019-07-07 – 2019-07-08 (×3): 650 mg via ORAL
  Filled 2019-07-06 (×3): qty 2

## 2019-07-06 MED ORDER — VALACYCLOVIR HCL 500 MG PO TABS
1000.0000 mg | ORAL_TABLET | Freq: Once | ORAL | Status: AC
  Administered 2019-07-06: 1000 mg via ORAL
  Filled 2019-07-06: qty 2

## 2019-07-06 MED ORDER — TRAZODONE HCL 100 MG PO TABS
100.0000 mg | ORAL_TABLET | Freq: Every day | ORAL | Status: DC
  Filled 2019-07-06 (×2): qty 1

## 2019-07-06 MED ORDER — BENZTROPINE MESYLATE 0.5 MG PO TABS
0.5000 mg | ORAL_TABLET | Freq: Two times a day (BID) | ORAL | Status: DC
  Administered 2019-07-06: 0.5 mg via ORAL
  Filled 2019-07-06 (×4): qty 1

## 2019-07-06 MED ORDER — ONDANSETRON HCL 4 MG PO TABS: 4.0000 mg | ORAL_TABLET | Freq: Three times a day (TID) | ORAL | Status: DC | PRN

## 2019-07-06 NOTE — ED Notes (Signed)
Pt changed into burgundy scrubs and yellow socks. All belongings taken from pt and placed in a labeled bag and placed at nurses station

## 2019-07-06 NOTE — ED Notes (Signed)
Pt has been cooperative, redirectable, staying in room.

## 2019-07-06 NOTE — ED Notes (Signed)
Per pt the rash under his arm was noted on Tuesday .  Pt was been treated with Valtrex with first dose this AM.
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
## 2019-07-06 NOTE — ED Provider Notes (Signed)
Clinical Course as of Jul 05 1805  Fri Jul 06, 2019  0732 Pt signed out to me by Dr. Donaciano Eva.  Briefly 28 yo male w/ bipolar disorder here with flight of ideas, father reporting patient's bipolar is "acting up."  Also has right arm rash that may be shingles, received valtrex.  Pending [facility] evaluation.  Will order valtrex for TID dosing and place patient on airborne/contact precautions   [MT]  0830 Patient has a rash in his right armpit which is consistent with herpes zoster.  He reports this rash began approximately 4 days ago after she tried shaving his armpit with a "rough sea stone."  He says that the rash does burn and is irritating.  He does have a history of zoster in the past in his right lower back.  On exam the rash is located diffusely in the right axilla, vesicular lesions in various states of healing.  There are blisters that are open.  Advised nursing to put him on airborne precautions.  We will continue Valtrex.  I do not have concern for disseminated Zoster. We are awaiting behavioral health screen.  The patient otherwise has no complaints and is comfortably eating breakfast.   [MT]    Clinical Course User Index [MT] Aasiyah Auerbach, Carola Rhine, MD     Wyvonnia Dusky, MD 07/06/19 [phone]

## 2019-07-06 NOTE — ED Notes (Signed)
Pt stated vegetarian.

## 2019-07-06 NOTE — ED Provider Notes (Signed)
[facility] DEPT Provider Note: Georgena Spurling, MD, FACEP  CSN: [id number] MRN: [id number] ARRIVAL: 07/05/19 at [place]: [place] Problem and Rash  Level 5 caveat: Altered mental status HISTORY OF PRESENT ILLNESS  07/06/19 4:57 AM Isaiah Garza is a 28 y.o. male with history of bipolar disorder.  His father brought him in because his behavior has become altered over the past several days.  His speech is rambling and incoherent.  He has not been violent and denies suicidality or homicidality.  He has reportedly been posting things on Facebook that are concerning his father.  The post that the patient shows me appear nonsensical but include concerns about crying uncontrollably.  He has reportedly been using cannabis recently.  He is also complaining of a mildly painful rash in his right axilla.  He attributes this to using a dead sea scrub.  The rash is vesicular and has been present for 3 days.  He has a history of shingles in the past.  He has complaints of generalized joint pain and describes it as feeling like his joints are all popping in and out of joint.  He complains of central chest pain and shortness of breath.  When asked the timing of these symptoms he states they have been present for "a long time".   Past Medical History:  Diagnosis Date  . ADD (attention deficit disorder)   . ADHD (attention deficit hyperactivity disorder)   . Allergy   . Anxiety   . Arthritis    neck  . Asthma   . Bipolar 1 disorder ([place])   . Depression   . Pulmonary embolism ([place]) 10/2016    Past Surgical History:  Procedure Laterality Date  . NO PAST SURGERIES      Family History  Problem Relation Age of Onset  . Cancer Father        testicular  . ADD / ADHD Father   . Depression Father   . Alcohol abuse Mother   . Depression Mother   . Anxiety disorder Sister   . Bipolar disorder Maternal Grandmother   . Dementia Maternal Grandmother   .  Bipolar disorder Maternal Aunt   . Bipolar disorder Cousin   . Diabetes Neg Hx   . Heart disease Neg Hx   . Early death Neg Hx   . Stroke Neg Hx     Social History   Tobacco Use  . Smoking status: Never Smoker  . Smokeless tobacco: Never Used  Substance Use Topics  . Alcohol use: Not Currently    Alcohol/week: 0.0 standard drinks    Comment: none at present  . Drug use: Yes    Types: Marijuana    Prior to Admission medications   Medication Sig Start Date End Date Taking? Authorizing Provider  amphetamine-dextroamphetamine (ADDERALL) 20 MG tablet Take 20 mg by mouth 3 (three) times daily. 06/12/19  Yes [provider]  cariprazine (VRAYLAR) capsule Take 3 mg by mouth daily.   Yes [provider]  Glucosamine-Chondroit-Vit C-Mn (GLUCOSAMINE 1500 COMPLEX PO) Take 1 tablet by mouth daily.   Yes [provider]  omega-3 acid ethyl esters (LOVAZA) 1 g capsule Take 1 g by mouth daily.   Yes [provider]  OVER THE COUNTER MEDICATION Take 5-15 mLs by mouth daily as needed. Kratum   Yes [provider]  lamoTRIgine (LAMICTAL) 25 MG tablet Take 2 tablets (50 mg total) by mouth 2 (two) times daily.  06/06/19   Malvin JohnsFarah, Brian, MD  benztropine (COGENTIN) 0.5 MG tablet Take 1 tablet (0.5 mg total) by mouth 2 (two) times daily. Patient not taking: Reported on 07/06/2019 06/06/19 07/06/19  Malvin JohnsFarah, Brian, MD  risperiDONE (RISPERDAL) 4 MG tablet Take 1 tablet (4 mg total) by mouth at bedtime. Patient not taking: Reported on 07/06/2019 06/06/19 07/06/19  Malvin JohnsFarah, Brian, MD  temazepam (RESTORIL) 30 MG capsule Take 1 capsule (30 mg total) by mouth at bedtime. Patient not taking: Reported on 07/06/2019 06/06/19 07/06/19  Malvin JohnsFarah, Brian, MD  trazodone (DESYREL) 300 MG tablet Take 1 tablet (300 mg total) by mouth at bedtime as needed for sleep. Patient not taking: Reported on 07/06/2019 06/06/19 07/06/19  Malvin JohnsFarah, Brian, MD    Allergies Patient has no known allergies.    REVIEW OF SYSTEMS     PHYSICAL EXAMINATION  Initial Vital Signs Blood pressure 130/81, pulse 81, temperature 98.5 F (36.9 C), temperature source Oral, resp. rate 18, height 6\' 3"  (1.905 m), weight 72.6 kg, SpO2 100 %.  Examination General: Well-developed, well-nourished male in no acute distress; appearance consistent with age of record HENT: normocephalic; atraumatic Eyes: pupils equal, round and reactive to light; extraocular muscles intact Neck: supple Heart: regular rate and rhythm Lungs: clear to auscultation bilaterally Abdomen: soft; nondistended; nontender; no masses or hepatosplenomegaly; bowel sounds present Extremities: No deformity; full range of motion; pulses normal Neurologic: Awake, alert; motor function intact in all extremities and symmetric; no facial droop Skin: Warm and dry; vesicular rash of right axilla:    Psychiatric: No HI; no SI; rambling, incoherent speech; pleasant, affable mood with congruent affect   RESULTS  Summary of this visit's results, reviewed by myself:   EKG Interpretation  Date/Time:    Ventricular Rate:    PR Interval:    QRS Duration:   QT Interval:    QTC Calculation:   R Axis:     Text Interpretation:        Laboratory Studies: Results for orders placed or performed during the hospital encounter of 07/05/19 (from the past 24 hour(s))  CBC with Differential/Platelet     Status: None   Collection Time: 07/06/19  5:03 AM  Result Value Ref Range   WBC 5.9 4.0 - 10.5 K/uL   RBC 5.29 4.22 - 5.81 MIL/uL   Hemoglobin 15.7 13.0 - 17.0 g/dL   HCT 09.848.2 11.939.0 - 14.752.0 %   MCV 91.1 80.0 - 100.0 fL   MCH 29.7 26.0 - 34.0 pg   MCHC 32.6 30.0 - 36.0 g/dL   RDW 82.912.9 56.211.5 - 13.015.5 %   Platelets 285 150 - 400 K/uL   nRBC 0.0 0.0 - 0.2 %   Neutrophils Relative % 45 %   Neutro Abs 2.7 1.7 - 7.7 K/uL   Lymphocytes Relative 41 %   Lymphs Abs 2.4 0.7 - 4.0 K/uL   Monocytes Relative 9 %   Monocytes Absolute 0.5 0.1 - 1.0 K/uL    Eosinophils Relative 4 %   Eosinophils Absolute 0.2 0.0 - 0.5 K/uL   Basophils Relative 1 %   Basophils Absolute 0.0 0.0 - 0.1 K/uL   Immature Granulocytes 0 %   Abs Immature Granulocytes 0.01 0.00 - 0.07 K/uL  Basic metabolic panel     Status: Abnormal   Collection Time: 07/06/19  5:03 AM  Result Value Ref Range   Sodium 140 135 - 145 mmol/L   Potassium 3.8 3.5 - 5.1 mmol/L   Chloride 103 98 - 111 mmol/L  CO2 26 22 - 32 mmol/L   Glucose, Bld 102 (H) 70 - 99 mg/dL   BUN 20 6 - 20 mg/dL   Creatinine, Ser 9.38 0.61 - 1.24 mg/dL   Calcium 9.7 8.9 - 18.2 mg/dL   GFR calc non Af Amer >60 >60 mL/min   GFR calc Af Amer >60 >60 mL/min   Anion gap 11 5 - 15  Ethanol     Status: None   Collection Time: 07/06/19  5:03 AM  Result Value Ref Range   Alcohol, Ethyl (B) <10 <10 mg/dL  Rapid urine drug screen (hospital performed)     Status: Abnormal   Collection Time: 07/06/19  5:03 AM  Result Value Ref Range   Opiates NONE DETECTED NONE DETECTED   Cocaine NONE DETECTED NONE DETECTED   Benzodiazepines NONE DETECTED NONE DETECTED   Amphetamines POSITIVE (A) NONE DETECTED   Tetrahydrocannabinol POSITIVE (A) NONE DETECTED   Barbiturates NONE DETECTED NONE DETECTED   Imaging Studies: No results found.  ED COURSE and MDM  Nursing notes and initial vitals signs, including pulse oximetry, reviewed.  Vitals:   07/05/19 2218  BP: 130/81  Pulse: 81  Resp: 18  Temp: 98.5 F (36.9 C)  TempSrc: Oral  SpO2: 100%  Weight: 72.6 kg  Height: 6\' 3"  (1.905 m)   5:00 AM Patient started on Valtrex for axillary rash consistent with shingles.  6:46 AM TTS consult pending.  PROCEDURES    ED DIAGNOSES     ICD-10-CM   1. Bipolar 1 disorder with moderate mania (HCC)  F31.12   2. Herpes zoster without complication  B02.9   3. Cannabis abuse  F12.10        Shellia Hartl, , MD 07/06/19 [phone]

## 2019-07-06 NOTE — Progress Notes (Signed)
Received Isaiah Garza  shift change awake in his room with the sitter at the bedside. He took a shower and repeated the behavior when he was feeling anxious. Limits were set, last shower for the night. He refused his medications, stating he does not need them and they are poison. He requested to go home. The IVC status was explained to him. He is disorganized and unable to understand the IVC precess. He wrote two notes to the doctor, they were placed in his chart. He drifted off to sleep at 0000 hrs and slept throughout the night without incident.

## 2019-07-06 NOTE — BHH Counselor (Addendum)
Clinician attempted to calls pt's nurse to set up TTS cart however call rang then went to voicemail. Clinician to try again later.    Vertell Novak, MS, [facility], [facility] Triage Specialist [phone].

## 2019-07-06 NOTE — BHH Counselor (Signed)
Clinician attempted to calls pt's nurse to set up TTS cart however no one answered. Please call when pt is able to be assessed. (Contact information is below.)    Vertell Novak, [address], [facility], [facility] Triage Specialist [phone] or [phone].

## 2019-07-06 NOTE — Consult Note (Signed)
Telepsych Consultation   Reason for Consult:  "Altered behavior" Referring Physician:  EDP Location of Patient: [facility] Location of Provider: [facility]  Patient Identification: Isaiah Garza MRN:  [id number] Principal Diagnosis: Bipolar I disorder, current or most recent episode manic, with psychotic features ([place]) Diagnosis:  Principal Problem:   Bipolar I disorder, current or most recent episode manic, with psychotic features ([place]) Active Problems:   Cannabis use disorder, moderate, in controlled environment ([place])   Total Time spent with patient: 20 minutes  Subjective:   Isaiah Garza is a 28 y.o. male patient admitted with "altered behavior over several days." Patient alert and oriented, cooperative with assessment. Patient hyperverbal, speaks rapidly with loosening of associations. Patient verbalizes, "I am moving to [place], truth is stranger than fiction, I'm already free, it's not about me, green light, trust the light." Patient exhibits elevated/expansive mood, grandiosity and flight of ideas. Patient appears manic, history of Bipolar I Disorder.  Patient denies suicidal and homicidal ideations. Patient denies hallucinations, does not appear to be responding to internal stimuli.  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 Patient states he is presenting for "holes in his back that are very painful." Patient denies any current mental health symptoms and states his psychiatrist, "cleared him from all mental health issues a long time ago."   Past Psychiatric History: Bipolar I Disorder  Risk to Self: Suicidal Ideation: No Suicidal Intent: No Is patient at risk for suicide?: No, but patient needs Medical  Clearance Suicidal Plan?: No Access to Means: No What has been your use of drugs/alcohol within the last 12 months?: Hx of cannabis use How many times?: 0 Other Self Harm Risks: (Off medications) Triggers for Past Attempts: (NA) Intentional Self Injurious Behavior: None Risk to Others: Homicidal Ideation: No Thoughts of Harm to Others: No Current Homicidal Intent: No Current Homicidal Plan: No Access to Homicidal Means: No Identified Victim: NA History of harm to others?: No Assessment of Violence: None Noted Violent Behavior Description: NA Does patient have access to weapons?: No Criminal Charges Pending?: No Does patient have a court date: No Prior Inpatient Therapy: Prior Inpatient Therapy: Yes Prior Therapy Dates: 2020, 2019 Prior Therapy Facilty/Provider(s): [place], [facility] Reason for Treatment: MH issues Prior Outpatient Therapy: Prior Outpatient Therapy: Yes Prior Therapy Dates: Ongoing Prior Therapy Facilty/Provider(s): [facility] Reason for Treatment: Med mang Does patient have an ACCT team?: No Does patient have Intensive In-House Services?  : No Does patient have [facility] services? : No Does patient have P4CC services?: No  Past Medical History:  Past Medical History:  Diagnosis Date  . ADD (attention deficit disorder)   . ADHD (attention deficit hyperactivity disorder)   . Allergy   . Anxiety   . Arthritis    neck  . Asthma   . Bipolar 1 disorder ([place])   . Depression   . Pulmonary embolism ([place]) 10/2016    Past Surgical History:  Procedure Laterality Date  . NO PAST SURGERIES     Family History:  Family History  Problem Relation Age of Onset  . Cancer Father        testicular  . ADD / ADHD Father   . Depression Father   . Alcohol abuse  Mother   . Depression Mother   . Anxiety disorder Sister   . Bipolar disorder Maternal Grandmother   . Dementia Maternal Grandmother   . Bipolar disorder Maternal Aunt   . Bipolar disorder Cousin   .  Diabetes Neg Hx   . Heart disease Neg Hx   . Early death Neg Hx   . Stroke Neg Hx    Family Psychiatric  History: Unknown Social History:  Social History   Substance and Sexual Activity  Alcohol Use Not Currently  . Alcohol/week: 0.0 standard drinks   Comment: none at present     Social History   Substance and Sexual Activity  Drug Use Yes  . Types: Marijuana    Social History   Socioeconomic History  . Marital status: Single    Spouse name: Not on file  . Number of children: Not on file  . Years of education: Not on file  . Highest education level: Not on file  Occupational History  . Not on file  Social Needs  . Financial resource strain: Not on file  . Food insecurity    Worry: Not on file    Inability: Not on file  . Transportation needs    Medical: Not on file    Non-medical: Not on file  Tobacco Use  . Smoking status: Never Smoker  . Smokeless tobacco: Never Used  Substance and Sexual Activity  . Alcohol use: Not Currently    Alcohol/week: 0.0 standard drinks    Comment: none at present  . Drug use: Yes    Types: Marijuana  . Sexual activity: Not Currently    Partners: Female    Birth control/protection: Condom  Lifestyle  . Physical activity    Days per week: Not on file    Minutes per session: Not on file  . Stress: Not on file  Relationships  . Social Musician on phone: Not on file    Gets together: Not on file    Attends religious service: Not on file    Active member of club or organization: Not on file    Attends meetings of clubs or organizations: Not on file    Relationship status: Not on file  Other Topics Concern  . Not on file  Social History Narrative   Pt lives in [place] with his mother; he stated that he works and is followed by Augusto Garbe, NP with [facility].   Additional Social History:    Allergies:  No Known Allergies  Labs:  Results for orders placed or performed during the hospital encounter  of 07/05/19 (from the past 48 hour(s))  CBC with Differential/Platelet     Status: None   Collection Time: 07/06/19  5:03 AM  Result Value Ref Range   WBC 5.9 4.0 - 10.5 K/uL   RBC 5.29 4.22 - 5.81 MIL/uL   Hemoglobin 15.7 13.0 - 17.0 g/dL   HCT 16.1 09.6 - 04.5 %   MCV 91.1 80.0 - 100.0 fL   MCH 29.7 26.0 - 34.0 pg   MCHC 32.6 30.0 - 36.0 g/dL   RDW 40.9 81.1 - 91.4 %   Platelets 285 150 - 400 K/uL   nRBC 0.0 0.0 - 0.2 %   Neutrophils Relative % 45 %   Neutro Abs 2.7 1.7 - 7.7 K/uL   Lymphocytes Relative 41 %   Lymphs Abs 2.4 0.7 - 4.0 K/uL   Monocytes Relative 9 %   Monocytes Absolute 0.5  0.1 - 1.0 K/uL   Eosinophils Relative 4 %   Eosinophils Absolute 0.2 0.0 - 0.5 K/uL   Basophils Relative 1 %   Basophils Absolute 0.0 0.0 - 0.1 K/uL   Immature Granulocytes 0 %   Abs Immature Granulocytes 0.01 0.00 - 0.07 K/uL    Comment: Performed at [facility], [address] [place], [place] [address]  Basic metabolic panel     Status: Abnormal   Collection Time: 07/06/19  5:03 AM  Result Value Ref Range   Sodium 140 135 - 145 mmol/L   Potassium 3.8 3.5 - 5.1 mmol/L   Chloride 103 98 - 111 mmol/L   CO2 26 22 - 32 mmol/L   Glucose, Bld 102 (H) 70 - 99 mg/dL   BUN 20 6 - 20 mg/dL   Creatinine, Ser 6.040.96 0.61 - 1.24 mg/dL   Calcium 9.7 8.9 - 54.010.3 mg/dL   GFR calc non Af Amer >60 >60 mL/min   GFR calc Af Amer >60 >60 mL/min   Anion gap 11 5 - 15    Comment: Performed at [facility], [address] [place], [place] [address]  Ethanol     Status: None   Collection Time: 07/06/19  5:03 AM  Result Value Ref Range   Alcohol, Ethyl (B) <10 <10 mg/dL    Comment: (NOTE) Lowest detectable limit for serum alcohol is 10 mg/dL. For medical purposes only. Performed at [facility], [address] [place], [place] [address]   Rapid urine drug screen (hospital performed)     Status: Abnormal   Collection Time: 07/06/19  5:03 AM   Result Value Ref Range   Opiates NONE DETECTED NONE DETECTED   Cocaine NONE DETECTED NONE DETECTED   Benzodiazepines NONE DETECTED NONE DETECTED   Amphetamines POSITIVE (A) NONE DETECTED   Tetrahydrocannabinol POSITIVE (A) NONE DETECTED   Barbiturates NONE DETECTED NONE DETECTED    Comment: (NOTE) DRUG SCREEN FOR MEDICAL PURPOSES ONLY.  IF CONFIRMATION IS NEEDED FOR ANY PURPOSE, NOTIFY LAB WITHIN 5 DAYS. LOWEST DETECTABLE LIMITS FOR URINE DRUG SCREEN Drug Class                     Cutoff (ng/mL) Amphetamine and metabolites    1000 Barbiturate and metabolites    200 Benzodiazepine                 200 Tricyclics and metabolites     300 Opiates and metabolites        300 Cocaine and metabolites        300 THC                            50 Performed at [facility], [address] [place], [place] [address]     Medications:  Current Facility-Administered Medications  Medication Dose Route Frequency Provider Last Rate Last Dose  . benztropine (COGENTIN) tablet 0.5 mg  0.5 mg Oral BID Patrcia Dollyate, Shaquoya Cosper L, FNP   0.5 mg at 07/06/19 1121  . lamoTRIgine (LAMICTAL) tablet 50 mg  50 mg Oral BID Patrcia Dollyate, Quintan Saldivar L, FNP   50 mg at 07/06/19 1121  . ondansetron (ZOFRAN) tablet 4 mg  4 mg Oral Q8H PRN Molpus, John, MD      . risperiDONE (RISPERDAL) tablet 3 mg  3 mg Oral QHS Patrcia Dollyate, Terron Merfeld L, FNP      . traZODone (DESYREL) tablet  100 mg  100 mg Oral QHS Patrcia Dolly, FNP      . valACYclovir (VALTREX) tablet 1,000 mg  1,000 mg Oral TID Terald Sleeper, MD   1,000 mg at 07/06/19 1010   Current Outpatient Medications  Medication Sig Dispense Refill  . Glucosamine-Chondroit-Vit C-Mn (GLUCOSAMINE 1500 COMPLEX PO) Take 1 tablet by mouth daily.    Marland Kitchen omega-3 acid ethyl esters (LOVAZA) 1 g capsule Take 1 g by mouth daily.    Marland Kitchen OVER THE COUNTER MEDICATION Take 5-15 mLs by mouth daily as needed. Kratum    . lamoTRIgine (LAMICTAL) 25 MG tablet Take 2 tablets (50 mg total) by mouth 2 (two) times  daily. 60 tablet 2    Musculoskeletal: Strength & Muscle Tone: within normal limits Gait & Station: normal Patient leans: N/A  Psychiatric Specialty Exam: Physical Exam  Nursing note and vitals reviewed. Constitutional: He is oriented to person, place, and time. He appears well-developed.  HENT:  Head: Normocephalic.  Neck: Normal range of motion.  Cardiovascular: Normal rate.  Respiratory: Effort normal.  Musculoskeletal: Normal range of motion.  Neurological: He is alert and oriented to person, place, and time.  Psychiatric: His behavior is normal. His mood appears anxious. His speech is rapid and/or pressured. Thought content is delusional. Cognition and memory are impaired. He expresses impulsivity.    Review of Systems  Constitutional: Negative.   HENT: Negative.   Eyes: Negative.   Respiratory: Negative.   Cardiovascular: Negative.   Gastrointestinal: Negative.   Genitourinary: Negative.   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  Skin: Positive for rash (Axilla rash, draining).  Neurological: Negative.   Endo/Heme/Allergies: Negative.   Psychiatric/Behavioral: Positive for hallucinations. The patient is nervous/anxious and has insomnia.     Blood pressure 120/77, pulse 74, temperature 98.5 F (36.9 C), temperature source Oral, resp. rate 16, height 6\' 3"  (1.905 m), weight 72.6 kg, SpO2 100 %.Body mass index is 20 kg/m.  General Appearance: Disheveled  Eye Contact:  Fair  Speech:  Pressured  Volume:  Increased  Mood:  Anxious  Affect:  Congruent  Thought Process:  Disorganized and Descriptions of Associations: Tangential  Orientation:  Full (Time, Place, and Person)  Thought Content:  Tangential and Abstract Reasoning  Suicidal Thoughts:  No  Homicidal Thoughts:  No  Memory:  Immediate;   Fair Recent;   Fair Remote;   Fair  Judgement:  Poor  Insight:  Lacking  Psychomotor Activity:  Normal   Concentration:  Concentration: Poor and Attention Span: Poor  Recall:  of Knowledge:  Fair  Language:  Fair  Akathisia:  No  Handed:  Right  AIMS (if indicated):     Assets:  Housing Social Support  ADL's:  Intact  Cognition:  WNL  Sleep:        Treatment Plan Summary: Daily contact with patient to assess and evaluate symptoms and progress in treatment, Medication management and Plan for admission to inpatient crisis stabilization. Restart home medications, discontinue Adderall.  Disposition: Recommend psychiatric Inpatient admission when medically cleared.  This service was provided via telemedicine using a 2-way, interactive audio and video technology.  Names of all persons participating in this telemedicine service and their role in this encounter. Name: [employer] Role: Patient  Name: Ernestene Kiel Role: FNP    Berneice Heinrich, FNP 07/06/2019 11:24 AM

## 2019-07-06 NOTE — ED Notes (Signed)
Pt to room 27. Pt oriented to unit.  Pt alert, calm, cooperative, no s/s of distress. Pt denied SI.HI and A/V hallucinations.  Pt resting in bed comfortably.

## 2019-07-06 NOTE — ED Notes (Signed)
Delay explained to patient and father. Pt is comfortable and no acute distress

## 2019-07-06 NOTE — BH Assessment (Signed)
[place] Assessment Progress Note  Per Hampton Abbot, MD, this pt requires psychiatric hospitalization at this time.  Dr Dwyane Dee also finds that pt meets criteria for IVC, which she has initiated.  IVC documents have been faxed to [facility], and at 11:42 Dionisio Paschal confirms receipt.  She has since faxed Findings and Custody Order to this Probation officer.  At 12:07 I called [employer] and spoke to [employer], who took demographic information, agreeing to dispatch law enforcement to fill out Return of Service.  Law enforcement then presented at [facility], completing Return of Service.  The following facilities have been contacted to seek placement for this pt, with results as noted:  Beds available, information sent, decision pending:  [place]:  [facility] (due to medical acuity and need for isolation) [facility] (due to medical acuity and need for isolation) [facility] (due to medical acuity and need for isolation) [facility] (due to medical acuity and need for isolation, however, they may be willing to reconsider on Monday, 07/09/2019)   Jalene Mullet, [place] Coordinator [phone]

## 2019-07-06 NOTE — BH Assessment (Signed)
[facility] Assessment Progress Note  Case was staffed with Tate NP who recommended a inpatient admission.     

## 2019-07-06 NOTE — BHH Counselor (Addendum)
Clinician attempted to call pt's nurse to setup TTS cart however call rang then went to voicemail. Clinician to try again, later.   Vertell Novak, MS, [facility], [facility] Triage Specialist [phone].

## 2019-07-07 MED ORDER — DIPHENHYDRAMINE HCL 25 MG PO CAPS
25.0000 mg | ORAL_CAPSULE | Freq: Four times a day (QID) | ORAL | Status: DC | PRN
  Administered 2019-07-08: 25 mg via ORAL
  Filled 2019-07-07: qty 1

## 2019-07-07 NOTE — ED Notes (Signed)
Patient becoming more and more upset today due to being confined because of his diagnosis of shingles.  He does not seem to be aware of his mental health issues, and has been refusing all po medications except Valtrex.  Patient says if he is not out of the hospital by Monday, he is going to sue the hospital.  He reports, "it is not you guys it is the system."  He is in and out of the shower sometimes taking off his clothes but will not answer nurse when asked why he is in the shower.  He reports his shingles do not itch but are burning and painful yet he continues to touch the area affected.  Refused po benadryl when offered by nurse.  Also he is upset because he is a vegetarian and nutritional services continue to send him regular meal trays.  This Probation officer called service response to get a vegetarian tray.

## 2019-07-07 NOTE — Consult Note (Signed)
Telepsych Consultation   Reason for Consult:  Psychosis Referring Physician:  EDP Location of Patient: [facility] Provider[facility]  Patient Identification: Isaiah Garza MRN:  [id number] Principal Diagnosis: Bipolar I disorder, current or most recent episode manic, with psychotic features (HCC) Diagnosis:  Principal Problem:   Bipolar I disorder, current or most recent episode manic, with psychotic features (HCC) Active Problems:   Cannabis use disorder, moderate, in controlled environment [facility](HCC)   Total Time spent with patient: 30 minutes  HPI:  [facility]: Isaiah Garza is an 28 y.o. male that presents this date voluntary with altered mental state. Patient denies any S/I, H/I or AVH. Patient is actively delusional at the time of assessment and difficult to direct.   Tele Assessment   Isaiah Garza, 28 y.o., male [facility] evaluation of altered mental state .  Patient seen via telepsych by this provider; chart reviewed and consulted with Dr. Lucianne MussKumar on 07/07/19.  On evaluation Isaiah Garza reports, " I am here because I have shingles.  You lock up people with shingles."  Patient is observed walking in the exam room wearing shorts but no shirt.  He responds when called by name but is easily distracted  disorganized, and offers tangential responses to questions.  He is seen tending to objects on his night stand, states he is mixing salt and water to but in his wounds.  Although he is very talkative, he is unable to render his history and offer coherent explanation as to why he is here. He denies the need for inpatient care and is dismissal of this writer's additional attempts at interviewing.      During evaluation Isaiah Garza is seen walking around in the exam room. He is alert/oriented to person, place; anxious and uncooperative; and mood congruent with affect.  Patient is speaking in a clear tone  at loud volume, and fast pace; with fair eye contact.  His thought process is rumination and irrelevant; There is no indication that he is currently responding to internal/external stimuli.  Patient denies suicidal/self-harm/homicidal ideation, although he is delusional as evidenced by stating, "you are keeping me here because I have sores" Patient has remained calm throughout assessment and has answered questions appropriately.   Recommend inpatient hospitalization for medication stabilization.  TTS to locate assignment.    Past Psychiatric History: bipolar disorder  Risk to Self: Suicidal Ideation: No Suicidal Intent: No Is patient at risk for suicide?: No, but patient needs Medical Clearance Suicidal Plan?: No Access to Means: No What has been your use of drugs/alcohol within the last 12 months?: Hx of cannabis use How many times?: 0 Other Self Harm Risks: (Off medications) Triggers for Past Attempts: (NA) Intentional Self Injurious Behavior: None Risk to Others: Homicidal Ideation: No Thoughts of Harm to Others: No Current Homicidal Intent: No Current Homicidal Plan: No Access to Homicidal Means: No Identified Victim: NA History of harm to others?: No Assessment of Violence: None Noted Violent Behavior Description: NA Does patient have access to weapons?: No Criminal Charges Pending?: No Does patient have a court date: No Prior Inpatient Therapy: Prior Inpatient Therapy: Yes Prior Therapy Dates: 2020, 2019 [facility] Reason for Treatment: MH issues Prior Outpatient Therapy: Prior Outpatient Therapy: Yes Prior Therapy Dates: Ongoing Prior Therapy Facilty[facility] Reason for Treatment: Med mang Does patient have an ACCT team?: No Does patient have Intensive In-House Services?  :  No Does patient have [facility] services? : No Does patient have P4CC services?: No  Past Medical History:  Past Medical History:   Diagnosis Date  . ADD (attention deficit disorder)   . ADHD (attention deficit hyperactivity disorder)   . Allergy   . Anxiety   . Arthritis    neck  . Asthma   . Bipolar 1 disorder ([place])   . Depression   . Pulmonary embolism ([place]) 10/2016    Past Surgical History:  Procedure Laterality Date  . NO PAST SURGERIES     Family History:  Family History  Problem Relation Age of Onset  . Cancer Father        testicular  . ADD / ADHD Father   . Depression Father   . Alcohol abuse Mother   . Depression Mother   . Anxiety disorder Sister   . Bipolar disorder Maternal Grandmother   . Dementia Maternal Grandmother   . Bipolar disorder Maternal Aunt   . Bipolar disorder Cousin   . Diabetes Neg Hx   . Heart disease Neg Hx   . Early death Neg Hx   . Stroke Neg Hx    Family Psychiatric  History: unknown Social History:  Social History   Substance and Sexual Activity  Alcohol Use Not Currently  . Alcohol/week: 0.0 standard drinks   Comment: none at present     Social History   Substance and Sexual Activity  Drug Use Yes  . Types: Marijuana    Social History   Socioeconomic History  . Marital status: Single    Spouse name: Not on file  . Number of children: Not on file  . Years of education: Not on file  . Highest education level: Not on file  Occupational History  . Not on file  Social Needs  . Financial resource strain: Not on file  . Food insecurity    Worry: Not on file    Inability: Not on file  . Transportation needs    Medical: Not on file    Non-medical: Not on file  Tobacco Use  . Smoking status: Never Smoker  . Smokeless tobacco: Never Used  Substance and Sexual Activity  . Alcohol use: Not Currently    Alcohol/week: 0.0 standard drinks    Comment: none at present  . Drug use: Yes    Types: Marijuana  . Sexual activity: Not Currently    Partners: Female    Birth control/protection: Condom  Lifestyle  . Physical activity    Days per week: Not  on file    Minutes per session: Not on file  . Stress: Not on file  Relationships  . Social Herbalist on phone: Not on file    Gets together: Not on file    Attends religious service: Not on file    Active member of club or organization: Not on file    Attends meetings of clubs or organizations: Not on file    Relationship status: Not on file  Other Topics Concern  . Not on file  Social History Narrative   Pt lives in [place] with his mother; he stated that he works and is followed by Colonel Bald, NP with [facility].   Additional Social History:    Allergies:  No Known Allergies  Labs:  Results for orders placed or performed during the hospital encounter of 07/05/19 (from the past 48 hour(s))  CBC with Differential/Platelet     Status: None  Collection Time: 07/06/19  5:03 AM  Result Value Ref Range   WBC 5.9 4.0 - 10.5 K/uL   RBC 5.29 4.22 - 5.81 MIL/uL   Hemoglobin 15.7 13.0 - 17.0 g/dL   HCT 96.0 45.4 - 09.8 %   MCV 91.1 80.0 - 100.0 fL   MCH 29.7 26.0 - 34.0 pg   MCHC 32.6 30.0 - 36.0 g/dL   RDW 11.9 14.7 - 82.9 %   Platelets 285 150 - 400 K/uL   nRBC 0.0 0.0 - 0.2 %   Neutrophils Relative % 45 %   Neutro Abs 2.7 1.7 - 7.7 K/uL   Lymphocytes Relative 41 %   Lymphs Abs 2.4 0.7 - 4.0 K/uL   Monocytes Relative 9 %   Monocytes Absolute 0.5 0.1 - 1.0 K/uL   Eosinophils Relative 4 %   Eosinophils Absolute 0.2 0.0 - 0.5 K/uL   Basophils Relative 1 %   Basophils Absolute 0.0 0.0 - 0.1 K/uL   Immature Granulocytes 0 %   Abs Immature Granulocytes 0.01 0.00 - 0.07 K/uL    Comment: Performed at [facility], [address], [place], [place] [address]  Basic metabolic panel     Status: Abnormal   Collection Time: 07/06/19  5:03 AM  Result Value Ref Range   Sodium 140 135 - 145 mmol/L   Potassium 3.8 3.5 - 5.1 mmol/L   Chloride 103 98 - 111 mmol/L   CO2 26 22 - 32 mmol/L   Glucose, Bld 102 (H) 70 - 99 mg/dL   BUN 20 6 - 20  mg/dL   Creatinine, Ser 0.86 0.61 - 1.24 mg/dL   Calcium 9.7 8.9 - 57.8 mg/dL   GFR calc non Af Amer >60 >60 mL/min   GFR calc Af Amer >60 >60 mL/min   Anion gap 11 5 - 15    Comment: Performed at [facility], [address], [place], [place] [address]  Ethanol     Status: None   Collection Time: 07/06/19  5:03 AM  Result Value Ref Range   Alcohol, Ethyl (B) <10 <10 mg/dL    Comment: (NOTE) Lowest detectable limit for serum alcohol is 10 mg/dL. For medical purposes only. Performed at [facility], [address] [place], [place] [address]   Rapid urine drug screen (hospital performed)     Status: Abnormal   Collection Time: 07/06/19  5:03 AM  Result Value Ref Range   Opiates NONE DETECTED NONE DETECTED   Cocaine NONE DETECTED NONE DETECTED   Benzodiazepines NONE DETECTED NONE DETECTED   Amphetamines POSITIVE (A) NONE DETECTED   Tetrahydrocannabinol POSITIVE (A) NONE DETECTED   Barbiturates NONE DETECTED NONE DETECTED    Comment: (NOTE) DRUG SCREEN FOR MEDICAL PURPOSES ONLY.  IF CONFIRMATION IS NEEDED FOR ANY PURPOSE, NOTIFY LAB WITHIN 5 DAYS. LOWEST DETECTABLE LIMITS FOR URINE DRUG SCREEN Drug Class                     Cutoff (ng/mL) Amphetamine and metabolites    1000 Barbiturate and metabolites    200 Benzodiazepine                 200 Tricyclics and metabolites     300 Opiates and metabolites        300 Cocaine and metabolites        300 THC  50 Performed at [facility], [address] [place], [place] [address]   SARS CORONAVIRUS 2 (TAT 6-24 HRS) Nasopharyngeal Nasopharyngeal Swab     Status: None   Collection Time: 07/06/19  9:59 AM   Specimen: Nasopharyngeal Swab  Result Value Ref Range   SARS Coronavirus 2 NEGATIVE NEGATIVE    Comment: (NOTE) SARS-CoV-2 target nucleic acids are NOT DETECTED. The SARS-CoV-2 RNA is generally detectable in upper and lower respiratory  specimens during the acute phase of infection. Negative results do not preclude SARS-CoV-2 infection, do not rule out co-infections with other pathogens, and should not be used as the sole basis for treatment or other patient management decisions. Negative results must be combined with clinical observations, patient history, and epidemiological information. The expected result is Negative. Fact Sheet for Patients: HairSlick.no Fact Sheet for Healthcare Providers: quierodirigir.com This test is not yet approved or cleared by the [place] FDA and  has been authorized for detection and/or diagnosis of SARS-CoV-2 by FDA under an Emergency Use Authorization (EUA). This EUA will remain  in effect (meaning this test can be used) for the duration of the COVID-19 declaration under Section 56 4(b)(1) of the Act, 21 U.S.C. section 360bbb-3(b)(1), unless the authorization is terminated or revoked sooner. Performed at [facility], [address], [place], [place] [address]     Medications:  Current Facility-Administered Medications  Medication Dose Route Frequency Provider Last Rate Last Dose  . acetaminophen (TYLENOL) tablet 650 mg  650 mg Oral Q6H PRN Mesner, Barbara Cower, MD   650 mg at 07/07/19 0013  . benztropine (COGENTIN) tablet 0.5 mg  0.5 mg Oral BID Patrcia Dolly, FNP   0.5 mg at 07/06/19 1121  . diphenhydrAMINE (BENADRYL) capsule 25 mg  25 mg Oral Q6H PRN Linwood Dibbles, MD      . lamoTRIgine (LAMICTAL) tablet 50 mg  50 mg Oral BID Patrcia Dolly, FNP   50 mg at 07/06/19 1121  . ondansetron (ZOFRAN) tablet 4 mg  4 mg Oral Q8H PRN Molpus, John, MD      . risperiDONE (RISPERDAL) tablet 3 mg  3 mg Oral QHS Patrcia Dolly, FNP      . traZODone (DESYREL) tablet 100 mg  100 mg Oral QHS Patrcia Dolly, FNP      . valACYclovir (VALTREX) tablet 1,000 mg  1,000 mg Oral TID Terald Sleeper, MD   1,000 mg at 07/07/19 1549   Current Outpatient  Medications  Medication Sig Dispense Refill  . Glucosamine-Chondroit-Vit C-Mn (GLUCOSAMINE 1500 COMPLEX PO) Take 1 tablet by mouth daily.    Marland Kitchen omega-3 acid ethyl esters (LOVAZA) 1 g capsule Take 1 g by mouth daily.    Marland Kitchen OVER THE COUNTER MEDICATION Take 5-15 mLs by mouth daily as needed. Kratum    . lamoTRIgine (LAMICTAL) 25 MG tablet Take 2 tablets (50 mg total) by mouth 2 (two) times daily. 60 tablet 2    Musculoskeletal: Unable to assess via telepsych  Psychiatric Specialty Exam: Physical Exam  Constitutional: He appears well-developed.  Eyes: Pupils are equal, round, and reactive to light.  Neck: Normal range of motion.  Cardiovascular: Normal rate.  Respiratory: Effort normal.  Musculoskeletal: Normal range of motion.  Neurological: He is alert.    Review of Systems  Psychiatric/Behavioral: Negative for hallucinations (admitted with AVH). The patient is nervous/anxious and has insomnia.     Blood pressure 115/79, pulse 96, temperature 98 F (36.7 C), temperature source Oral, resp.  rate 18, height  (1.905 m), weight 72.6 kg, SpO2 99 %.Body mass index is 20 kg/m.  General Appearance: Bizarre  Eye Contact:  Minimal  Speech:  Pressured and fast  Volume:  Increased  Mood:  Anxious and Irritable  Affect:  Congruent  Thought Process:  Disorganized and Goal Directed  Orientation:  Other:  to person and place  Thought Content:  Illogical, Delusions, Rumination and Tangential  Suicidal Thoughts:  No  Homicidal Thoughts:  No  Memory:  Negative  Judgement:  Impaired  Insight:  Lacking  Psychomotor Activity:  Increased and Restlessness  Concentration:  Concentration: Poor and Attention Span: Poor  Recall:  Negative  Fund of Knowledge:  Poor  Language:  Poor  Akathisia:  No  Handed:  Right  AIMS (if indicated):     Assets:  Desire for Improvement Resilience  ADL's:  Impaired  Cognition:  WNL  Sleep:       Treatment Plan Summary: Mr. Pacholski is a 28 year old male with  pmhx for bipolar disorder,recurrent with psychotic features and Cannabis use currently non-compliant with mental health medications.  He presents to the emergency department with altered mental status and requesting evaluation for "holes in his back" due to "wings on his spinal cord".  Patient with active psychosis, poor judgement, lacking insight, grandiose behaviors but him at increased risk for harm.  Also, he is also non-compliant with his mood stabilizing medications.  Recommend inpatient hospitalization to [address] for medication stabilization and treatment of psychosis.    Daily contact with patient to assess and evaluate symptoms and progress in treatment and Medication management   Continue the following: Anxiety disorder/Sleep Trazodone  po daily, hs  Manic Phase of Bipolar disorder Risperidone  po daily, hs  Bipolar Disorder Lamotrigine  po BID  Drug-Induced Extrapyramidal Reaction Benztropine 0.5mg  po BID   Disposition: Recommend psychiatric Inpatient admission when medically cleared. Supportive therapy provided about ongoing stressors.  This service was provided via telemedicine using a 2-way, interactive audio and video technology.  Names of all persons participating in this telemedicine service and their role in this encounter. Name: Ophelia Shoulder Role: PMHNP  Name: Thedore Mins Role: Psychiatrist  Name: Leonette Most [place] Role: Patient    Chales Abrahams, NP 07/07/2019 7:41 PM

## 2019-07-07 NOTE — ED Provider Notes (Signed)
Vitals:   07/06/19 1938 07/06/19 2158  BP: 131/82 113/80  Pulse: (!) 102 86  Resp: 18 18  Temp: 100.3 F (37.9 C) 98.4 F (36.9 C)  SpO2: 98% 97%   Pt is stable, pending psych disposition.   Dorie Rank, MD 07/07/19 1227

## 2019-07-07 NOTE — ED Notes (Signed)
Patient is sleeping at this time [facility] is aware

## 2019-07-07 NOTE — ED Notes (Signed)
Pt given vegetarian lunch tray at this time.

## 2019-07-07 NOTE — Progress Notes (Signed)
CSW spoke with Naddy at [facility] who states that [facility] is not accepting in outside admissions for psych patients at this time. Naddy checked and confirmed that this patient is not on any of the admitting lists.  CSW attempted to reach staff at [facility] without success, no voicemail option available due to it being after hours / weekend.  Madilyn Fireman, MSW, LCSW-A Transitions of Care  Clinical Social Worker  [facility] Emergency Departments  Medical ICU [phone]

## 2019-07-08 MED ORDER — IBUPROFEN 200 MG PO TABS: 600.0000 mg | ORAL_TABLET | Freq: Three times a day (TID) | ORAL | Status: DC | PRN

## 2019-07-08 MED ORDER — LORAZEPAM 2 MG/ML IJ SOLN: 2.0000 mg | Freq: Once | INTRAMUSCULAR | Status: DC | PRN

## 2019-07-08 NOTE — ED Notes (Addendum)
Patient requested pain medication. Was given tylenol this AM. Requested more medication. MD ordered IBUPROFEN around 1100. patient declined. Patient continually requesting voltaren gel for his right neck/shoulder area. MD notified. MD physically spoke with patient at 1120 and advised no order for voltaren gel will be given due to shingles in same area and potential to irritate skin. MD explained to patient ibuprofen is anti inflammatory and will help with pain. Patient refused again. At this time, patient writing on door window requesting voltaren gel. This nurse explained to patient again the reasoning for no voltaren gel. Offered ibuprofen. Patient declined again.

## 2019-07-08 NOTE — Progress Notes (Signed)
Patient meets criteria for inpatient treatment. No appropriate or available beds at [facility]. CSW faxed referrals to the following facilities for review:  [facility]   TTS will continue to seek bed placement.  Chalmers Guest. Guerry Bruin, MSW, [place] Work/Disposition Phone: [phone] Fax: [phone]

## 2019-07-08 NOTE — ED Notes (Signed)
Patient requested a writing utensil, given a crayon. Patient said, "thank you, that is a lot more effective than using saliva." And proceeded to write on paper, napkins and paper towels his thoughts/ ramblings.

## 2019-07-08 NOTE — Consult Note (Addendum)
Telepsych Consultation   Reason for Consult:  Psychosis Referring Physician:  EDP Location of Patient: [facility] Location of Provider: [facility]  Patient Identification: Isaiah Garza MRN:  [id number] Principal Diagnosis: Bipolar I disorder, current or most recent episode manic, with psychotic features ([place]) Diagnosis:  Principal Problem:   Bipolar I disorder, current or most recent episode manic, with psychotic features ([place]) Active Problems:   Cannabis use disorder, moderate, in controlled environment [facility])   Total Time spent with patient: 30 minutes  HPI:  Per [facility] Assessment note dated 07/06/2019: Isaiah Garza is an 28 y.o. male that presents this date voluntary with altered mental state. Patient denies any S/I, H/I or AVH. Patient is actively delusional at the time of assessment and difficult to direct.   Tele Assessment 07/07/2019: Isaiah Garza, 28 y.o., male patient presented to [facility] voluntarily for evaluation of altered mental state .  Patient seen via telepsych by this provider; chart reviewed and consulted with Dr. Dwyane Dee on 07/08/19.  On evaluation Isaiah Garza reports, " I am here because I have shingles.  You lock up people with shingles."  Patient is observed walking in the exam room wearing shorts but no shirt.  He responds when called by name but is easily distracted  disorganized, and offers tangential responses to questions.  He is seen tending to objects on his night stand, states he is mixing salt and water to but in his wounds.  Although he is very talkative, he is unable to render his history and offer coherent explanation as to why he is here. He denies the need for inpatient care and is dismissal of this writer's additional attempts at interviewing.      During evaluation Isaiah Garza is seen walking around in the exam room. He is alert/oriented to person, place; anxious and uncooperative; and mood congruent with affect.  Patient is speaking in a  clear tone at loud volume, and fast pace; with fair eye contact.  His thought process is rumination and irrelevant; There is no indication that he is currently responding to internal/external stimuli.  Patient denies suicidal/self-harm/homicidal ideation, although he is delusional as evidenced by stating, "you are keeping me here because I have sores" Patient has remained calm throughout assessment and has answered questions appropriately.   Recommend inpatient hospitalization for medication stabilization.  TTS to locate assignment.     Tele Assessment 07/08/2019: The patient is seen and evaluated via tele psych.  He continues to deny the need for hospitalization and states he is going to sue the hospital if he is not discharged within the next 48 hours.  He is very talkative but offers tangential responses to questions.  He states he is the savior of the world and holds several pages of paper up to the camera in which he states he has devised a plan for a "double take". He is very irritable, demonstrates intense energy and with exaggerated behaviors.  He is refusing his psychiatric medications.  He continues to meet inpatient criteria and is awaiting placement.    Past Psychiatric History: bipolar disorder  Risk to Self: Suicidal Ideation: No Suicidal Intent: No Is patient at risk for suicide?: No, but patient needs Medical Clearance Suicidal Plan?: No Access to Means: No What has been your use of drugs/alcohol within the last 12 months?: Hx of cannabis use How many times?: 0 Other Self Harm Risks: (Off medications) Triggers for Past Attempts: (NA) Intentional Self Injurious Behavior: None Risk to  Others: Homicidal Ideation: No Thoughts of Harm to Others: No Current Homicidal Intent: No Current Homicidal Plan: No Access to Homicidal Means: No Identified Victim: NA History of harm to others?: No Assessment of Violence: None Noted Violent Behavior Description: NA Does patient have access  to weapons?: No Criminal Charges Pending?: No Does patient have a court date: No Prior Inpatient Therapy: Prior Inpatient Therapy: Yes Prior Therapy Dates: 2020, 2019 Prior Therapy Facilty/Provider(s): [facility], [facility] Reason for Treatment: MH issues Prior Outpatient Therapy: Prior Outpatient Therapy: Yes Prior Therapy Dates: Ongoing Prior Therapy Facilty[facility] Reason for Treatment: Med mang Does patient have an ACCT team?: No Does patient have Intensive In-House Services?  : No Does patient have [facility] services? : No Does patient have P4CC services?: No  Past Medical History:  Past Medical History:  Diagnosis Date  . ADD (attention deficit disorder)   . ADHD (attention deficit hyperactivity disorder)   . Allergy   . Anxiety   . Arthritis    neck  . Asthma   . Bipolar 1 disorder (HCC)   . Depression   . Pulmonary embolism (HCC) 10/2016    Past Surgical History:  Procedure Laterality Date  . NO PAST SURGERIES     Family History:  Family History  Problem Relation Age of Onset  . Cancer Father        testicular  . ADD / ADHD Father   . Depression Father   . Alcohol abuse Mother   . Depression Mother   . Anxiety disorder Sister   . Bipolar disorder Maternal Grandmother   . Dementia Maternal Grandmother   . Bipolar disorder Maternal Aunt   . Bipolar disorder Cousin   . Diabetes Neg Hx   . Heart disease Neg Hx   . Early death Neg Hx   . Stroke Neg Hx    Family Psychiatric  History: unknown Social History:  Social History   Substance and Sexual Activity  Alcohol Use Not Currently  . Alcohol/week: 0.0 standard drinks   Comment: none at present     Social History   Substance and Sexual Activity  Drug Use Yes  . Types: Marijuana    Social History   Socioeconomic History  . Marital status: Single    Spouse name: Not on file  . Number of children: Not on file  . Years of education: Not on file  . Highest education level: Not on  file  Occupational History  . Not on file  Social Needs  . Financial resource strain: Not on file  . Food insecurity    Worry: Not on file    Inability: Not on file  . Transportation needs    Medical: Not on file    Non-medical: Not on file  Tobacco Use  . Smoking status: Never Smoker  . Smokeless tobacco: Never Used  Substance and Sexual Activity  . Alcohol use: Not Currently    Alcohol/week: 0.0 standard drinks    Comment: none at present  . Drug use: Yes    Types: Marijuana  . Sexual activity: Not Currently    Partners: Female    Birth control/protection: Condom  Lifestyle  . Physical activity    Days per week: Not on file    Minutes per session: Not on file  . Stress: Not on file  Relationships  . Social Musician on phone: Not on file    Gets together: Not on file    Attends religious  service: Not on file    Active member of club or organization: Not on file    Attends meetings of clubs or organizations: Not on file    Relationship status: Not on file  Other Topics Concern  . Not on file  Social History Narrative   Pt lives in [place] with his mother; he stated that he works and is followed by Augusto GarbeJohn Cliff, NP with [facility].   Additional Social History:    Allergies:  No Known Allergies  Labs:  No results found for this or any previous visit (from the past 48 hour(s)).  Medications:  Current Facility-Administered Medications  Medication Dose Route Frequency Provider Last Rate Last Dose  . acetaminophen (TYLENOL) tablet 650 mg  650 mg Oral Q6H PRN Mesner, Barbara CowerJason, MD   650 mg at 07/08/19 0754  . benztropine (COGENTIN) tablet 0.5 mg  0.5 mg Oral BID Patrcia Dollyate, Tina L, FNP   0.5 mg at 07/06/19 1121  . diphenhydrAMINE (BENADRYL) capsule 25 mg  25 mg Oral Q6H PRN Linwood DibblesKnapp, Jon, MD   25 mg at 07/08/19 0052  . ibuprofen (ADVIL) tablet 600 mg  600 mg Oral TID PRN Linwood DibblesKnapp, Jon, MD      . lamoTRIgine (LAMICTAL) tablet 50 mg  50 mg Oral BID Patrcia Dollyate, Tina L,  FNP   50 mg at 07/06/19 1121  . ondansetron (ZOFRAN) tablet 4 mg  4 mg Oral Q8H PRN Molpus, John, MD      . risperiDONE (RISPERDAL) tablet 3 mg  3 mg Oral QHS Patrcia Dollyate, Tina L, FNP      . traZODone (DESYREL) tablet 100 mg  100 mg Oral QHS Patrcia Dollyate, Tina L, FNP      . valACYclovir (VALTREX) tablet 1,000 mg  1,000 mg Oral TID Terald Sleeperrifan, Matthew J, MD   1,000 mg at 07/08/19 1718   Current Outpatient Medications  Medication Sig Dispense Refill  . Glucosamine-Chondroit-Vit C-Mn (GLUCOSAMINE 1500 COMPLEX PO) Take 1 tablet by mouth daily.    Marland Kitchen. omega-3 acid ethyl esters (LOVAZA) 1 g capsule Take 1 g by mouth daily.    Marland Kitchen. OVER THE COUNTER MEDICATION Take 5-15 mLs by mouth daily as needed. Kratum    . lamoTRIgine (LAMICTAL) 25 MG tablet Take 2 tablets (50 mg total) by mouth 2 (two) times daily. 60 tablet 2    Musculoskeletal: Unable to assess via telepsych  Psychiatric Specialty Exam: Physical Exam  Constitutional: He appears well-developed.  Eyes: Pupils are equal, round, and reactive to light.  Neck: Normal range of motion.  Cardiovascular: Normal rate.  Respiratory: Effort normal.  Musculoskeletal: Normal range of motion.  Neurological: He is alert.    Review of Systems  Psychiatric/Behavioral: Negative for hallucinations (admitted with AVH). The patient is nervous/anxious and has insomnia.     Blood pressure 128/74, pulse 82, temperature 97.9 F (36.6 C), temperature source Oral, resp. rate 18, height 6\' 3"  (1.905 m), weight 72.6 kg, SpO2 100 %.Body mass index is 20 kg/m.  General Appearance: Bizarre  Eye Contact:  Minimal  Speech:  Pressured and fast  Volume:  Increased  Mood:  Anxious and Irritable  Affect:  Congruent  Thought Process:  Disorganized and Goal Directed  Orientation:  Other:  to person and place  Thought Content:  Illogical, Delusions, Rumination and Tangential  Suicidal Thoughts:  No  Homicidal Thoughts:  No  Memory:  Negative  Judgement:  Impaired  Insight:  Lacking   Psychomotor Activity:  Increased and Restlessness  Concentration:  Concentration:  Poor and Attention Span: Poor  Recall:  Negative  Fund of Knowledge:  Poor  Language:  Poor  Akathisia:  No  Handed:  Right  AIMS (if indicated):     Assets:  Desire for Improvement Resilience  ADL's:  Impaired  Cognition:  WNL  Sleep:   impaired   Assessment:Isaiah Garza is a 28 year old male with pmhx for bipolar disorder,recurrent with psychotic features and Cannabis use currently non-compliant with mental health medications.  He presents to the emergency department with altered mental status and requesting evaluation for "holes in his back" due to "wings on his spinal cord".  Patient with active psychosis, poor judgement, lacking insight, grandiose behaviors but him at increased risk for harm.  Also, he is also non-compliant with his mood stabilizing medications.  Recommend inpatient hospitalization to [address] for medication stabilization and treatment of psychosis.    Treatment Plan Summary: Patient meets criteria for inpatient treatment. No appropriate or available beds at [facility]. CSW faxed referrals and currently awaiting further information. In the interim will continue:  Daily contact with patient to assess and evaluate symptoms and progress in treatment and Medication management   Continue the following: Anxiety disorder/Sleep Trazodone  po daily, hs  Manic Phase of Bipolar disorder Risperidone  po daily, hs  Bipolar Disorder Lamotrigine  po BID  Drug-Induced Extrapyramidal Reaction Benztropine 0.5mg  po BID   Disposition: Recommend psychiatric Inpatient admission when medically cleared. Supportive therapy provided about ongoing stressors.  This service was provided via telemedicine using a 2-way, interactive audio and video technology.  Names of all persons participating in this telemedicine service and their role in this encounter. Name: Ophelia Shoulder Role: PMHNP  Name: Thedore Mins Role: Psychiatrist  Name: Isaiah Garza Role: Patient    Chales Abrahams, NP 07/08/2019 5:26 PM  Patient seen face-to-face for psychiatric evaluation, chart reviewed and case discussed with the physician extender and developed treatment plan. Reviewed the information documented and agree with the treatment plan. Thedore Mins, MD

## 2019-07-08 NOTE — ED Provider Notes (Signed)
Vitals:   07/07/19 2051 07/08/19 0656  BP: (!) 159/98 126/85  Pulse: 83 83  Resp: 18 18  Temp: 99.3 F (37.4 C) 98.5 F (36.9 C)  SpO2: 99% 100%   Pt complaining of muscle pain.  Requested voltaren gel.  I think that might cause some irritation to his shingles rash.  Recommended ibuprofen and tylenol.  Medically stable.  Awaiting psych dispo   Dorie Rank, MD 07/08/19 [phone]

## 2019-07-09 ENCOUNTER — Encounter (HOSPITAL_COMMUNITY): Payer: Self-pay | Admitting: Registered Nurse

## 2019-07-09 DIAGNOSIS — F19959 Other psychoactive substance use, unspecified with psychoactive substance-induced psychotic disorder, unspecified: Secondary | ICD-10-CM

## 2019-07-09 MED ORDER — BENZTROPINE MESYLATE 0.5 MG PO TABS: 0.5000 mg | ORAL_TABLET | Freq: Two times a day (BID) | ORAL | 0 refills | Status: DC

## 2019-07-09 MED ORDER — VALACYCLOVIR HCL 1 G PO TABS: 1000.0000 mg | ORAL_TABLET | Freq: Three times a day (TID) | ORAL | 0 refills | Status: DC

## 2019-07-09 MED ORDER — LAMOTRIGINE 25 MG PO TABS: 50.0000 mg | ORAL_TABLET | Freq: Two times a day (BID) | ORAL | 2 refills | Status: AC

## 2019-07-09 NOTE — BH Assessment (Signed)
[facility] Assessment Progress Note  Per Hampton Abbot, MD, this pt does not require psychiatric hospitalization at this time.  Pt presents under IVC initiated by Dr Dwyane Dee, and which she has now rescinded.  Pt is to be discharged from [facility] with prescriptions, and with recommendation to continue treatment with his current outpatient providers at the [facility].  This has been included in pt's discharge instructions.  This Probation officer will go to [facility] shortly to pick up prescriptions.  Pt's nurse has been notified.  Jalene Mullet, [place] Triage Specialist [phone]

## 2019-07-09 NOTE — Discharge Instructions (Signed)
For your behavioral health needs, you are advised to continue treatment with the [facility]: ° °     [facility] °     [address],  °     [place], [place] [address] °     [phone]

## 2019-07-09 NOTE — ED Notes (Signed)
Awaiting Gershon Mussel [facility] counselor to bring patient his prescriptions from [facility] and his updated information about his IVC.

## 2019-07-09 NOTE — ED Notes (Signed)
TTS has been completed. Patient is calm and cooperative. He is resting quietly with eyes closed.

## 2019-07-09 NOTE — ED Notes (Signed)
Patient gave me permission to speak with his mother about his care. Informed patient's mother that he had been evaluated by psychiatry NP this morning and redeemed component. She was not happy about this. However, she was in the lobby waiting on him when he ambulated out of facility.   During discharge, he stated he would take his medication.

## 2019-07-09 NOTE — Consult Note (Signed)
[facility] Psych ED Discharge  07/09/2019 12:53 PM Isaiah Garza  MRN:  [id number] Principal Problem: Psychoactive substance-induced psychosis [facility]) Discharge Diagnoses: Principal Problem:   Psychoactive substance-induced psychosis ([place]) Active Problems:   Cannabis use disorder, moderate, in controlled environment ([place])   Bipolar I disorder, current or most recent episode manic, with psychotic features [facility])   Subjective: Isaiah Garza, 28 y.o., male patient seen via tele psych by this provider, Dr. Dwyane Dee; and chart reviewed on 07/09/19.  On evaluation Isaiah Garza reports prior to admission he had used LSD " I try to tell father that I was going to be okay but he will listen."  Patient states he is feeling fine.  He denies suicidal/self-harm/homicidal ideation, psychosis, and paranoia.  Patient [facility].  Reports that he is compliant with his medications.  Patient's UDS was positive for amphetamines after reviewing PDMP patient is prescribed Adderall. During evaluation Isaiah Garza is alert/oriented x 4; calm/cooperative; and mood is congruent with affect.  He does not appear to be responding to internal/external stimuli or delusional thoughts.  Patient denies suicidal/self-harm/homicidal ideation, psychosis, and paranoia.  Patient answered question appropriately.     Total Time spent with patient: 30 minutes  Past Psychiatric History: See above list  Past Medical History:  Past Medical History:  Diagnosis Date  . ADD (attention deficit disorder)   . ADHD (attention deficit hyperactivity disorder)   . Allergy   . Anxiety   . Arthritis    neck  . Asthma   . Bipolar 1 disorder ([place])   . Depression   . Pulmonary embolism ([place]) 10/2016    Past Surgical History:  Procedure Laterality Date  . NO PAST SURGERIES     Family History:  Family History  Problem Relation Age of Onset  . Cancer Father        testicular  . ADD /  ADHD Father   . Depression Father   . Alcohol abuse Mother   . Depression Mother   . Anxiety disorder Sister   . Bipolar disorder Maternal Grandmother   . Dementia Maternal Grandmother   . Bipolar disorder Maternal Aunt   . Bipolar disorder Cousin   . Diabetes Neg Hx   . Heart disease Neg Hx   . Early death Neg Hx   . Stroke Neg Hx    Family Psychiatric  History: See above list Social History:  Social History   Substance and Sexual Activity  Alcohol Use Not Currently  . Alcohol/week: 0.0 standard drinks   Comment: none at present     Social History   Substance and Sexual Activity  Drug Use Yes  . Types: Marijuana    Social History   Socioeconomic History  . Marital status: Single    Spouse name: Not on file  . Number of children: Not on file  . Years of education: Not on file  . Highest education level: Not on file  Occupational History  . Not on file  Social Needs  . Financial resource strain: Not on file  . Food insecurity    Worry: Not on file    Inability: Not on file  . Transportation needs    Medical: Not on file    Non-medical: Not on file  Tobacco Use  . Smoking status: Never Smoker  . Smokeless tobacco: Never Used  Substance and Sexual Activity  . Alcohol use: Not Currently    Alcohol/week:  0.0 standard drinks    Comment: none at present  . Drug use: Yes    Types: Marijuana  . Sexual activity: Not Currently    Partners: Female    Birth control/protection: Condom  Lifestyle  . Physical activity    Days per week: Not on file    Minutes per session: Not on file  . Stress: Not on file  Relationships  . Social Musician on phone: Not on file    Gets together: Not on file    Attends religious service: Not on file    Active member of club or organization: Not on file    Attends meetings of clubs or organizations: Not on file    Relationship status: Not on file  Other Topics Concern  . Not on file  Social History Narrative   Pt  lives in [place] with his mother; he stated that he works and is followed by Augusto Garbe, NP with [facility].    Has this patient used any form of tobacco in the last 30 days? (Cigarettes, Smokeless Tobacco, Cigars, and/or Pipes) Prescription not provided because: Patient does not use tobacco products  Current Medications: Current Facility-Administered Medications  Medication Dose Route Frequency Provider Last Rate Last Dose  . acetaminophen (TYLENOL) tablet 650 mg  650 mg Oral Q6H PRN Mesner, Barbara Cower, MD   650 mg at 07/08/19 0754  . benztropine (COGENTIN) tablet 0.5 mg  0.5 mg Oral BID Patrcia Dolly, FNP   0.5 mg at 07/06/19 1121  . diphenhydrAMINE (BENADRYL) capsule 25 mg  25 mg Oral Q6H PRN Linwood Dibbles, MD   25 mg at 07/08/19 0052  . ibuprofen (ADVIL) tablet 600 mg  600 mg Oral TID PRN Linwood Dibbles, MD      . lamoTRIgine (LAMICTAL) tablet 50 mg  50 mg Oral BID Patrcia Dolly, FNP   50 mg at 07/09/19 1036  . LORazepam (ATIVAN) injection 2 mg  2 mg Intramuscular Once PRN Tegeler, Canary Brim, MD      . ondansetron [facility]) tablet 4 mg  4 mg Oral Q8H PRN Molpus, John, MD      . risperiDONE (RISPERDAL) tablet 3 mg  3 mg Oral QHS Patrcia Dolly, FNP      . traZODone (DESYREL) tablet 100 mg  100 mg Oral QHS Patrcia Dolly, FNP      . valACYclovir (VALTREX) tablet 1,000 mg  1,000 mg Oral TID Terald Sleeper, MD   1,000 mg at 07/09/19 1037   Current Outpatient Medications  Medication Sig Dispense Refill  . Glucosamine-Chondroit-Vit C-Mn (GLUCOSAMINE 1500 COMPLEX PO) Take 1 tablet by mouth daily.    Marland Kitchen omega-3 acid ethyl esters (LOVAZA) 1 g capsule Take 1 g by mouth daily.    . benztropine (COGENTIN) 0.5 MG tablet Take 1 tablet (0.5 mg total) by mouth 2 (two) times daily. 60 tablet 0  . lamoTRIgine (LAMICTAL) 25 MG tablet Take 2 tablets (50 mg total) by mouth 2 (two) times daily. 60 tablet 2  . valACYclovir (VALTREX) 1000 MG tablet Take 1 tablet (1,000 mg total) by mouth 3 (three) times daily.  90 tablet 0   PTA Medications: (Not in a hospital admission)   Musculoskeletal: Strength & Muscle Tone: within normal limits Gait & Station: normal Patient leans: N/A  Psychiatric Specialty Exam: Physical Exam  Nursing note and vitals reviewed. Constitutional: He is oriented to person, place, and time. He appears well-nourished. No distress.  Neck: Normal  range of motion.  Respiratory: Effort normal.  Musculoskeletal: Normal range of motion.  Neurological: He is alert and oriented to person, place, and time.  Psychiatric: He has a normal mood and affect. His speech is normal. Judgment and thought content normal. Cognition and memory are normal.    Review of Systems  Psychiatric/Behavioral: Positive for substance abuse. Depression: Stable. Hallucinations: Denies. Suicidal ideas: Denies. Nervous/anxious: Denies. Insomnia: States prior to admission to ED hadn't slept related to LSD use.   All other systems reviewed and are negative.   Blood pressure 122/73, pulse 77, temperature 98.3 F (36.8 C), temperature source Oral, resp. rate 18, height 6\' 3"  (1.905 m), weight 72.6 kg, SpO2 97 %.Body mass index is 20 kg/m.  General Appearance: Casual  Eye Contact:  Good  Speech:  Clear and Coherent and Normal Rate  Volume:  Normal  Mood:  "I'm great, just fine" appropriate  Affect:  Appropriate and Congruent  Thought Process:  Coherent, Goal Directed and Descriptions of Associations: Intact  Orientation:  Full (Time, Place, and Person)  Thought Content:  WDL and Logical  Suicidal Thoughts:  No  Homicidal Thoughts:  No  Memory:  Immediate;   Good Recent;   Good  Judgement:  Intact  Insight:  Present  Psychomotor Activity:  Normal  Concentration:  Concentration: Good and Attention Span: Good  Recall:  Good  Fund of Knowledge:  Good  Language:  Good  Akathisia:  No  Handed:  Right  AIMS (if indicated):     Assets:  Communication Skills Desire for Improvement Social Support   ADL's:  Intact  Cognition:  WNL  Sleep:        Demographic Factors:  Male  Loss Factors: NA  Historical Factors: NA  Risk Reduction Factors:   Sense of responsibility to family, Religious beliefs about death, Employed, Living with another person, especially a relative, Positive social support and Positive therapeutic relationship  Continued Clinical Symptoms:  Alcohol/Substance Abuse/Dependencies Previous Psychiatric Diagnoses and Treatments  Cognitive Features That Contribute To Risk:  None    Suicide Risk:  Minimal: No identifiable suicidal ideation.  Patients presenting with no risk factors but with morbid ruminations; may be classified as minimal risk based on the severity of the depressive symptoms    Plan Of Care/Follow-up recommendations:  Activity:  As tolerated Diet:  Heart healthy Other:  [facility]  Disposition: Patient psychiatrically cleared No evidence of imminent risk to self or others at present.   Patient does not meet criteria for psychiatric inpatient admission. Supportive therapy provided about ongoing stressors. Discussed crisis plan, support from social network, calling 911, coming to the Emergency Department, and calling Suicide Hotline.    Carman Essick, NP 07/09/2019, 12:53 PM

## 2019-07-09 NOTE — ED Notes (Signed)
Patient slept for a few hours and is not awake pacing the room. Requested that his bed be changed because he sweated through his sheets because he had some bad dreams. Patient is cooperative and calm at this time.

## 2019-07-09 NOTE — BH Assessment (Signed)
[facility] Assessment Progress Note  Prescriptions for pt have been placed in chart rack to be given to him upon discharge.  Pt's nurse has been notified.  Jalene Mullet, [facility] Coordinator [phone]

## 2019-07-09 NOTE — ED Notes (Signed)
Patient messing around in the room, cabinets locked. Patient states, "oh I guess I should not have done that bad thing." Patient unable to explain this statement.

## 2019-07-17 ENCOUNTER — Emergency Department (HOSPITAL_COMMUNITY)
Admission: EM | Admit: 2019-07-17 | Discharge: 2019-07-19 | Disposition: A | Discharge status: Other Acute Inpatient Hospital | Payer: Self-pay | Attending: Emergency Medicine | Admitting: Emergency Medicine

## 2019-07-17 ENCOUNTER — Encounter (HOSPITAL_COMMUNITY): Payer: Self-pay | Admitting: Emergency Medicine

## 2019-07-17 DIAGNOSIS — Z046 Encounter for general psychiatric examination, requested by authority: Secondary | ICD-10-CM | POA: Insufficient documentation

## 2019-07-17 DIAGNOSIS — Z79899 Other long term (current) drug therapy: Secondary | ICD-10-CM | POA: Insufficient documentation

## 2019-07-17 DIAGNOSIS — J45909 Unspecified asthma, uncomplicated: Secondary | ICD-10-CM | POA: Insufficient documentation

## 2019-07-17 DIAGNOSIS — R21 Rash and other nonspecific skin eruption: Secondary | ICD-10-CM | POA: Insufficient documentation

## 2019-07-17 DIAGNOSIS — Z20828 Contact with and (suspected) exposure to other viral communicable diseases: Secondary | ICD-10-CM | POA: Insufficient documentation

## 2019-07-17 DIAGNOSIS — F29 Unspecified psychosis not due to a substance or known physiological condition: Secondary | ICD-10-CM

## 2019-07-17 DIAGNOSIS — F312 Bipolar disorder, current episode manic severe with psychotic features: Secondary | ICD-10-CM | POA: Insufficient documentation

## 2019-07-17 LAB — SALICYLATE LEVEL: Salicylate Lvl: <7 mg/dL (ref 2.8–30.0)

## 2019-07-17 LAB — CBC
HCT: 44.1 % (ref 39.0–52.0)
Hemoglobin: 14.9 g/dL (ref 13.0–17.0)
MCH: 29.9 pg (ref 26.0–34.0)
MCHC: 33.8 g/dL (ref 30.0–36.0)
MCV: 88.6 fL (ref 80.0–100.0)
Platelets: 328 10*3/uL (ref 150–400)
RBC: 4.98 MIL/uL (ref 4.22–5.81)
RDW: 12.7 % (ref 11.5–15.5)
WBC: 5.7 10*3/uL (ref 4.0–10.5)
nRBC: 0 % (ref 0.0–0.2)

## 2019-07-17 LAB — COMPREHENSIVE METABOLIC PANEL
ALT: 15 U/L (ref 0–44)
AST: 20 U/L (ref 15–41)
Albumin: 4.1 g/dL (ref 3.5–5.0)
Alkaline Phosphatase: 54 U/L (ref 38–126)
Anion gap: 10 (ref 5–15)
BUN: 20 mg/dL (ref 6–20)
CO2: 23 mmol/L (ref 22–32)
Calcium: 8.8 mg/dL — ABNORMAL LOW (ref 8.9–10.3)
Chloride: 103 mmol/L (ref 98–111)
Creatinine, Ser: 0.92 mg/dL (ref 0.61–1.24)
GFR calc Af Amer: >60 mL/min (ref >60)
GFR calc non Af Amer: >60 mL/min (ref >60)
Glucose, Bld: 99 mg/dL (ref 70–99)
Potassium: 4 mmol/L (ref 3.5–5.1)
Sodium: 136 mmol/L (ref 135–145)
Total Bilirubin: 0.6 mg/dL (ref 0.3–1.2)
Total Protein: 7 g/dL (ref 6.5–8.1)

## 2019-07-17 LAB — ACETAMINOPHEN LEVEL: Acetaminophen (Tylenol), Serum: <10 ug/mL — ABNORMAL LOW (ref 10–30)

## 2019-07-17 LAB — ETHANOL: Alcohol, Ethyl (B): <10 mg/dL (ref <10)

## 2019-07-17 MED ORDER — BENZTROPINE MESYLATE 1 MG PO TABS
0.5000 mg | ORAL_TABLET | Freq: Two times a day (BID) | ORAL | Status: DC
  Filled 2019-07-17 (×2): qty 1

## 2019-07-17 MED ORDER — OMEGA-3-ACID ETHYL ESTERS 1 G PO CAPS
1.0000 g | ORAL_CAPSULE | Freq: Every day | ORAL | Status: DC
  Administered 2019-07-18 – 2019-07-19 (×3): 1 g via ORAL
  Filled 2019-07-17 (×4): qty 1

## 2019-07-17 MED ORDER — LAMOTRIGINE 25 MG PO TABS
50.0000 mg | ORAL_TABLET | Freq: Two times a day (BID) | ORAL | Status: DC
  Filled 2019-07-17 (×2): qty 2

## 2019-07-17 NOTE — ED Provider Notes (Addendum)
[facility] EMERGENCY DEPARTMENT Provider Note   CSN: [id number] Arrival date & time: 07/17/19  1756     History   Chief Complaint Chief Complaint  Patient presents with  . Suicidal  . Hallucinations  . IVC    HPI Isaiah Garza is a 28 y.o. male.    Level 5 caveat due to psychiatric disorder. HPI Patient presents under IVC.  Reportedly has history of bipolar schizophrenia.  Reportedly is hallucinating and is speaking to aliens.  Reportedly not taking his medicines.  Reportedly is also going to move to [place].  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  States he has used LSD, but not that recently. Past Medical History:  Diagnosis Date  . ADD (attention deficit disorder)   . ADHD (attention deficit hyperactivity disorder)   . Allergy   . Anxiety   . Arthritis    neck  . Asthma   . Bipolar 1 disorder (HCC)   . Depression   . Pulmonary embolism (HCC) 10/2016    Patient Active Problem List   Diagnosis Date Noted  . Psychoactive substance-induced psychosis (HCC) 07/09/2019  . Bipolar I disorder, current or most recent episode manic, with psychotic features (HCC) 05/30/2019  . Bipolar I disorder (HCC) 11/03/2016  . Cannabis use disorder, moderate, in controlled environment (HCC) 08/08/2016    Past Surgical History:  Procedure Laterality Date  . NO PAST SURGERIES          Home Medications    Prior to Admission medications   Medication Sig Start Date End Date Taking? Authorizing Provider  benztropine (COGENTIN) 0.5 MG tablet Take 1 tablet (0.5 mg total) by mouth 2 (two) times daily. 07/09/19   Rankin, Shuvon B, NP  Glucosamine-Chondroit-Vit C-Mn (GLUCOSAMINE 1500 COMPLEX PO) Take 1 tablet by mouth daily.    [provider]  lamoTRIgine (LAMICTAL) 25 MG tablet Take 2 tablets (50 mg total) by mouth 2 (two) times daily. 07/09/19   Rankin, Shuvon B, NP  omega-3 acid ethyl esters (LOVAZA) 1 g capsule Take 1 g by mouth daily.    [provider]  valACYclovir (VALTREX) 1000 MG tablet Take 1 tablet (1,000 mg total) by mouth 3 (three) times daily. 07/09/19   Rankin, Shuvon B, NP  risperiDONE (RISPERDAL) 4 MG tablet Take 1 tablet (4 mg total) by mouth at bedtime. Patient not taking: Reported on 07/06/2019 06/06/19 07/06/19  Malvin Johns, MD  temazepam (RESTORIL) 30 MG capsule Take 1 capsule (30 mg total) by mouth at bedtime. Patient not taking: Reported on 07/06/2019 06/06/19 07/06/19  Malvin Johns, MD  trazodone (DESYREL) 300 MG tablet Take 1 tablet (300 mg total) by mouth at bedtime as needed for sleep. Patient not taking: Reported on 07/06/2019 06/06/19 07/06/19  Malvin Johns, MD    Family History Family History  Problem Relation Age of Onset  . Cancer Father        testicular  . ADD / ADHD Father   . Depression Father   . Alcohol abuse Mother   . Depression Mother   . Anxiety disorder Sister   . Bipolar disorder Maternal Grandmother   . Dementia Maternal Grandmother   . Bipolar disorder Maternal Aunt   . Bipolar disorder Cousin   . Diabetes Neg Hx   . Heart disease Neg Hx   . Early death Neg Hx   . Stroke Neg Hx     Social History Social History   Tobacco Use  . Smoking status: Never  Smoker  . Smokeless tobacco: Never Used  Substance Use Topics  . Alcohol use: Not Currently    Alcohol/week: 0.0 standard drinks    Comment: none at present  . Drug use: Yes    Types: Marijuana     Allergies   Patient has no known allergies.   Review of Systems Review of Systems  Unable to perform ROS: Psychiatric disorder     Physical Exam Updated Vital Signs BP 125/75 (BP Location: Left Arm)   Pulse 76   Temp 98.2 F (36.8 C) (Oral)   Resp 20   SpO2 100%   Physical Exam Vitals signs and nursing note reviewed.  HENT:     Head: Atraumatic.  Eyes:     Extraocular Movements: Extraocular movements intact.     Pupils: Pupils are equal, round, and reactive to light.  Cardiovascular:     Rate and  Rhythm: Regular rhythm.  Abdominal:     Tenderness: There is no abdominal tenderness.  Musculoskeletal:        General: No tenderness.  Skin:    General: Skin is warm.     Capillary Refill: Capillary refill takes less than 2 seconds.  Neurological:     Mental Status: He is alert.     Comments: Awake and pleasant, but somewhat difficult to get history from.  Does not appear to be responding to be internal stimuli.      ED Treatments / Results  Labs (all labs ordered are listed, but only abnormal results are displayed) Labs Reviewed  COMPREHENSIVE METABOLIC PANEL - Abnormal; Notable for the following components:      Result Value   Calcium 8.8 (*)    All other components within normal limits  ACETAMINOPHEN LEVEL - Abnormal; Notable for the following components:   Acetaminophen (Tylenol), Serum <10 (*)    All other components within normal limits  ETHANOL  SALICYLATE LEVEL  CBC  RAPID URINE DRUG SCREEN, HOSP PERFORMED    EKG None  Radiology No results found.  Procedures Procedures (including critical care time)  Medications Ordered in ED Medications  benztropine (COGENTIN) tablet 0.5 mg (has no administration in time range)  lamoTRIgine (LAMICTAL) tablet 50 mg (has no administration in time range)  omega-3 acid ethyl esters (LOVAZA) capsule 1 g (has no administration in time range)     Initial Impression / Assessment and Plan / ED Course  I have reviewed the triage vital signs and the nursing notes.  Pertinent labs & imaging results that were available during my care of the patient were reviewed by me and considered in my medical decision making (see chart for details).        Patient brought in under IVC.  Reportedly hallucinating and not taking his medicines.  Per IVC paperwork and stated to his parents the police will not take him alive.  Appears to be medically cleared.  To be seen by TTS  First exam paperwork has been done    Final Clinical  Impressions(s) / ED Diagnoses   Final diagnoses:  Psychosis, unspecified psychosis type [facility])    ED Discharge Orders    None       Davonna Belling, MD 07/17/19 [place], Wade Sigala, MD 07/17/19 2201

## 2019-07-17 NOTE — ED Notes (Signed)
Family is extremely  concern about him being released it is not safe  Pt is also vegetarian please make sure pt does not get meat and has enough to eat father name is  Kaire please call with a plan of care. [phone]

## 2019-07-17 NOTE — BH Assessment (Signed)
Clinician contacted Triage in an effort to conduct pt's [place] Assessment. Pt's nurse, Clydene Laming, stated pt is still in the waiting room and that there is no room to move him to for the assessment to take place. Clinician requested Herbie Baltimore call clinician when pt is in the process of being moved so pt can have his assessment completed as soon as possible. Herbie Baltimore agreed to do so.

## 2019-07-17 NOTE — BH Assessment (Signed)
Clinician contacted Triage in an effort to determine if pt had yet been seen by an EDP, as TTS has been informed, and it was reinforced during a meeting today, that we are not to complete assessments until pts have been medically cleared. Pt's nurse, Clydene Laming, stated that pt has not yet seen an EDP and that he is still waiting for a room. Clinician explained that pt cannot be assessed until pt is medically cleared by an EDP; pt's nurse expressed an understanding.

## 2019-07-17 NOTE — ED Notes (Signed)
[employer] remains with the patient at this time. He was dressed out in the t-shirt and wanded by security.   Sitter now present. The patient is ambulating to the bathroom without difficulty and is demanding to speak with social work before the Dr. He also states he does not eat meats and is a vegetarian. The pt was given a drink, cheese, and crackers.   EDP [place] notified to see the patient.

## 2019-07-17 NOTE — ED Triage Notes (Signed)
Pt with [employer] after being IVC'd due to hallucinations and being off medication. Pt states he is moving to [place] but lives with brother in law right now. 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

## 2019-07-18 LAB — RAPID URINE DRUG SCREEN, HOSP PERFORMED
Amphetamines: NOT DETECTED
Barbiturates: NOT DETECTED
Benzodiazepines: NOT DETECTED
Cocaine: NOT DETECTED
Opiates: NOT DETECTED
Tetrahydrocannabinol: POSITIVE — AB

## 2019-07-18 LAB — SARS CORONAVIRUS 2 BY RT PCR (HOSPITAL ORDER, PERFORMED IN ~~LOC~~ HOSPITAL LAB): SARS Coronavirus 2: NEGATIVE

## 2019-07-18 MED ORDER — ACETAMINOPHEN 500 MG PO TABS
1000.0000 mg | ORAL_TABLET | Freq: Three times a day (TID) | ORAL | Status: DC | PRN
  Administered 2019-07-18 – 2019-07-19 (×2): 1000 mg via ORAL
  Filled 2019-07-18 (×2): qty 2

## 2019-07-18 NOTE — ED Notes (Signed)
Patient requesting something for chronic pain that he has in his neck and back. Will make ED provider aware so that an order for pain medication can be obtained.

## 2019-07-18 NOTE — ED Notes (Signed)
Breakfast tray order placed - vegetarian.

## 2019-07-18 NOTE — ED Notes (Signed)
Pt in shower.  

## 2019-07-18 NOTE — ED Notes (Signed)
Belongings inventoried and placed in locker #3.  

## 2019-07-18 NOTE — ED Notes (Addendum)
Pt states that his father lied to [facility] about him and that he wants to speak to [facility]. He wants to know when he was determined to be a danger to himself and when it was determined that he was Bipolar. Pt states that he does not want to be admitted. Pt states that he wants the "72 hour paper that you have to have judge privileges for to keep me here". This RN is not familiar with what the pt is talking about. This RN attempted to contact [facility] about this.

## 2019-07-18 NOTE — ED Notes (Signed)
TTS in process 

## 2019-07-18 NOTE — ED Notes (Signed)
Meal tray delivered to pt

## 2019-07-18 NOTE — Care Management (Signed)
Writer faxed referral to [facility] [phone]).  Per [place], no appropriate bed at this time.

## 2019-07-18 NOTE — ED Notes (Signed)
Pt at nursing station on phone.

## 2019-07-18 NOTE — BH Assessment (Addendum)
10/28: Per Elmyra Ricks, Patient accepted to [facility] in [place]. The accepting provider is Nelida Gores, MD. Room [phone]. The nursing report number [phone]. Patient's nurse/Chelsea updated with patient's disposition information.

## 2019-07-18 NOTE — ED Notes (Signed)
Patient was given Cup of Sprite and [employer].

## 2019-07-18 NOTE — BH Assessment (Signed)
Tele Assessment Note   Patient Name: Isaiah Garza MRN: [id number] Referring Physician: Dr. Cathren LaineKevin Steinl, MD [facility] Location of Provider: Behavioral Health TTS Department  Isaiah Garza is a 28 y.o. male [facility] under an [facility]C order. The order states:  Respondent is dx [with] bipolar and schizophrenia; he is having hallucinations that he is speaking to aliens and is making machines out of aluminum and batteries. Has stated to parents that police 'won't take [him] alive.' Is prescribed medication but is not taking it. Is a danger to himself.  Pt denies these allegations, stating his family has "betrayed [him]" and that he plans to move to [place] so he can get away from them. When clinician inquired about these plans, pt states he has worked on the details but has not yet made a plan in regards to where to live, etc. Pt denies SI or a hx of SI, any prior attempts to kill himself, states he's been hospitalized for mental health reasons approximately 6 times, and denies any plan to kill himself. Pt denies [place], NSSIB, access to guns, and engagement in the legal system. When clinician inquired about pt experiencing AVH, pt stated he doesn't fully know what others see, so he couldn't verify whether he's experienced AVH. Pt acknowledges recent marijuana and LSD use.  Pt states he was dx with bipolar disorder at the age of 28 and that he's been on medication off-and-on since that time. Pt states he's never had a medication work/improve things for him, so he wants his mind and body to be "free" to think and explore what they want rather than to be controlled by medication. Pt also noted that his mother has a PhD in Child Psychology, stating that he always felt she thought something was wrong with him.  Pt is oriented x4. His recent and remote memory is intact. Pt was cooperative throughout the assessment, though he was not overly forthcoming with information.  Pt's insight, judgement, and impulse control is impaired at this time.   Diagnosis: F31.2, Bipolar I disorder, Current or most recent episode manic, With psychotic features   Past Medical History:  Past Medical History:  Diagnosis Date  . ADD (attention deficit disorder)   . ADHD (attention deficit hyperactivity disorder)   . Allergy   . Anxiety   . Arthritis    neck  . Asthma   . Bipolar 1 disorder (HCC)   . Depression   . Pulmonary embolism (HCC) 10/2016    Past Surgical History:  Procedure Laterality Date  . NO PAST SURGERIES      Family History:  Family History  Problem Relation Age of Onset  . Cancer Father        testicular  . ADD / ADHD Father   . Depression Father   . Alcohol abuse Mother   . Depression Mother   . Anxiety disorder Sister   . Bipolar disorder Maternal Grandmother   . Dementia Maternal Grandmother   . Bipolar disorder Maternal Aunt   . Bipolar disorder Cousin   . Diabetes Neg Hx   . Heart disease Neg Hx   . Early death Neg Hx   . Stroke Neg Hx     Social History:  reports that he has never smoked. He has never used smokeless tobacco. He reports previous alcohol use. He reports current drug use. Drug: Marijuana.  Additional Social History:  Alcohol / Drug Use Pain Medications: Please  see MAR Prescriptions: Please see MAR Over the Counter: Please see MAR History of alcohol / drug use?: Yes Longest period of sobriety (when/how long): Unknown Substance #1 Name of Substance 1: Marijuana 1 - Age of First Use: 12 1 - Amount (size/oz): A couple hits off of a dab pen 1 - Frequency: 3-4 times/week 1 - Duration: Unknown 1 - Last Use / Amount: 07/16/2019 Substance #2 Name of Substance 2: LSD 2 - Age of First Use: [age] 2 - Amount (size/oz): Unknown 2 - Frequency: Infrequently; recently used for the 1st x in years, states he's probaby used around 20 times in his life 2 - Duration: Unknown 2 - Last Use / Amount: "A few days ago."  CIWA:  CIWA-Ar BP: (!) 106/53 Pulse Rate: 64 COWS:    Allergies: No Known Allergies  Home Medications: (Not in a hospital admission)   OB/GYN Status:  No LMP for male patient.  General Assessment Data Assessment unable to be completed: Yes Reason for not completing assessment: Pt is still in the waiting room; there is no room to move him to so he can complete his Assessment Location of Assessment: [facility] ED TTS Assessment: In system Is this a Tele or Face-to-Face Assessment?: Tele Assessment Is this an Initial Assessment or a Re-assessment for this encounter?: Initial Assessment Patient Accompanied by:: N/A Language Other than English: No Living Arrangements: Other (Comment)(Pt lives with either of his parents at different times) What gender do you identify as?: Male Marital status: Single Maiden name: Bonfield Pregnancy Status: No Living Arrangements: Parent Can pt return to current living arrangement?: Yes Admission Status: Involuntary Petitioner: Family member Is patient capable of signing voluntary admission?: Yes Referral Source: Self/Family/Friend Insurance type: None     Crisis Care Plan Living Arrangements: Parent Legal Guardian: Other:(Self) Name of Psychiatrist: Eilene Ghazi, NP - [facility] [place] Name of Therapist: None  Education Status Is patient currently in school?: No Is the patient employed, unemployed or receiving disability?: Unemployed  Risk to self with the past 6 months Suicidal Ideation: No Has patient been a risk to self within the past 6 months prior to admission? : No Suicidal Intent: No Has patient had any suicidal intent within the past 6 months prior to admission? : No Is patient at risk for suicide?: No Suicidal Plan?: No Has patient had any suicidal plan within the past 6 months prior to admission? : No Access to Means: No What has been your use of drugs/alcohol within the last 12 months?: Pt acknowledges marijuana and LSD  usage Previous Attempts/Gestures: No How many times?: 0 Other Self Harm Risks: Pt is prescribed medication but he doesn't take it Triggers for Past Attempts: None known Intentional Self Injurious Behavior: None Family Suicide History: No Recent stressful life event(s): Conflict (Comment)(Pt has been arguing with his parents) Persecutory voices/beliefs?: No Depression: No Depression Symptoms: Isolating Substance abuse history and/or treatment for substance abuse?: No Suicide prevention information given to non-admitted patients: Not applicable  Risk to Others within the past 6 months Homicidal Ideation: No Does patient have any lifetime risk of violence toward others beyond the six months prior to admission? : No Thoughts of Harm to Others: No Current Homicidal Intent: No Current Homicidal Plan: No Access to Homicidal Means: No Identified Victim: None noted History of harm to others?: No Assessment of Violence: None Noted Violent Behavior Description: None noted Does patient have access to weapons?: No(Pt denies access to weapons/guns) Criminal Charges Pending?: No Does patient have a  court date: No Is patient on probation?: No  Psychosis Hallucinations: (States he's "unsure" as to what other people have seen/see) Delusions: Grandiose  Mental Status Report Appearance/Hygiene: Disheveled Eye Contact: Good Motor Activity: Freedom of movement, Other (Comment)(Pt is sitting up in his hospital bed) Speech: Pressured, Rapid Level of Consciousness: Alert Mood: Anxious Affect: Anxious, Appropriate to circumstance Anxiety Level: Minimal Thought Processes: Circumstantial Judgement: Impaired Orientation: Person, Place, Time, Situation Obsessive Compulsive Thoughts/Behaviors: Moderate  Cognitive Functioning Concentration: Decreased Memory: Recent Intact, Remote Intact Is patient IDD: No Insight: Poor Impulse Control: Poor Appetite: Fair Have you had any weight changes? : No  Change Sleep: No Change Total Hours of Sleep: 7 Vegetative Symptoms: None  ADLScreening [facility] Assessment Services) Patient's cognitive ability adequate to safely complete daily activities?: Yes Patient able to express need for assistance with ADLs?: Yes Independently performs ADLs?: Yes (appropriate for developmental age)  Prior Inpatient Therapy Prior Inpatient Therapy: Yes Prior Therapy Dates: 2019, 2020 Prior Therapy Facilty/Provider(s): [facility], [facility] Reason for Treatment: MH issues  Prior Outpatient Therapy Prior Outpatient Therapy: No Does patient have an ACCT team?: No Does patient have Intensive In-House Services?  : No Does patient have [facility] services? : No Does patient have P4CC services?: No  ADL Screening (condition at time of admission) Patient's cognitive ability adequate to safely complete daily activities?: Yes Is the patient deaf or have difficulty hearing?: No Does the patient have difficulty seeing, even when wearing glasses/contacts?: No Does the patient have difficulty concentrating, remembering, or making decisions?: Yes Patient able to express need for assistance with ADLs?: Yes Does the patient have difficulty dressing or bathing?: No Independently performs ADLs?: Yes (appropriate for developmental age) Does the patient have difficulty walking or climbing stairs?: No Weakness of Legs: None Weakness of Arms/Hands: None  Home Assistive Devices/Equipment Home Assistive Devices/Equipment: None  Therapy Consults (therapy consults require a physician order) PT Evaluation Needed: No OT Evalulation Needed: No SLP Evaluation Needed: No Abuse/Neglect Assessment (Assessment to be complete while patient is alone) Abuse/Neglect Assessment Can Be Completed: Yes Physical Abuse: Denies Verbal Abuse: Yes, past (Comment)(Pt shares his mother was [facility] when he was growing up) Sexual Abuse: Denies Exploitation of patient/patient's resources: Denies Self-Neglect:  Denies Values / Beliefs Cultural Requests During Hospitalization: None Spiritual Requests During Hospitalization: None Consults Spiritual Care Consult Needed: No Social Work Consult Needed: No Regulatory affairs officer (For Healthcare) Does Patient Have a Medical Advance Directive?: Unable to assess, patient is non-responsive or altered mental status         Disposition: Anette Riedel, NP, reviewed pt's chart and information and determined pt should be observed overnight and re-assessed later today. This information was provided to pt's nurse, [place], at [phone].   Disposition Initial Assessment Completed for this Encounter: Yes Patient referred to: Other (Comment)(Pt will be observed overnight for safety and stability)  This service was provided via telemedicine using a 2-way, interactive audio and video technology.  Names of all persons participating in this telemedicine service and their role in this encounter. Name: Isaiah Garza Role: Patient  Name: Anette Riedel Role: Nurse Practitioner  Name: Windell Hummingbird Role: Clinician    Dannielle Burn 07/18/2019 6:36 AM

## 2019-07-18 NOTE — ED Notes (Signed)
Pt refusing Cogentin and lamictal, states that he is on Vraylar 4.5 mg for bipolar and that he has been on it for about a year. States that he takes it once a day and has been for about a year. States that Eligah East [facility].

## 2019-07-18 NOTE — ED Notes (Signed)
Spoke with [facility], pt is being reviewed by [facility] for possible placement. [place] will review this RN's note about pts medication and make recommendations as needed.

## 2019-07-18 NOTE — ED Notes (Addendum)
[facility] contacted this RN and stated patient has been accepted at [facility] in [place], [place] by provider Nelida Gores to room 312 bed 2, contact number for facility is [phone].  Staff at facility would like update in the morning on possible ETA once sheriff's dept is contacted for transport.

## 2019-07-18 NOTE — ED Notes (Signed)
Veggie Diet was ordered for Lunch.

## 2019-07-18 NOTE — ED Notes (Addendum)
Meal tray delivered to pt.  Pt upset about what was delivered for breakfast. Pt given supplemental snacks to go with breakfast. This RN will ensure that a larger vegetarian meal is ordered for lunch for pt.

## 2019-07-18 NOTE — ED Notes (Signed)
Denies SI/HI, denies hallucinations.

## 2019-07-18 NOTE — ED Notes (Signed)
Pts father concerned about pts treatment at [facility]. This RN ensured the pt is going to be admitted and we are waiting for a bed. This RN referred father to [facility].

## 2019-07-18 NOTE — Care Management (Signed)
  Under review [place], [place], [place], [place], [place],  [place], [place], [place], [place], [place], [place], [place], [place], [place], [place], [place], [place], [place], [place], [place], [place], [place], [place], [place], [place], [place], [place], [place], [facility], [place], [place], [place]

## 2019-07-19 MED ORDER — DOXYCYCLINE HYCLATE 100 MG PO CAPS: 100.0000 mg | ORAL_CAPSULE | Freq: Two times a day (BID) | ORAL | 0 refills | Status: AC

## 2019-07-19 NOTE — ED Notes (Signed)
Breakfast ordered 

## 2019-07-19 NOTE — ED Provider Notes (Signed)
28 year old male here for psychiatric evaluation.  I was informed by nursing staff that patient is complaining he has shingles to his left armpit and left groin area.  I evaluated the patient.  He appears to have a papular erythematous rash to the left axilla and bilateral groin area.  He has healing scars to the right axilla where he states he was diagnosed with shingles earlier this month.  He does not have any vesicles.  His prior rash was felt to be consistent with shingles and he was treated with an antiviral medication at that time.  With the presentation today in multiple areas on multiple sides of the body without any evidence of vesicles I have low suspicion for shingles and think that this is more consistent with folliculitis.  Patient case was discussed and patient was evaluated by Dr. Melina Copa, supervising physician, who was in agreement that this is likely folliculitis  Patient started on doxycycline and advised to soap and water to the areas.  He is medically cleared at this time and is appropriate for transfer to behavioral health facility.    Rodney Booze, PA-C 07/19/19 1509    Hayden Rasmussen, MD 07/19/19 [phone]

## 2022-10-15 ENCOUNTER — Encounter (HOSPITAL_BASED_OUTPATIENT_CLINIC_OR_DEPARTMENT_OTHER): Payer: Self-pay | Admitting: Emergency Medicine

## 2022-10-15 ENCOUNTER — Emergency Department (HOSPITAL_BASED_OUTPATIENT_CLINIC_OR_DEPARTMENT_OTHER)
Admission: EM | Admit: 2022-10-15 | Discharge: 2022-10-15 | Disposition: A | Discharge status: Home / Self Care | Payer: Medicaid Other | Attending: Emergency Medicine | Admitting: Emergency Medicine

## 2022-10-15 ENCOUNTER — Other Ambulatory Visit: Payer: Self-pay

## 2022-10-15 DIAGNOSIS — G8929 Other chronic pain: Secondary | ICD-10-CM | POA: Diagnosis not present

## 2022-10-15 DIAGNOSIS — M2550 Pain in unspecified joint: Secondary | ICD-10-CM | POA: Diagnosis present

## 2022-10-15 HISTORY — DX: Ehlers-Danlos syndrome, unspecified: Q79.60

## 2022-10-15 MED ORDER — TRAMADOL HCL 50 MG PO TABS: 50.0000 mg | ORAL_TABLET | Freq: Four times a day (QID) | ORAL | 0 refills | Status: DC | PRN

## 2022-10-15 MED ORDER — TRAMADOL HCL 50 MG PO TABS
50.0000 mg | ORAL_TABLET | Freq: Once | ORAL | Status: AC
  Administered 2022-10-15: 50 mg via ORAL
  Filled 2022-10-15: qty 1

## 2022-10-15 NOTE — ED Triage Notes (Incomplete)
Eh[place] danlos Chronic pain  Visiting from [place], normally takes tramadol 50mg  1-2 tabs daily prn for pain. Has run out and asking for help with meds.

## 2022-10-15 NOTE — ED Provider Notes (Signed)
[facility] Provider Note   CSN: [id number] Arrival date & time: 10/15/22  1926     History  Chief Complaint  Patient presents with   Joint Pain    Isaiah Garza is a 32 y.o. male.  Patient presents to the emergency department for evaluation of acute on chronic joint pain.  Patient has history of Ehlers-Danlos syndrome.  Typically he is on tramadol for more severe pain, Lyrica, ibuprofen, and methocarbamol.  He is currently in the area helping his mother after surgery, typically he resides in [place].  He denies fevers.  Pain is worse in his bilateral knees and much worse in his right hip which is causing him significant difficulty with walking.  Symptoms are consistent with his chronic joint pain.  No lower extremity swelling.         Home Medications Prior to Admission medications   Medication Sig Start Date End Date Taking? Authorizing Provider  traMADol (ULTRAM) 50 MG tablet Take 1 tablet (50 mg total) by mouth every 6 (six) hours as needed. 10/15/22  Yes Carlisle Cater, PA-C  ALPRAZolam Duanne Moron) 1 MG tablet Take 1 mg by mouth 2 (two) times daily as needed for anxiety.    [provider]  amphetamine-dextroamphetamine (ADDERALL) 20 MG tablet Take 20 mg by mouth 3 (three) times daily as needed (for focusing).     [provider]  benztropine (COGENTIN) 0.5 MG tablet Take 1 tablet (0.5 mg total) by mouth 2 (two) times daily. 07/09/19   Rankin, Shuvon B, NP  cariprazine (VRAYLAR) capsule Take 3 mg by mouth daily.    [provider]  diclofenac sodium (VOLTAREN) 1 % GEL Apply 2-4 g topically 4 (four) times daily as needed (to painful sites).     [provider]  Glucosamine-Chondroit-Vit C-Mn (GLUCOSAMINE 1500 COMPLEX PO) Take 1 tablet by mouth daily.    [provider]  lamoTRIgine (LAMICTAL) 25 MG tablet Take 2 tablets (50 mg total) by mouth 2 (two) times daily. 07/09/19   Rankin, Shuvon B, NP   omega-3 acid ethyl esters (LOVAZA) 1 g capsule Take 1 g by mouth daily.    [provider]  valACYclovir (VALTREX) 1000 MG tablet Take 1 tablet (1,000 mg total) by mouth 3 (three) times daily. Patient not taking: Reported on 07/18/2019 07/09/19   Rankin, Shuvon B, NP  risperiDONE (RISPERDAL) 4 MG tablet Take 1 tablet (4 mg total) by mouth at bedtime. Patient not taking: Reported on 07/06/2019 06/06/19 07/06/19  Johnn Hai, MD  temazepam (RESTORIL) 30 MG capsule Take 1 capsule (30 mg total) by mouth at bedtime. Patient not taking: Reported on 07/06/2019 06/06/19 07/06/19  Johnn Hai, MD  trazodone (DESYREL) 300 MG tablet Take 1 tablet (300 mg total) by mouth at bedtime as needed for sleep. Patient not taking: Reported on 07/06/2019 06/06/19 07/06/19  Johnn Hai, MD      Allergies    Risperidone and related, Other, and Tomato    Review of Systems   Review of Systems  Physical Exam Updated Vital Signs BP 131/79 (BP Location: Right Arm)   Pulse 81   Temp 98.3 F (36.8 C) (Oral)   Resp 14   SpO2 100%  Physical Exam Vitals and nursing note reviewed.  Constitutional:      Appearance: He is well-developed.  HENT:     Head: Normocephalic and atraumatic.  Eyes:     Conjunctiva/sclera: Conjunctivae normal.  Pulmonary:     Effort: No  respiratory distress.  Musculoskeletal:        General: No swelling.     Cervical back: Normal range of motion and neck supple.     Right hip: Tenderness present. Normal range of motion.     Left hip: No tenderness. Normal range of motion.     Right knee: No effusion. Normal range of motion.     Left knee: No effusion. Normal range of motion.  Skin:    General: Skin is warm and dry.  Neurological:     Mental Status: He is alert.     ED Results / Procedures / Treatments   Labs (all labs ordered are listed, but only abnormal results are displayed) Labs Reviewed - No data to display  EKG None  Radiology No results  found.  Procedures Procedures    Medications Ordered in ED Medications  traMADol (ULTRAM) tablet 50 mg (50 mg Oral Given 10/15/22 2305)    ED Course/ Medical Decision Making/ A&P    Patient seen and examined. History obtained directly from patient.  I also reviewed some of the patient's recent orthopedic notes from [place] which is consistent with his reported history.    Labs/EKG: None ordered Imaging: None ordered Medications/Fluids: Ordered: Tramadol 50 mg p.o. x 1.    Most recent vital signs reviewed and are as follows: BP 131/79 (BP Location: Right Arm)   Pulse 81   Temp 98.3 F (36.8 C) (Oral)   Resp 14   SpO2 100%   Initial impression: Acute on chronic joint pain, consistent with previous per his history  Home treatment plan: Continued use of his previously prescribed medications. Will give tramadol 50mg  #10 tabs.   Patient counseled on use of narcotic pain medications. Counseled not to combine these medications with others containing tylenol. Urged not to drink alcohol, drive, or perform any other activities that requires focus while taking these medications. The patient verbalizes understanding and agrees with the plan.  Return instructions discussed with patient: Worsening symptoms or other concerns  Follow-up instructions discussed with patient: Follow-up with his care team when able                            Medical Decision Making Risk Prescription drug management.   Chronic joint pain, out of tramadol.  Patient's history is confirmed by notes in epic.  Will give short course of medication.  Low concern for septic arthritis.  Vital signs are reassuring.  The patient's vital signs, pertinent lab work and imaging were reviewed and interpreted as discussed in the ED course. Hospitalization was considered for further testing, treatments, or serial exams/observation. However as patient is well-appearing, has a stable exam, and reassuring studies today, I do not  feel that they warrant admission at this time. This plan was discussed with the patient who verbalizes agreement and comfort with this plan and seems reliable and able to return to the Emergency Department with worsening or changing symptoms.          Final Clinical Impression(s) / ED Diagnoses Final diagnoses:  Arthralgia, unspecified joint    Rx / DC Orders ED Discharge Orders          Ordered    traMADol (ULTRAM) 50 MG tablet  Every 6 hours PRN        10/15/22 2258              2259, PA-C 10/15/22 [address], [place],  MD 10/16/22 0021

## 2022-10-20 ENCOUNTER — Ambulatory Visit: Payer: Self-pay | Admitting: *Deleted

## 2022-10-20 ENCOUNTER — Other Ambulatory Visit: Payer: Self-pay

## 2022-10-20 ENCOUNTER — Ambulatory Visit: Payer: Self-pay

## 2022-10-20 ENCOUNTER — Emergency Department (HOSPITAL_BASED_OUTPATIENT_CLINIC_OR_DEPARTMENT_OTHER)
Admission: EM | Admit: 2022-10-20 | Discharge: 2022-10-20 | Disposition: A | Discharge status: Home / Self Care | Payer: Medicaid Other | Attending: Emergency Medicine | Admitting: Emergency Medicine

## 2022-10-20 ENCOUNTER — Encounter (HOSPITAL_BASED_OUTPATIENT_CLINIC_OR_DEPARTMENT_OTHER): Payer: Self-pay

## 2022-10-20 DIAGNOSIS — M79644 Pain in right finger(s): Secondary | ICD-10-CM | POA: Insufficient documentation

## 2022-10-20 DIAGNOSIS — M79645 Pain in left finger(s): Secondary | ICD-10-CM | POA: Insufficient documentation

## 2022-10-20 DIAGNOSIS — M79675 Pain in left toe(s): Secondary | ICD-10-CM | POA: Insufficient documentation

## 2022-10-20 DIAGNOSIS — M79674 Pain in right toe(s): Secondary | ICD-10-CM | POA: Insufficient documentation

## 2022-10-20 DIAGNOSIS — M25562 Pain in left knee: Secondary | ICD-10-CM | POA: Diagnosis not present

## 2022-10-20 DIAGNOSIS — M25561 Pain in right knee: Secondary | ICD-10-CM | POA: Insufficient documentation

## 2022-10-20 DIAGNOSIS — G8929 Other chronic pain: Secondary | ICD-10-CM

## 2022-10-20 DIAGNOSIS — M549 Dorsalgia, unspecified: Secondary | ICD-10-CM | POA: Diagnosis not present

## 2022-10-20 MED ORDER — METHOCARBAMOL 500 MG PO TABS: 500.0000 mg | ORAL_TABLET | Freq: Two times a day (BID) | ORAL | 0 refills | Status: DC

## 2022-10-20 MED ORDER — DICLOFENAC SODIUM 50 MG PO TBEC: 50.0000 mg | DELAYED_RELEASE_TABLET | Freq: Two times a day (BID) | ORAL | 0 refills | Status: DC

## 2022-10-20 MED ORDER — TRAMADOL HCL 50 MG PO TABS
50.0000 mg | ORAL_TABLET | Freq: Once | ORAL | Status: AC
  Administered 2022-10-20: 50 mg via ORAL
  Filled 2022-10-20: qty 1

## 2022-10-20 NOTE — Telephone Encounter (Signed)
  Chief Complaint: flare-Ehlers-Danlos syndrome  Symptoms: joint pain- flare Frequency: chronic condition causes pain in joints Pertinent Negatives: Patient denies fever- or symptoms Disposition: [] ED /[x] Urgent Care (no appt availability in office) / [] Appointment(In office/virtual)/ []  [facility]/ [] Home Care/ [] Refused Recommended Disposition /[] [facility]/ []  Follow-up with PCP Additional Notes: Patient is previous patient but lives in [place] now and would be considered a new patient at office. Patient is in town caring for his mother and does not have access to refill his medication for his pain- he uses Tramadol. Patient advised UC could be option- but he is not sure they will prescribe his pain medication. Patient plans to be here for at least 2 weeks. Patient advised I would send message to office for any other options for him.

## 2022-10-20 NOTE — Telephone Encounter (Signed)
  Chief Complaint: generalized body and joint pain Symptoms: severe pain to several joints then the pain moves to other areas Pt has Ehlers-Danlos and lives in [place] and does have a PCP but not a rheumatologist Frequency: 3 weeks Pertinent Negatives: Patient denies chest pain, weakness, rash Disposition: [] ED /[] Urgent Care (no appt availability in office) / [] Appointment(In office/virtual)/ []  [facility]/ [] Home Care/ [] Refused Recommended Disposition /[] [facility]/ [x]  Follow-up with PCP Additional Notes: Pt was triaged earlier. Advised pt to call his PCP in [place] and advise of the pain.  Advised pt that controlled substances cannot be prescribed across state lines. Advised if get worse to go to ED. Reason for Disposition  [1] SEVERE pain (e.g., excruciating, unable to do any normal activities) AND [2] not improved 2 hours after pain medicine  Answer Assessment - Initial Assessment Questions 1. ONSET: "When did the muscle aches or body pains start?"      2 weeks 2. LOCATION: "What part of your body is hurting?" (e.g., entire body, arms, legs)      All joints hurt at different times  3. SEVERITY: "How bad is the pain?" (Scale 1-10; or mild, moderate, severe)   - MILD (1-3): doesn't interfere with normal activities    - MODERATE (4-7): interferes with normal activities or awakens from sleep    - SEVERE (8-10):  excruciating pain, unable to do any normal activities      severe 4. CAUSE: "What do you think is causing the pains?"   Ehlers-Danlos excerbation 5. FEVER: "Have you been having fever?"     no 6. OTHER SYMPTOMS: "Do you have any other symptoms?" (e.g., chest pain, weakness, rash, cold or flu symptoms, weight loss)     no 7. PREGNANCY: "Is there any chance you are pregnant?" "When was your last menstrual period?"     N/a 8. TRAVEL: "Have you traveled out of the country in the last month?" (e.g., travel history, exposures)     Lives in [place]  Protocols  used: Muscle Aches and Body Pain-A-AH

## 2022-10-20 NOTE — ED Triage Notes (Signed)
He c/o multiple (too numerous to mention) areas of non-traumatic chronic pain. He is ambulatory and in no distress. He tells me he is from [place] and is visiting his mother, who "just had surgery".

## 2022-10-20 NOTE — Telephone Encounter (Signed)
Left message advising pt.   Thanks,   -Montague Corella  

## 2022-10-20 NOTE — Discharge Instructions (Addendum)
Unfortunately we are unable to provide a refill of your chronic pain medication.  Recommend continue with your methocarbamol and gabapentin.  Can try adding Diclofenac for pain relief.

## 2022-10-20 NOTE — ED Provider Notes (Addendum)
[facility] Provider Note   CSN: [id number] Arrival date & time: 10/20/22  1538     History  Chief Complaint  Patient presents with   multiple areas of Pain    Isaiah Garza is a 32 y.o. male.  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  Patient states that he has recently flown into town to care for his mother who had surgery.  Patient resides in [place], receives care and management of his condition in [place].  Patient was seen here in the ER on 10/15/2022, provided with 10 tablets of tramadol.  Patient tried to arrange for follow-up for refill of this medication however was unable to obtain follow-up so he went to urgent care who guided patient to the ER.  He reports acute on chronic pain, no changes in his baseline pain pattern, no recent falls or injuries, denies redness or swelling of his joints.  Reports pain in his back, knees, ankles, toes and fingers.       Home Medications Prior to Admission medications   Medication Sig Start Date End Date Taking? Authorizing Provider  diclofenac (VOLTAREN) 50 MG EC tablet Take 1 tablet (50 mg total) by mouth 2 (two) times daily. 10/20/22  Yes Tacy Learn, PA-C  methocarbamol (ROBAXIN) 500 MG tablet Take 1 tablet (500 mg total) by mouth 2 (two) times daily. 10/20/22  Yes Tacy Learn, PA-C  ALPRAZolam Duanne Moron) 1 MG tablet Take 1 mg by mouth 2 (two) times daily as needed for anxiety.    [provider]  amphetamine-dextroamphetamine (ADDERALL) 20 MG tablet Take 20 mg by mouth 3 (three) times daily as needed (for focusing).     [provider]  benztropine (COGENTIN) 0.5 MG tablet Take 1 tablet (0.5 mg total) by mouth 2 (two) times daily. 07/09/19   Rankin, Shuvon B, NP  cariprazine (VRAYLAR) capsule Take 3 mg by mouth daily.    [provider]  diclofenac sodium (VOLTAREN) 1 % GEL Apply 2-4 g topically 4 (four) times daily as  needed (to painful sites).     [provider]  Glucosamine-Chondroit-Vit C-Mn (GLUCOSAMINE 1500 COMPLEX PO) Take 1 tablet by mouth daily.    [provider]  lamoTRIgine (LAMICTAL) 25 MG tablet Take 2 tablets (50 mg total) by mouth 2 (two) times daily. 07/09/19   Rankin, Shuvon B, NP  omega-3 acid ethyl esters (LOVAZA) 1 g capsule Take 1 g by mouth daily.    [provider]  traMADol (ULTRAM) 50 MG tablet Take 1 tablet (50 mg total) by mouth every 6 (six) hours as needed. 10/15/22   Carlisle Cater, PA-C  valACYclovir (VALTREX) 1000 MG tablet Take 1 tablet (1,000 mg total) by mouth 3 (three) times daily. Patient not taking: Reported on 07/18/2019 07/09/19   Rankin, Shuvon B, NP  risperiDONE (RISPERDAL) 4 MG tablet Take 1 tablet (4 mg total) by mouth at bedtime. Patient not taking: Reported on 07/06/2019 06/06/19 07/06/19  Johnn Hai, MD  temazepam (RESTORIL) 30 MG capsule Take 1 capsule (30 mg total) by mouth at bedtime. Patient not taking: Reported on 07/06/2019 06/06/19 07/06/19  Johnn Hai, MD  trazodone (DESYREL) 300 MG tablet Take 1 tablet (300 mg total) by mouth at bedtime as needed for sleep. Patient not taking: Reported on 07/06/2019 06/06/19 07/06/19  Johnn Hai, MD      Allergies    Risperidone and related, Other, and Tomato    Review  of Systems   Review of Systems Negative except as per HPI Physical Exam Updated Vital Signs BP 129/75 (BP Location: Right Arm)   Pulse 81   Temp 98.1 F (36.7 C) (Oral)   Resp 18   Ht 6\' 3"  (1.905 m)   Wt 72.6 kg   SpO2 100%   BMI 20.00 kg/m  Physical Exam Vitals and nursing note reviewed.  Constitutional:      General: He is not in acute distress.    Appearance: He is well-developed. He is not diaphoretic.  HENT:     Head: Normocephalic and atraumatic.  Cardiovascular:     Pulses: Normal pulses.  Pulmonary:     Effort: Pulmonary effort is normal.  Musculoskeletal:        General: No swelling.  Skin:     General: Skin is warm and dry.     Findings: No erythema or rash.  Neurological:     Mental Status: He is alert and oriented to person, place, and time.  Psychiatric:        Behavior: Behavior normal.     ED Results / Procedures / Treatments   Labs (all labs ordered are listed, but only abnormal results are displayed) Labs Reviewed - No data to display  EKG None  Radiology No results found.  Procedures Procedures    Medications Ordered in ED Medications  traMADol (ULTRAM) tablet 50 mg (has no administration in time range)    ED Course/ Medical Decision Making/ A&P                             Medical Decision Making Risk Prescription drug management.   32 year old male with concern for acute on chronic pain as above.  Patient reports he will be in town for at least another week, has not booked a return flight and is seeking refill of his tramadol.  He reports taking his gabapentin and methocarbamol without control of his pain.  Records reviewed, discussed with patient due to controlled substance prescribing policies, as this medication we are unable to refill in the emergency room.  Will try diclofenac.  Provided with 1 dose of tramadol prior to discharge.  Encouraged to follow-up with primary care.        Final Clinical Impression(s) / ED Diagnoses Final diagnoses:  Other chronic pain    Rx / DC Orders ED Discharge Orders          Ordered    methocarbamol (ROBAXIN) 500 MG tablet  2 times daily        10/20/22 1618    diclofenac (VOLTAREN) 50 MG EC tablet  2 times daily        10/20/22 1624              Tacy Learn, PA-C 10/20/22 1622    Tacy Learn, PA-C 10/20/22 1625    Regan Lemming, MD 10/20/22 1630

## 2022-10-20 NOTE — Telephone Encounter (Signed)
Reason for Disposition . Muscle aches are a chronic symptom (recurrent or ongoing AND present > 4 weeks)  Answer Assessment - Initial Assessment Questions 1. ONSET: "When did the muscle aches or body pains start?"      Chronic- flares 2. LOCATION: "What part of your body is hurting?" (e.g., entire body, arms, legs)      Pain in fingers, wrist, left shoulder and hip 3. SEVERITY: "How bad is the pain?" (Scale 1-10; or mild, moderate, severe)   - MILD (1-3): doesn't interfere with normal activities    - MODERATE (4-7): interferes with normal activities or awakens from sleep    - SEVERE (8-10):  excruciating pain, unable to do any normal activities      Mild- moderate 4. CAUSE: "What do you think is causing the pains?"     Chronic condition-Ehlers-Danlos syndrome 5. FEVER: "Have you been having fever?"     no 6. OTHER SYMPTOMS: "Do you have any other symptoms?" (e.g., chest pain, weakness, rash, cold or flu symptoms, weight loss)     no  Protocols used: Muscle Aches and Body Pain-A-AH

## 2023-09-21 ENCOUNTER — Encounter (HOSPITAL_BASED_OUTPATIENT_CLINIC_OR_DEPARTMENT_OTHER): Payer: Self-pay | Admitting: Emergency Medicine

## 2023-09-21 ENCOUNTER — Emergency Department (HOSPITAL_BASED_OUTPATIENT_CLINIC_OR_DEPARTMENT_OTHER)
Admission: EM | Admit: 2023-09-21 | Discharge: 2023-09-21 | Disposition: A | Discharge status: Home / Self Care | Payer: Medicaid Other | Attending: Emergency Medicine | Admitting: Emergency Medicine

## 2023-09-21 ENCOUNTER — Other Ambulatory Visit: Payer: Self-pay

## 2023-09-21 DIAGNOSIS — J019 Acute sinusitis, unspecified: Secondary | ICD-10-CM | POA: Insufficient documentation

## 2023-09-21 DIAGNOSIS — H9201 Otalgia, right ear: Secondary | ICD-10-CM | POA: Diagnosis present

## 2023-09-21 MED ORDER — AMOXICILLIN-POT CLAVULANATE 875-125 MG PO TABS: 1.0000 | ORAL_TABLET | Freq: Two times a day (BID) | ORAL | 0 refills | Status: DC

## 2023-09-21 NOTE — ED Triage Notes (Signed)
 Right ear hurting /has been treated recently for ear infection, and left ear hurting and is congested in his head. No fevers

## 2023-09-21 NOTE — ED Provider Notes (Signed)
 [facility] AT [facility] Provider Note   CSN: [id number] Arrival date & time: 09/21/23  1609     History  No chief complaint on file.   Isaiah Garza is a 33 y.o. male.  The history is provided by the patient and medical records. No language interpreter was used.     33 year old male significant history of bipolar, ADHD, anxiety presenting with complaint of ear pain.  Patient states several weeks ago he developed right ear pain.  He was seen and evaluated by a specialist and was prescribed Augmentin  for 10 days for which he is finishing his symptom seems to improve moderately however for the past 2 to 3 days he noticed pain to his left ear as well as a sinus that felt similar to prior ear infection.  No fever or chills no hearing changes no trauma no sore throat and no other treatment tried.  Home Medications Prior to Admission medications   Medication Sig Start Date End Date Taking? Authorizing Provider  ALPRAZolam (XANAX) 1 MG tablet Take 1 mg by mouth 2 (two) times daily as needed for anxiety.    [provider]  amphetamine -dextroamphetamine  (ADDERALL) 20 MG tablet Take 20 mg by mouth 3 (three) times daily as needed (for focusing).     [provider]  benztropine  (COGENTIN ) 0.5 MG tablet Take 1 tablet (0.5 mg total) by mouth 2 (two) times daily. 07/09/19   Rankin, Shuvon B, NP  cariprazine  (VRAYLAR ) capsule Take 3 mg by mouth daily.    [provider]  diclofenac  (VOLTAREN ) 50 MG EC tablet Take 1 tablet (50 mg total) by mouth 2 (two) times daily. 10/20/22   Beverley Leita LABOR, PA-C  diclofenac  sodium (VOLTAREN ) 1 % GEL Apply 2-4 g topically 4 (four) times daily as needed (to painful sites).     [provider]  Glucosamine-Chondroit-Vit C-Mn (GLUCOSAMINE 1500 COMPLEX PO) Take 1 tablet by mouth daily.    [provider]  lamoTRIgine  (LAMICTAL ) 25 MG tablet Take 2 tablets (50 mg total) by mouth 2 (two) times  daily. 07/09/19   Rankin, Shuvon B, NP  methocarbamol  (ROBAXIN ) 500 MG tablet Take 1 tablet (500 mg total) by mouth 2 (two) times daily. 10/20/22   Beverley Leita LABOR, PA-C  omega-3 acid ethyl esters (LOVAZA ) 1 g capsule Take 1 g by mouth daily.    [provider]  traMADol  (ULTRAM ) 50 MG tablet Take 1 tablet (50 mg total) by mouth every 6 (six) hours as needed. 10/15/22   Geiple, Joshua, PA-C  valACYclovir  (VALTREX ) 1000 MG tablet Take 1 tablet (1,000 mg total) by mouth 3 (three) times daily. Patient not taking: Reported on 07/18/2019 07/09/19   Rankin, Shuvon B, NP  risperiDONE  (RISPERDAL ) 4 MG tablet Take 1 tablet (4 mg total) by mouth at bedtime. Patient not taking: Reported on 07/06/2019 06/06/19 07/06/19  Malinda Rogue, MD  temazepam  (RESTORIL ) 30 MG capsule Take 1 capsule (30 mg total) by mouth at bedtime. Patient not taking: Reported on 07/06/2019 06/06/19 07/06/19  Malinda Rogue, MD  trazodone  (DESYREL ) 300 MG tablet Take 1 tablet (300 mg total) by mouth at bedtime as needed for sleep. Patient not taking: Reported on 07/06/2019 06/06/19 07/06/19  Malinda Rogue, MD      Allergies    Risperidone  and related and Other    Review of Systems   Review of Systems  All other systems reviewed and are negative.   Physical Exam Updated Vital Signs BP 121/78  Pulse 92   Temp 98.2 F (36.8 C) (Oral)   Resp 20   SpO2 100%  Physical Exam Vitals and nursing note reviewed.  Constitutional:      General: He is not in acute distress.    Appearance: He is well-developed.  HENT:     Head: Atraumatic.     Comments: Mild tenderness to percussion of the frontal sinuses.    Ears:     Comments: Right ear: TM is opaque with yellowish effusion normal TM no foreign body noted.  Left ear: Normal TM normal ear canal   Eyes:     Conjunctiva/sclera: Conjunctivae normal.  Musculoskeletal:     Cervical back: Neck supple.  Skin:    Findings: No rash.  Neurological:     Mental Status: He is  alert.     ED Results / Procedures / Treatments   Labs (all labs ordered are listed, but only abnormal results are displayed) Labs Reviewed - No data to display  EKG None  Radiology No results found.  Procedures Procedures    Medications Ordered in ED Medications - No data to display  ED Course/ Medical Decision Making/ A&P                                 Medical Decision Making  BP 121/78   Pulse 92   Temp 98.2 F (36.8 C) (Oral)   Resp 20   SpO2 100%   46:58 PM  33 year old male significant history of bipolar, ADHD, anxiety presenting with complaint of ear pain.  Patient states several weeks ago he developed right ear pain.  He was seen and evaluated by a specialist and was prescribed Augmentin  for 10 days for which he is finishing his symptom seems to improve moderately however for the past 2 to 3 days he noticed pain to his left ear as well as a sinus that felt similar to prior ear infection.  No fever or chills no hearing changes no trauma no sore throat and no other treatment tried.  Exam notable for effusion noted to right TM.  Left TM normal.  Some tenderness to percussion's of the sinuses.  Since patient report improvement of symptoms with Augmentin  previously, we will repeat antibiotic.  Patient otherwise well-appearing speaking in complete sentences, vital sign overall reassuring.  Advance imaging including CT scans of the sinus were considered but felt it is low yield.  I encourage patient to follow-up with ENT if no improvement of symptoms.        Final Clinical Impression(s) / ED Diagnoses Final diagnoses:  Acute sinusitis, recurrence not specified, unspecified location    Rx / DC Orders ED Discharge Orders          Ordered    amoxicillin -clavulanate (AUGMENTIN ) 875-125 MG tablet  Every 12 hours        09/21/23 1846              Nivia Colon, PA-C 09/21/23 1847    Zackowski, Scott, MD 09/23/23 2330

## 2023-09-21 NOTE — Discharge Instructions (Addendum)
 Please take antibiotics prescribed as treatment for ear infection and sinus infection.  Follow-up closely with your ENT specialist for outpatient management.

## 2024-08-27 ENCOUNTER — Emergency Department (HOSPITAL_BASED_OUTPATIENT_CLINIC_OR_DEPARTMENT_OTHER)
Admission: EM | Admit: 2024-08-27 | Discharge: 2024-08-27 | Disposition: A | Discharge status: Home / Self Care | Attending: Emergency Medicine | Admitting: Emergency Medicine

## 2024-08-27 ENCOUNTER — Emergency Department (HOSPITAL_BASED_OUTPATIENT_CLINIC_OR_DEPARTMENT_OTHER)

## 2024-08-27 ENCOUNTER — Encounter (HOSPITAL_BASED_OUTPATIENT_CLINIC_OR_DEPARTMENT_OTHER): Payer: Self-pay | Admitting: Emergency Medicine

## 2024-08-27 ENCOUNTER — Other Ambulatory Visit: Payer: Self-pay

## 2024-08-27 DIAGNOSIS — R202 Paresthesia of skin: Secondary | ICD-10-CM

## 2024-08-27 MED ORDER — TRAMADOL HCL 50 MG PO TABS: 50.0000 mg | ORAL_TABLET | Freq: Two times a day (BID) | ORAL | 0 refills | Status: AC | PRN

## 2024-08-27 MED ORDER — TRAMADOL HCL 50 MG PO TABS
50.0000 mg | ORAL_TABLET | Freq: Once | ORAL | Status: AC
  Administered 2024-08-27: 50 mg via ORAL
  Filled 2024-08-27: qty 1

## 2024-08-27 NOTE — ED Provider Notes (Signed)
 [place] EMERGENCY DEPARTMENT AT [facility] Provider Note   CSN: [id number] Arrival date & time: 08/27/24  9572     Patient presents with: leg numbness   Isaiah Garza is a 33 y.o. male.  {Add pertinent medical, surgical, social history, OB history to YEP:67052} The patient presents with new-onset unilateral leg numbness and posterior neck numbness that began approximately an hour after using a handheld massage gun earlier today. Numbness involves the posterior neck, mid-upper back region, buttock, knee (described as completely numb), anterior lower leg with decreased sensation over the top of the foot, and medial leg; mild tingling is reported. No associated back pain, weakness, falls, or trauma. Denies fever, recent GI or respiratory illness. No prior similar episodes of extremity numbness. Past history notable for Ehlers-Danlos syndrome with prior ligamentous instability; no known lumbar/thoracic degenerative disease or prior pinched nerves. Medications: lamotrigine  (for bipolar disorder), clonazepam, Adderall, trazodone , and glucosamine; not currently taking tramadol , valacyclovir , benztropine , cariprazine , diclofenac , or methocarbamol . Recent travel from [place]  to [place] ; notes increased generalized pain since returning to colder weather. History was obtained from the patient.        Prior to Admission medications   Medication Sig Start Date End Date Taking? Authorizing Provider  ALPRAZolam (XANAX) 1 MG tablet Take 1 mg by mouth 2 (two) times daily as needed for anxiety.    [provider]  amphetamine -dextroamphetamine  (ADDERALL) 20 MG tablet Take 20 mg by mouth 3 (three) times daily as needed (for focusing).     [provider]  Glucosamine-Chondroit-Vit C-Mn (GLUCOSAMINE 1500 COMPLEX PO) Take 1 tablet by mouth daily.    [provider]  lamoTRIgine  (LAMICTAL ) 25 MG tablet Take 2 tablets (50 mg total) by mouth 2 (two) times daily.  07/09/19   Rankin, Shuvon B, NP  omega-3 acid ethyl esters (LOVAZA ) 1 g capsule Take 1 g by mouth daily.    [provider]  risperiDONE  (RISPERDAL ) 4 MG tablet Take 1 tablet (4 mg total) by mouth at bedtime. Patient not taking: Reported on 07/06/2019 06/06/19 07/06/19  Malinda Rogue, MD  temazepam  (RESTORIL ) 30 MG capsule Take 1 capsule (30 mg total) by mouth at bedtime. Patient not taking: Reported on 07/06/2019 06/06/19 07/06/19  Malinda Rogue, MD  trazodone  (DESYREL ) 300 MG tablet Take 1 tablet (300 mg total) by mouth at bedtime as needed for sleep. Patient not taking: Reported on 07/06/2019 06/06/19 07/06/19  Malinda Rogue, MD    Allergies: Risperidone  and paliperidone  and Other    Review of Systems  Updated Vital Signs BP 132/80 (BP Location: Right Arm)   Pulse 80   Temp 98.1 F (36.7 C) (Oral)   Resp 18   Ht 6' 3 (1.905 m)   Wt 72.6 kg   SpO2 100%   BMI 20.00 kg/m   Physical Exam  (all labs ordered are listed, but only abnormal results are displayed) Labs Reviewed - No data to display  EKG: None  Radiology: CT Thoracic Spine Wo Contrast Result Date: 08/27/2024 EXAM: CT THORACIC SPINE WITHOUT CONTRAST 08/27/2024 05:06:24 AM TECHNIQUE: CT of the thoracic spine was performed without the administration of intravenous contrast. Multiplanar reformatted images are provided for review. Automated exposure control, iterative reconstruction, and/or weight based adjustment of the mA/kV was utilized to reduce the radiation dose to as low as reasonably achievable. COMPARISON: None available. CLINICAL HISTORY: Radiculopathy. FINDINGS: BONES AND ALIGNMENT: Normal vertebral body heights. No acute fracture or suspicious bone lesion. Normal alignment. DEGENERATIVE CHANGES: There is mild disc  space narrowing and anterior endplate spurring present at T2-T3, T3-T4, T4-T5, T5-T6 and T6-T7. There is no apparent posterior disc herniation, spinal canal or neural foraminal stenosis throughout the  thoracic spine. SOFT TISSUES: No acute abnormality. IMPRESSION: 1. No acute abnormality of the thoracic spine. 2. Mild multilevel degenerative changes with disc space narrowing and anterior endplate spurring at T2-3, T3-4, T4-5, T5-6, and T6-7. Electronically signed by: Evalene Coho MD 08/27/2024 05:11 AM EST RP Workstation: [id number]   CT Lumbar Spine Wo Contrast Result Date: 08/27/2024 EXAM: CT OF THE LUMBAR SPINE WITHOUT CONTRAST 08/27/2024 05:06:24 AM TECHNIQUE: CT of the lumbar spine was performed without the administration of intravenous contrast. Multiplanar reformatted images are provided for review. Automated exposure control, iterative reconstruction, and/or weight based adjustment of the mA/kV was utilized to reduce the radiation dose to as low as reasonably achievable. COMPARISON: None available. CLINICAL HISTORY: radiculopathy FINDINGS: BONES AND ALIGNMENT: Normal vertebral body heights. No acute fracture or suspicious bone lesion. There is mild levoscoliosis of the lumbar spine. DEGENERATIVE CHANGES: The disc spaces are well preserved and the spinal canal and neural foramina are patent. SOFT TISSUES: No acute abnormality. IMPRESSION: 1. No acute findings. 2. Mild levoscoliosis of the lumbar spine. Electronically signed by: Evalene Coho MD 08/27/2024 05:09 AM EST RP Workstation: [id number]    {Document cardiac monitor, telemetry assessment procedure when appropriate:32947} Procedures   Medications Ordered in the ED  traMADol  (ULTRAM ) tablet 50 mg (50 mg Oral Given 08/27/24 0505)      {Click here for ABCD2, HEART and other calculators REFRESH Note before signing:1}                              Medical Decision Making Amount and/or Complexity of Data Reviewed Radiology: ordered.  Risk Prescription drug management.   ***  {Document critical care time when appropriate  Document review of labs and clinical decision tools ie CHADS2VASC2, etc  Document your independent  review of radiology images and any outside records  Document your discussion with family members, caretakers and with consultants  Document social determinants of health affecting pt's care  Document your decision making why or why not admission, treatments were needed:32947:::1}   Final diagnoses:  None    ED Discharge Orders     None

## 2024-08-27 NOTE — ED Triage Notes (Signed)
  Patient comes in with L leg numbness that has been going on for the past 24 hours.  Patient has hx of Ehlers-Danlos disease and states he was using massage gun on his jaw, then started having numbness in L leg.  Endorses numbness from buttock to foot but only in certain spots.  Denies any pain.  Strong pedal pulses.  Cap refill < 3 seconds.  No issues ambulating from lobby to room.
